# Patient Record
Sex: Female | Born: 1945
Health system: Southern US, Community
[De-identification: ages and names within clinical notes are randomized; demographics above are authoritative.]

## PROBLEM LIST (undated history)

## (undated) DIAGNOSIS — M545 Low back pain, unspecified: Secondary | ICD-10-CM

## (undated) DIAGNOSIS — I83893 Varicose veins of bilateral lower extremities with other complications: Secondary | ICD-10-CM

## (undated) DIAGNOSIS — I2510 Atherosclerotic heart disease of native coronary artery without angina pectoris: Secondary | ICD-10-CM

## (undated) DIAGNOSIS — H269 Unspecified cataract: Secondary | ICD-10-CM

## (undated) DIAGNOSIS — M8580 Other specified disorders of bone density and structure, unspecified site: Secondary | ICD-10-CM

## (undated) DIAGNOSIS — K529 Noninfective gastroenteritis and colitis, unspecified: Secondary | ICD-10-CM

## (undated) DIAGNOSIS — D120 Benign neoplasm of cecum: Secondary | ICD-10-CM

## (undated) DIAGNOSIS — R519 Headache, unspecified: Secondary | ICD-10-CM

## (undated) DIAGNOSIS — M1990 Unspecified osteoarthritis, unspecified site: Secondary | ICD-10-CM

## (undated) DIAGNOSIS — G2581 Restless legs syndrome: Secondary | ICD-10-CM

## (undated) DIAGNOSIS — K7460 Unspecified cirrhosis of liver: Secondary | ICD-10-CM

## (undated) DIAGNOSIS — K52831 Collagenous colitis: Secondary | ICD-10-CM

## (undated) DIAGNOSIS — Z972 Presence of dental prosthetic device (complete) (partial): Secondary | ICD-10-CM

## (undated) DIAGNOSIS — K5792 Diverticulitis of intestine, part unspecified, without perforation or abscess without bleeding: Secondary | ICD-10-CM

## (undated) DIAGNOSIS — I728 Aneurysm of other specified arteries: Secondary | ICD-10-CM

## (undated) DIAGNOSIS — J4520 Mild intermittent asthma, uncomplicated: Secondary | ICD-10-CM

## (undated) DIAGNOSIS — G4452 New daily persistent headache (NDPH): Secondary | ICD-10-CM

## (undated) DIAGNOSIS — K621 Rectal polyp: Secondary | ICD-10-CM

## (undated) DIAGNOSIS — R5600 Simple febrile convulsions: Secondary | ICD-10-CM

## (undated) DIAGNOSIS — I700 Atherosclerosis of aorta: Secondary | ICD-10-CM

## (undated) DIAGNOSIS — I517 Cardiomegaly: Secondary | ICD-10-CM

## (undated) DIAGNOSIS — R921 Mammographic calcification found on diagnostic imaging of breast: Secondary | ICD-10-CM

## (undated) DIAGNOSIS — Z8673 Personal history of transient ischemic attack (TIA), and cerebral infarction without residual deficits: Secondary | ICD-10-CM

## (undated) DIAGNOSIS — E785 Hyperlipidemia, unspecified: Secondary | ICD-10-CM

## (undated) HISTORY — DX: Hyperlipidemia, unspecified: E78.5

## (undated) HISTORY — DX: Other specified disorders of bone density and structure, unspecified site: M85.80

## (undated) HISTORY — PX: EYE SURGERY: SHX253

## (undated) HISTORY — DX: Unspecified cataract: H26.9

## (undated) HISTORY — PX: COLONOSCOPY: SHX174

## (undated) HISTORY — DX: Diverticulitis of intestine, part unspecified, without perforation or abscess without bleeding: K57.92

## (undated) HISTORY — PX: ABDOMINAL HYSTERECTOMY: SHX81

## (undated) HISTORY — DX: Restless legs syndrome: G25.81

## (undated) HISTORY — DX: Unspecified osteoarthritis, unspecified site: M19.90

## (undated) HISTORY — PX: TONSILLECTOMY: SUR1361

## (undated) HISTORY — PX: TUBAL LIGATION: SHX77

## (undated) HISTORY — DX: Collagenous colitis: K52.831

---

## 1997-10-04 ENCOUNTER — Other Ambulatory Visit: Admission: RE | Admit: 1997-10-04 | Discharge: 1997-10-04 | Payer: Self-pay | Admitting: Gynecology

## 1998-10-31 ENCOUNTER — Other Ambulatory Visit: Admission: RE | Admit: 1998-10-31 | Discharge: 1998-10-31 | Payer: Self-pay | Admitting: Gynecology

## 1999-12-03 ENCOUNTER — Other Ambulatory Visit: Admission: RE | Admit: 1999-12-03 | Discharge: 1999-12-03 | Payer: Self-pay | Admitting: Gynecology

## 2000-12-14 ENCOUNTER — Other Ambulatory Visit: Admission: RE | Admit: 2000-12-14 | Discharge: 2000-12-14 | Payer: Self-pay | Admitting: Gynecology

## 2001-12-14 ENCOUNTER — Other Ambulatory Visit: Admission: RE | Admit: 2001-12-14 | Discharge: 2001-12-14 | Payer: Self-pay | Admitting: Gynecology

## 2002-03-06 ENCOUNTER — Encounter: Payer: Self-pay | Admitting: Orthopedic Surgery

## 2002-03-06 ENCOUNTER — Ambulatory Visit (HOSPITAL_COMMUNITY): Admission: RE | Admit: 2002-03-06 | Discharge: 2002-03-06 | Payer: Self-pay | Admitting: Orthopedic Surgery

## 2004-09-12 ENCOUNTER — Ambulatory Visit: Payer: Self-pay | Admitting: Unknown Physician Specialty

## 2005-03-13 ENCOUNTER — Ambulatory Visit: Payer: Self-pay | Admitting: Family Medicine

## 2005-08-13 ENCOUNTER — Ambulatory Visit: Payer: Self-pay | Admitting: Family Medicine

## 2005-08-14 ENCOUNTER — Ambulatory Visit: Payer: Self-pay | Admitting: Family Medicine

## 2007-07-01 ENCOUNTER — Ambulatory Visit: Payer: Self-pay | Admitting: Family Medicine

## 2007-10-13 ENCOUNTER — Ambulatory Visit: Payer: Self-pay | Admitting: Family Medicine

## 2010-01-25 ENCOUNTER — Ambulatory Visit: Payer: Self-pay | Admitting: Family Medicine

## 2010-02-07 ENCOUNTER — Ambulatory Visit: Payer: Self-pay | Admitting: Family Medicine

## 2010-02-15 ENCOUNTER — Ambulatory Visit: Payer: Self-pay | Admitting: Unknown Physician Specialty

## 2010-02-20 LAB — PATHOLOGY REPORT

## 2010-04-10 ENCOUNTER — Ambulatory Visit: Payer: Self-pay | Admitting: Unknown Physician Specialty

## 2010-04-11 LAB — PATHOLOGY REPORT

## 2011-06-03 ENCOUNTER — Ambulatory Visit: Payer: Self-pay | Admitting: Unknown Physician Specialty

## 2011-06-09 LAB — PATHOLOGY REPORT

## 2012-06-10 ENCOUNTER — Ambulatory Visit: Payer: Self-pay | Admitting: Family Medicine

## 2012-07-06 ENCOUNTER — Ambulatory Visit: Payer: Self-pay | Admitting: Family Medicine

## 2013-10-21 ENCOUNTER — Ambulatory Visit: Payer: Self-pay | Admitting: Family Medicine

## 2014-07-30 ENCOUNTER — Other Ambulatory Visit: Payer: Self-pay | Admitting: Family Medicine

## 2014-07-30 DIAGNOSIS — J4531 Mild persistent asthma with (acute) exacerbation: Secondary | ICD-10-CM

## 2014-08-10 ENCOUNTER — Encounter: Payer: Self-pay | Admitting: Family Medicine

## 2014-08-10 ENCOUNTER — Ambulatory Visit (INDEPENDENT_AMBULATORY_CARE_PROVIDER_SITE_OTHER): Payer: Medicare PPO | Admitting: Family Medicine

## 2014-08-10 VITALS — BP 120/80 | HR 70 | Ht 67.0 in | Wt 150.0 lb

## 2014-08-10 DIAGNOSIS — J4520 Mild intermittent asthma, uncomplicated: Secondary | ICD-10-CM

## 2014-08-10 DIAGNOSIS — E785 Hyperlipidemia, unspecified: Secondary | ICD-10-CM | POA: Diagnosis not present

## 2014-08-10 DIAGNOSIS — J4531 Mild persistent asthma with (acute) exacerbation: Secondary | ICD-10-CM | POA: Diagnosis not present

## 2014-08-10 DIAGNOSIS — G2581 Restless legs syndrome: Secondary | ICD-10-CM | POA: Diagnosis not present

## 2014-08-10 DIAGNOSIS — Z0000 Encounter for general adult medical examination without abnormal findings: Secondary | ICD-10-CM

## 2014-08-10 LAB — POCT URINALYSIS DIPSTICK
Bilirubin, UA: NEGATIVE
Blood, UA: NEGATIVE
GLUCOSE UA: NEGATIVE
Ketones, UA: NEGATIVE
Leukocytes, UA: NEGATIVE
NITRITE UA: NEGATIVE
Protein, UA: NEGATIVE
Spec Grav, UA: 1.01
UROBILINOGEN UA: 0.2
pH, UA: 5

## 2014-08-10 MED ORDER — LOVASTATIN 20 MG PO TABS: 20.0000 mg | ORAL_TABLET | Freq: Every day | ORAL | Status: DC

## 2014-08-10 MED ORDER — MONTELUKAST SODIUM 10 MG PO TABS: 10.0000 mg | ORAL_TABLET | Freq: Every day | ORAL | Status: DC

## 2014-08-10 NOTE — Progress Notes (Signed)
Name: Caroline Harris   MRN: [id number]    DOB: 06-Nov-1945   Date:08/10/2014       Progress Note  Subjective  Chief Complaint  Chief Complaint  Patient presents with  . Annual Exam  . Hyperlipidemia  . Asthma    Hyperlipidemia This is a recurrent problem. The current episode started more than 1 year ago. The problem is controlled. Recent lipid tests were reviewed and are normal. She has no history of chronic renal disease, diabetes, hypothyroidism, liver disease, obesity or nephrotic syndrome. There are no known factors aggravating her hyperlipidemia. Pertinent negatives include no chest pain, focal sensory loss, focal weakness, leg pain, myalgias or shortness of breath. She is currently on no antihyperlipidemic treatment. The current treatment provides mild improvement of lipids. There are no compliance problems.  There are no known risk factors for coronary artery disease.  Asthma She complains of cough. There is no hemoptysis, shortness of breath or wheezing. Pertinent negatives include no chest pain, ear congestion, ear pain, fever, headaches, heartburn, myalgias, nasal congestion, postnasal drip, rhinorrhea, sore throat, sweats or weight loss. Her past medical history is significant for asthma.  Cough This is a recurrent problem. The current episode started more than 1 year ago. The problem has been gradually improving. The cough is non-productive. Pertinent negatives include no chest pain, chills, ear congestion, ear pain, eye redness, fever, headaches, heartburn, hemoptysis, myalgias, nasal congestion, postnasal drip, rash, rhinorrhea, sore throat, shortness of breath, sweats, weight loss or wheezing. Nothing aggravates the symptoms. She has tried nothing for the symptoms. The treatment provided mild relief. Her past medical history is significant for asthma.    No problem-specific assessment & plan notes found for this encounter.   Past Medical History  Diagnosis Date  . Asthma   .  Hyperlipidemia     History reviewed. No pertinent past surgical history.  History reviewed. No pertinent family history.  History   Social History  . Marital Status: Married    Spouse Name: N/A  . Number of Children: N/A  . Years of Education: N/A   Occupational History  . Not on file.   Social History Main Topics  . Smoking status: Current Every Day Smoker  . Smokeless tobacco: Not on file  . Alcohol Use: Not on file  . Drug Use: Not on file  . Sexual Activity: Not Currently   Other Topics Concern  . Not on file   Social History Narrative  . No narrative on file    No Known Allergies   Review of Systems  Constitutional: Negative for fever, chills and weight loss.  HENT: Negative for ear pain, postnasal drip, rhinorrhea and sore throat.   Eyes: Negative for blurred vision, double vision, photophobia, pain, discharge and redness.  Respiratory: Positive for cough. Negative for hemoptysis, shortness of breath and wheezing.   Cardiovascular: Negative for chest pain.  Gastrointestinal: Negative for heartburn, vomiting, abdominal pain, diarrhea, constipation, blood in stool and melena.  Genitourinary: Negative for dysuria, urgency and frequency.  Musculoskeletal: Negative for myalgias.  Skin: Negative for rash.  Neurological: Negative for dizziness, tingling, tremors, sensory change, focal weakness and headaches.  Endo/Heme/Allergies: Does not bruise/bleed easily.  Psychiatric/Behavioral: Negative for depression. The patient is not nervous/anxious.      Objective  Filed Vitals:   08/10/14 1414  BP: 120/80  Pulse: 70  Height: 5\' 7"  (1.702 m)  Weight: 150 lb (68.04 kg)    Physical Exam  Constitutional: She is well-developed, well-nourished, and in  no distress.  HENT:  Head: Normocephalic and atraumatic.  Right Ear: External ear normal.  Left Ear: External ear normal.  Mouth/Throat: Oropharynx is clear and moist.  Eyes: Conjunctivae and EOM are normal.  Pupils are equal, round, and reactive to light.  Neck: Normal range of motion. Neck supple. No tracheal deviation present. No thyromegaly present.  Cardiovascular: Normal rate, regular rhythm, normal heart sounds and intact distal pulses.   Pulmonary/Chest: Effort normal and breath sounds normal. No respiratory distress. She has no wheezes. She has no rales.  Abdominal: Soft. Bowel sounds are normal. There is no tenderness. There is no rebound.  Musculoskeletal: Normal range of motion. She exhibits no edema.  Lymphadenopathy:    She has no cervical adenopathy.  Skin: Skin is warm and dry.  Psychiatric: Mood and affect normal.      Assessment & Plan  Problem List Items Addressed This Visit    None    Visit Diagnoses    Annual physical exam    -  Primary    Hyperlipidemia        Relevant Medications    lovastatin (MEVACOR) 20 MG tablet    Other Relevant Orders    Lipid Profile    Hepatic function panel    Renal Function Panel         Dr. Otilio Miu [facility]  08/10/2014

## 2014-08-10 NOTE — Progress Notes (Signed)
Patient: Caroline Harris, Female    DOB: July 19, 1945, 69 y.o.   MRN: [id number] Visit Date: 08/10/2014  Today's Provider: Otilio Miu, MD   Chief Complaint  Patient presents with  . Annual Exam  . Hyperlipidemia  . Asthma    Subjective:    Annual wellness visit Caroline Harris is a 69 y.o. female who presents today for her Subsequent Annual Wellness Visit. She feels well. She reports exercising . She reports she is sleeping well.   ----------------------------------------------------------- HPI Comments: Review all aspects maw   Hyperlipidemia This is a recurrent problem. The current episode started more than 1 year ago. The problem is controlled. Recent lipid tests were reviewed and are low. She has no history of chronic renal disease, diabetes, hypothyroidism, liver disease, obesity or nephrotic syndrome. Factors aggravating her hyperlipidemia include smoking. Pertinent negatives include no chest pain, focal sensory loss, focal weakness, leg pain, myalgias or shortness of breath. She is currently on no antihyperlipidemic treatment. The current treatment provides moderate improvement of lipids. There are no compliance problems.  Risk factors for coronary artery disease include dyslipidemia.  Asthma There is no chest tightness, cough, difficulty breathing or shortness of breath. This is a recurrent problem. The current episode started more than 1 year ago. The problem occurs daily. The problem has been gradually improving. Pertinent negatives include no chest pain, fever, headaches, heartburn, myalgias, nasal congestion, PND, postnasal drip, sore throat or weight loss. Her symptoms are aggravated by nothing. Her symptoms are alleviated by ipratropium. She reports moderate improvement on treatment. Risk factors for lung disease include smoking/tobacco exposure. Her past medical history is significant for asthma.    Review of Systems  Constitutional: Negative for fever and weight loss.  HENT:  Negative for postnasal drip and sore throat.   Respiratory: Negative for cough and shortness of breath.   Cardiovascular: Negative for chest pain and PND.  Gastrointestinal: Negative for heartburn.  Musculoskeletal: Negative for myalgias.  Neurological: Negative for focal weakness and headaches.    History   Social History  . Marital Status: Married    Spouse Name: N/A  . Number of Children: N/A  . Years of Education: N/A   Occupational History  . Not on file.   Social History Main Topics  . Smoking status: Current Every Day Smoker  . Smokeless tobacco: Not on file  . Alcohol Use: Not on file  . Drug Use: Not on file  . Sexual Activity: Not Currently   Other Topics Concern  . Not on file   Social History Narrative  . No narrative on file    There are no active problems to display for this patient.   History reviewed. No pertinent past surgical history.  Her family history is not on file.    Previous Medications   ROPINIROLE (REQUIP) 0.25 MG TABLET    Take 1 tablet by mouth at bedtime.    Patient Care Team: Juline Patch, MD as PCP - General (Family Medicine)     Objective:   Vitals: BP 120/80 mmHg  Pulse 70  Ht 5\' 7"  (1.702 m)  Wt 150 lb (68.04 kg)  BMI 23.49 kg/m2  Physical Exam  Constitutional: She is oriented to person, place, and time. She appears well-developed and well-nourished.  HENT:  Head: Normocephalic.  Right Ear: External ear normal.  Left Ear: External ear normal.  Nose: Nose normal.  Eyes: Conjunctivae and EOM are normal. Pupils are equal, round, and reactive to light.  Neck: Normal  range of motion. Neck supple.  Cardiovascular: Normal rate, regular rhythm, normal heart sounds and intact distal pulses.   Pulmonary/Chest: Effort normal and breath sounds normal.  Abdominal: Soft. Bowel sounds are normal.  Genitourinary: Vagina normal and uterus normal.  Musculoskeletal: Normal range of motion.  Neurological: She is alert and oriented  to person, place, and time. She has normal reflexes.  Skin: Skin is warm and dry.  Psychiatric: She has a normal mood and affect. Her behavior is normal. Judgment and thought content normal.    Activities of Daily Living In your present state of health, do you have any difficulty performing the following activities: 08/10/2014  Hearing? N  Vision? N  Difficulty concentrating or making decisions? N  Walking or climbing stairs? N  Dressing or bathing? N  Doing errands, shopping? N    Fall Risk Assessment Fall Risk  08/10/2014  Falls in the past year? No     Patient reports there are not safety devices in place in shower at home.   Depression Screen PHQ 2/9 Scores 08/10/2014  PHQ - 2 Score 0    Cognitive Testing - 6-CIT        Assessment & Plan:     Annual Wellness Visit  Reviewed patient's Family Medical History Reviewed and updated list of patient's medical providers Assessment of cognitive impairment was done Assessed patient's functional ability Established a written schedule for health screening [place] Completed and Reviewed  Exercise Activities and Dietary recommendations Goals    None       There is no immunization history on file for this patient.  Health Maintenance  Topic Date Due  . TETANUS/TDAP  06/28/1963  . MAMMOGRAM  06/28/1994  . COLONOSCOPY  06/28/1994  . ZOSTAVAX  06/27/2004  . DEXA SCAN  06/27/2009  . PNA vac Low Risk Adult (1 of 2 - PCV13) 06/27/2009  . INFLUENZA VACCINE  09/18/2014     Discussed health benefits of physical activity, and encouraged her to engage in regular exercise appropriate for her age and condition.   ------------------------------------------------------------------------------------------------------------   Problem List Items Addressed This Visit    None    Visit Diagnoses    Medicare annual wellness visit, subsequent    -  Primary    Relevant Orders    POCT Urinalysis Dipstick  (Completed)    Hyperlipidemia        Relevant Medications    lovastatin (MEVACOR) 20 MG tablet    Other Relevant Orders    Lipid Profile    Hepatic function panel    Renal Function Panel    Reactive airway disease, mild intermittent, uncomplicated        Relevant Medications    montelukast (SINGULAIR) 10 MG tablet    Asthma, mild persistent, with acute exacerbation        Relevant Medications    montelukast (SINGULAIR) 10 MG tablet        Otilio Miu, MD [facility]  08/10/2014

## 2014-08-11 LAB — HEPATIC FUNCTION PANEL: BILIRUBIN, DIRECT: 0.12 mg/dL (ref 0.00–0.40)

## 2014-08-11 LAB — LIPID PANEL
CHOL/HDL RATIO: 2 ratio (ref 0.0–4.4)
Cholesterol, Total: 185 mg/dL (ref 100–199)
HDL: 93 mg/dL (ref >39)
LDL Calculated: 76 mg/dL (ref 0–99)
TRIGLYCERIDES: 82 mg/dL (ref 0–149)
VLDL Cholesterol Cal: 16 mg/dL (ref 5–40)

## 2014-08-11 LAB — RENAL FUNCTION PANEL: Phosphorus: 4.1 mg/dL (ref 2.5–4.5)

## 2014-10-07 ENCOUNTER — Ambulatory Visit
Admission: EM | Admit: 2014-10-07 | Discharge: 2014-10-07 | Disposition: A | Discharge status: Home / Self Care | Payer: Medicare PPO | Attending: Family Medicine | Admitting: Family Medicine

## 2014-10-07 ENCOUNTER — Encounter: Payer: Self-pay | Admitting: Gynecology

## 2014-10-07 ENCOUNTER — Ambulatory Visit: Payer: Medicare PPO

## 2014-10-07 ENCOUNTER — Other Ambulatory Visit: Payer: Self-pay

## 2014-10-07 DIAGNOSIS — R51 Headache: Secondary | ICD-10-CM | POA: Diagnosis not present

## 2014-10-07 DIAGNOSIS — R519 Headache, unspecified: Secondary | ICD-10-CM

## 2014-10-07 LAB — BASIC METABOLIC PANEL
Anion gap: 9 (ref 5–15)
BUN: 15 mg/dL (ref 6–20)
CO2: 29 mmol/L (ref 22–32)
Calcium: 9.6 mg/dL (ref 8.9–10.3)
Chloride: 96 mmol/L — ABNORMAL LOW (ref 101–111)
Creatinine, Ser: 0.67 mg/dL (ref 0.44–1.00)
GFR calc Af Amer: >60 mL/min (ref >60)
GFR calc non Af Amer: >60 mL/min (ref >60)
Glucose, Bld: 96 mg/dL (ref 65–99)
Potassium: 4.1 mmol/L (ref 3.5–5.1)
Sodium: 134 mmol/L — ABNORMAL LOW (ref 135–145)

## 2014-10-07 LAB — CBC WITH DIFFERENTIAL/PLATELET
Basophils Absolute: 0.1 10*3/uL (ref 0–0.1)
Basophils Relative: 1 %
EOS PCT: 1 %
Eosinophils Absolute: 0.1 10*3/uL (ref 0–0.7)
HEMATOCRIT: 39.5 % (ref 35.0–47.0)
Hemoglobin: 13.6 g/dL (ref 12.0–16.0)
LYMPHS PCT: 27 %
Lymphs Abs: 1.8 10*3/uL (ref 1.0–3.6)
MCH: 31.9 pg (ref 26.0–34.0)
MCHC: 34.5 g/dL (ref 32.0–36.0)
MCV: 92.5 fL (ref 80.0–100.0)
Monocytes Absolute: 0.4 10*3/uL (ref 0.2–0.9)
Monocytes Relative: 5 %
NEUTROS ABS: 4.5 10*3/uL (ref 1.4–6.5)
Neutrophils Relative %: 66 %
PLATELETS: 221 10*3/uL (ref 150–440)
RBC: 4.26 MIL/uL (ref 3.80–5.20)
RDW: 13.3 % (ref 11.5–14.5)
WBC: 6.8 10*3/uL (ref 3.6–11.0)

## 2014-10-07 LAB — URINALYSIS COMPLETE WITH MICROSCOPIC (ARMC ONLY)
BACTERIA UA: NONE SEEN — AB
Bilirubin Urine: NEGATIVE
Glucose, UA: NEGATIVE mg/dL
Hgb urine dipstick: NEGATIVE
Ketones, ur: NEGATIVE mg/dL
Leukocytes, UA: NEGATIVE
Nitrite: NEGATIVE
PROTEIN: NEGATIVE mg/dL
RBC / HPF: NONE SEEN RBC/hpf (ref <3)
Specific Gravity, Urine: 1.01 (ref 1.005–1.030)
pH: 7 (ref 5.0–8.0)

## 2014-10-07 NOTE — ED Notes (Signed)
Patient c/o headache x 1 week.

## 2014-10-07 NOTE — Discharge Instructions (Signed)
If symptoms persist or worsen seek medical attention as discussed. Discussed over-the-counter medications to use for headache.

## 2014-10-07 NOTE — ED Provider Notes (Signed)
Patient presents today after symptoms of a headache for about one week. She states that her headache is about a 4 out of 10 at this time. She denies any slurred speech, nausea, vomiting, chest pain, shortness of breath, vision changes, weakness of one side, fever, myalgias, arthralgias, rash. She denies any upper respiratory symptoms or sinus congestion. She denies any history of migraines. She has been trying to watch her diet and lose weight. She does admit to stress but does not give me specifics. She is a smoker denies any significant alcohol use. She denies any illicit drug use. She denies any tick bites.  ROS: Negative except mentioned above. Vitals as per Epic  GENERAL: NAD HEENT: no pharyngeal erythema, no exudate, no erythema of TMs, no cervical LAD RESP: CTA B CARD: RRR ABD: +BS, NT/ND, no flank tenderness MSK: 5/5 strength of extremities  NEURO: CN II-XII grossly intact, normal speech, -Rombergs, nl affect  A/P:  Headache- labs and CT of the head done today, no significant findings to explain patient's headache, I've asked that she return to her regular diet, smoking cessation encouraged, EKG was done which showed normal sinus rhythm with nonspecific T-wave abnormality, no previous EKG to compare with. Discussed use of Tylenol and Aleve for headache if needed. If symptoms do worsen I have asked that she seek immediate medical attention. She is to follow-up with her primary care provider earlier in the week.   Paulina Fusi, MD 10/07/14 [phone]

## 2014-10-09 ENCOUNTER — Ambulatory Visit (INDEPENDENT_AMBULATORY_CARE_PROVIDER_SITE_OTHER): Payer: Medicare PPO | Admitting: Family Medicine

## 2014-10-09 ENCOUNTER — Encounter: Payer: Self-pay | Admitting: Family Medicine

## 2014-10-09 VITALS — BP 120/82 | HR 72 | Ht 68.0 in | Wt 146.0 lb

## 2014-10-09 DIAGNOSIS — J0110 Acute frontal sinusitis, unspecified: Secondary | ICD-10-CM

## 2014-10-09 DIAGNOSIS — I10 Essential (primary) hypertension: Secondary | ICD-10-CM | POA: Diagnosis not present

## 2014-10-09 DIAGNOSIS — R51 Headache: Secondary | ICD-10-CM

## 2014-10-09 DIAGNOSIS — R519 Headache, unspecified: Secondary | ICD-10-CM

## 2014-10-09 MED ORDER — AZITHROMYCIN 250 MG PO TABS: ORAL_TABLET | ORAL | Status: DC

## 2014-10-09 NOTE — Progress Notes (Signed)
Name: Caroline Harris   MRN: [id number]    DOB: 1945/08/13   Date:10/09/2014       Progress Note  Subjective  Chief Complaint  Chief Complaint  Patient presents with  . Follow-up    went in with HA- all tests and labs were normal except sodium- taking extra strength Tylenol for HA    Sinusitis This is a recurrent problem. The current episode started more than 1 year ago. The problem has been waxing and waning since onset. There has been no fever. Her pain is at a severity of 4/10. The pain is moderate. Associated symptoms include headaches and sinus pressure. Pertinent negatives include no chills, congestion, coughing, diaphoresis, ear pain, hoarse voice, neck pain, shortness of breath, sneezing, sore throat or swollen glands. Past treatments include acetaminophen. The treatment provided mild relief.  Headache  This is a recurrent problem. The current episode started more than 1 year ago. The problem has been waxing and waning. The pain is located in the bilateral and frontal (occ postterior cervical) region. The pain quality is similar to prior headaches. The quality of the pain is described as throbbing. Associated symptoms include sinus pressure. Pertinent negatives include no abdominal pain, back pain, blurred vision, coughing, dizziness, ear pain, eye watering, fever, hearing loss, insomnia, muscle aches, nausea, neck pain, phonophobia, photophobia, rhinorrhea, sore throat, swollen glands, tingling, tinnitus, visual change or weight loss. Nothing aggravates the symptoms. She has tried acetaminophen for the symptoms. Her past medical history is significant for hypertension and sinus disease. There is no history of cancer, cluster headaches, migraine headaches, obesity, recent head traumas or TMJ.    No problem-specific assessment & plan notes found for this encounter.   Past Medical History  Diagnosis Date  . Asthma   . Hyperlipidemia   . Hypertension   . Restless leg     Past Surgical  History  Procedure Laterality Date  . Abdominal hysterectomy    . Tonsillectomy    . Colonoscopy      History reviewed. No pertinent family history.  Social History   Social History  . Marital Status: Married    Spouse Name: N/A  . Number of Children: N/A  . Years of Education: N/A   Occupational History  . Not on file.   Social History Main Topics  . Smoking status: Current Every Day Smoker  . Smokeless tobacco: Not on file  . Alcohol Use: 0.6 oz/week    1 Glasses of wine per week  . Drug Use: No  . Sexual Activity: Not Currently   Other Topics Concern  . Not on file   Social History Narrative    No Known Allergies   Review of Systems  Constitutional: Negative for fever, chills, weight loss, malaise/fatigue and diaphoresis.  HENT: Positive for sinus pressure. Negative for congestion, ear discharge, ear pain, hearing loss, hoarse voice, rhinorrhea, sneezing, sore throat and tinnitus.   Eyes: Negative for blurred vision and photophobia.  Respiratory: Negative for cough, sputum production, shortness of breath and wheezing.   Cardiovascular: Negative for chest pain, palpitations and leg swelling.  Gastrointestinal: Negative for heartburn, nausea, abdominal pain, diarrhea, constipation, blood in stool and melena.  Genitourinary: Negative for dysuria, urgency, frequency and hematuria.  Musculoskeletal: Negative for myalgias, back pain, joint pain and neck pain.  Skin: Negative for rash.  Neurological: Positive for headaches. Negative for dizziness, tingling, sensory change and focal weakness.  Endo/Heme/Allergies: Negative for environmental allergies and polydipsia. Does not bruise/bleed easily.  Psychiatric/Behavioral:  Negative for depression and suicidal ideas. The patient is not nervous/anxious and does not have insomnia.      Objective  Filed Vitals:   10/09/14 1444  BP: 120/82  Pulse: 72  Height: 5\' 8"  (1.727 m)  Weight: 146 lb (66.225 kg)    Physical  Exam  Constitutional: She is well-developed, well-nourished, and in no distress. No distress.  HENT:  Head: Normocephalic and atraumatic.  Right Ear: External ear normal.  Left Ear: External ear normal.  Nose: Nose normal.  Mouth/Throat: Oropharynx is clear and moist.  Eyes: Conjunctivae and EOM are normal. Pupils are equal, round, and reactive to light. Right eye exhibits no discharge. Left eye exhibits no discharge.  Neck: Normal range of motion. Neck supple. No JVD present. No thyromegaly present.  Cardiovascular: Normal rate, regular rhythm, normal heart sounds and intact distal pulses.  Exam reveals no gallop and no friction rub.   No murmur heard. Pulmonary/Chest: Effort normal and breath sounds normal.  Abdominal: Soft. Bowel sounds are normal. She exhibits no mass. There is no tenderness. There is no guarding.  Musculoskeletal: Normal range of motion. She exhibits no edema.  Lymphadenopathy:    She has no cervical adenopathy.  Neurological: She is alert. She has normal sensation, normal strength, normal reflexes and intact cranial nerves.  Skin: Skin is warm and dry. She is not diaphoretic.  Psychiatric: Mood and affect normal.      Assessment & Plan  Problem List Items Addressed This Visit    None        Dr. Otilio Miu [facility]  10/09/2014

## 2014-12-25 ENCOUNTER — Encounter: Payer: Self-pay | Admitting: Family Medicine

## 2014-12-25 ENCOUNTER — Ambulatory Visit (INDEPENDENT_AMBULATORY_CARE_PROVIDER_SITE_OTHER): Payer: Medicare PPO | Admitting: Family Medicine

## 2014-12-25 VITALS — BP 110/60 | HR 68 | Temp 98.0°F | Ht 68.0 in | Wt 150.0 lb

## 2014-12-25 DIAGNOSIS — J0100 Acute maxillary sinusitis, unspecified: Secondary | ICD-10-CM

## 2014-12-25 MED ORDER — GUAIFENESIN-CODEINE 100-10 MG/5ML PO SOLN: 5.0000 mL | Freq: Three times a day (TID) | ORAL | Status: DC | PRN

## 2014-12-25 MED ORDER — AMOXICILLIN 500 MG PO CAPS: 500.0000 mg | ORAL_CAPSULE | Freq: Three times a day (TID) | ORAL | Status: DC

## 2014-12-25 NOTE — Progress Notes (Signed)
Name: Caroline Harris   MRN: [id number]    DOB: 15-Sep-1945   Date:12/25/2014       Progress Note  Subjective  Chief Complaint  Chief Complaint  Patient presents with  . Sinusitis    facial pressure- taking Mucinex x 5 days- not helping  . Cough    Sinusitis This is a chronic problem. The current episode started in the past 7 days. The problem is unchanged. There has been no fever. The pain is moderate (maxillary). Associated symptoms include coughing. Pertinent negatives include no chills, congestion, diaphoresis, ear pain, headaches, hoarse voice, neck pain, shortness of breath, sinus pressure, sneezing, sore throat or swollen glands. Past treatments include acetaminophen. The treatment provided mild relief.  Cough This is a recurrent problem. The current episode started more than 1 month ago. The problem has been waxing and waning. The cough is non-productive. Pertinent negatives include no chest pain, chills, ear pain, fever, headaches, heartburn, hemoptysis, myalgias, nasal congestion, postnasal drip, rash, rhinorrhea, sore throat, shortness of breath, weight loss or wheezing. The treatment provided mild relief. There is no history of asthma, bronchiectasis, bronchitis, COPD, emphysema, environmental allergies or pneumonia.    No problem-specific assessment & plan notes found for this encounter.   Past Medical History  Diagnosis Date  . Asthma   . Hyperlipidemia   . Hypertension   . Restless leg     Past Surgical History  Procedure Laterality Date  . Abdominal hysterectomy    . Tonsillectomy    . Colonoscopy      History reviewed. No pertinent family history.  Social History   Social History  . Marital Status: Married    Spouse Name: N/A  . Number of Children: N/A  . Years of Education: N/A   Occupational History  . Not on file.   Social History Main Topics  . Smoking status: Current Every Day Smoker  . Smokeless tobacco: Not on file  . Alcohol Use: 0.6 oz/week     1 Glasses of wine per week  . Drug Use: No  . Sexual Activity: Not Currently   Other Topics Concern  . Not on file   Social History Narrative    No Known Allergies   Review of Systems  Constitutional: Negative for fever, chills, weight loss, malaise/fatigue and diaphoresis.  HENT: Negative for congestion, ear discharge, ear pain, hoarse voice, postnasal drip, rhinorrhea, sinus pressure, sneezing and sore throat.   Eyes: Negative for blurred vision.  Respiratory: Positive for cough. Negative for hemoptysis, sputum production, shortness of breath and wheezing.   Cardiovascular: Negative for chest pain, palpitations and leg swelling.  Gastrointestinal: Negative for heartburn, nausea, abdominal pain, diarrhea, constipation, blood in stool and melena.  Genitourinary: Negative for dysuria, urgency, frequency and hematuria.  Musculoskeletal: Negative for myalgias, back pain, joint pain and neck pain.  Skin: Negative for rash.  Neurological: Negative for dizziness, tingling, sensory change, focal weakness and headaches.  Endo/Heme/Allergies: Negative for environmental allergies and polydipsia. Does not bruise/bleed easily.  Psychiatric/Behavioral: Negative for depression and suicidal ideas. The patient is not nervous/anxious and does not have insomnia.      Objective  Filed Vitals:   12/25/14 1514  BP: 110/60  Pulse: 68  Temp: 98 F (36.7 C)  TempSrc: Oral  Height: 5\' 8"  (1.727 m)  Weight: 150 lb (68.04 kg)  SpO2: 96%    Physical Exam  Constitutional: She is well-developed, well-nourished, and in no distress. No distress.  HENT:  Head: Normocephalic and atraumatic.  Right Ear: External ear normal.  Left Ear: External ear normal.  Nose: Nose normal.  Mouth/Throat: Oropharynx is clear and moist.  Eyes: Conjunctivae and EOM are normal. Pupils are equal, round, and reactive to light. Right eye exhibits no discharge. Left eye exhibits no discharge.  Neck: Normal range of  motion. Neck supple. No JVD present. No thyromegaly present.  Cardiovascular: Normal rate, regular rhythm, normal heart sounds and intact distal pulses.  Exam reveals no gallop and no friction rub.   No murmur heard. Pulmonary/Chest: Effort normal and breath sounds normal.  Abdominal: Soft. Bowel sounds are normal. She exhibits no mass. There is no tenderness. There is no guarding.  Musculoskeletal: Normal range of motion. She exhibits no edema.  Lymphadenopathy:    She has no cervical adenopathy.  Neurological: She is alert. She has normal reflexes.  Skin: Skin is warm and dry. She is not diaphoretic.  Psychiatric: Mood and affect normal.      Assessment & Plan  Problem List Items Addressed This Visit    None    Visit Diagnoses    Acute maxillary sinusitis, recurrence not specified    -  Primary    Relevant Medications    amoxicillin (AMOXIL) 500 MG capsule    guaiFENesin-codeine 100-10 MG/5ML syrup         Dr. Alline Pio [facility]  12/25/2014

## 2014-12-28 ENCOUNTER — Ambulatory Visit (INDEPENDENT_AMBULATORY_CARE_PROVIDER_SITE_OTHER): Payer: Medicare PPO | Admitting: Family Medicine

## 2014-12-28 ENCOUNTER — Ambulatory Visit
Admission: RE | Admit: 2014-12-28 | Discharge: 2014-12-28 | Disposition: A | Discharge status: Home / Self Care | Payer: Medicare PPO | Source: Ambulatory Visit | Attending: Family Medicine | Admitting: Family Medicine

## 2014-12-28 ENCOUNTER — Encounter: Payer: Self-pay | Admitting: Family Medicine

## 2014-12-28 VITALS — BP 120/80 | HR 72 | Temp 98.1°F | Ht 68.0 in | Wt 149.0 lb

## 2014-12-28 DIAGNOSIS — J219 Acute bronchiolitis, unspecified: Secondary | ICD-10-CM | POA: Diagnosis present

## 2014-12-28 DIAGNOSIS — J410 Simple chronic bronchitis: Secondary | ICD-10-CM | POA: Diagnosis not present

## 2014-12-28 DIAGNOSIS — J449 Chronic obstructive pulmonary disease, unspecified: Secondary | ICD-10-CM | POA: Insufficient documentation

## 2014-12-28 MED ORDER — LEVOFLOXACIN 500 MG PO TABS: 500.0000 mg | ORAL_TABLET | Freq: Every day | ORAL | Status: DC

## 2014-12-28 NOTE — Progress Notes (Signed)
Name: Caroline Harris   MRN: [id number]    DOB: 1945/05/31   Date:12/28/2014       Progress Note  Subjective  Chief Complaint  Chief Complaint  Patient presents with  . Bronchitis    taking Amox and Rob AC- cough is worse    Cough This is a recurrent problem. The current episode started 1 to 4 weeks ago. The problem has been unchanged. The problem occurs every few minutes. The cough is non-productive. Associated symptoms include chest pain and postnasal drip. Pertinent negatives include no chills, ear congestion, ear pain, fever, headaches, heartburn, hemoptysis, myalgias, nasal congestion, rash, rhinorrhea, sore throat, shortness of breath, sweats, weight loss or wheezing. Associated symptoms comments: Lower chest/abdominal. The symptoms are aggravated by pollens and cold air. Risk factors for lung disease include smoking/tobacco exposure. She has tried leukotriene antagonists for the symptoms. The treatment provided mild relief. There is no history of asthma, bronchiectasis, bronchitis, COPD, emphysema, environmental allergies or pneumonia.    No problem-specific assessment & plan notes found for this encounter.   Past Medical History  Diagnosis Date  . Asthma   . Hyperlipidemia   . Hypertension   . Restless leg     Past Surgical History  Procedure Laterality Date  . Abdominal hysterectomy    . Tonsillectomy    . Colonoscopy      History reviewed. No pertinent family history.  Social History   Social History  . Marital Status: Married    Spouse Name: N/A  . Number of Children: N/A  . Years of Education: N/A   Occupational History  . Not on file.   Social History Main Topics  . Smoking status: Current Every Day Smoker  . Smokeless tobacco: Not on file  . Alcohol Use: 0.6 oz/week    1 Glasses of wine per week  . Drug Use: No  . Sexual Activity: Not Currently   Other Topics Concern  . Not on file   Social History Narrative    No Known Allergies   Review of  Systems  Constitutional: Negative for fever, chills and weight loss.  HENT: Positive for postnasal drip. Negative for ear pain, rhinorrhea and sore throat.   Respiratory: Positive for cough. Negative for hemoptysis, shortness of breath and wheezing.   Cardiovascular: Positive for chest pain.  Gastrointestinal: Negative for heartburn.  Musculoskeletal: Negative for myalgias.  Skin: Negative for rash.  Neurological: Negative for headaches.  Endo/Heme/Allergies: Negative for environmental allergies.     Objective  Filed Vitals:   12/28/14 1011  BP: 120/80  Pulse: 72  Temp: 98.1 F (36.7 C)  TempSrc: Oral  Height: 5\' 8"  (1.727 m)  Weight: 149 lb (67.586 kg)    Physical Exam  Constitutional: She is well-developed, well-nourished, and in no distress. No distress.  HENT:  Head: Normocephalic and atraumatic.  Right Ear: External ear normal.  Left Ear: External ear normal.  Nose: Nose normal.  Mouth/Throat: Oropharynx is clear and moist.  Eyes: Conjunctivae and EOM are normal. Pupils are equal, round, and reactive to light. Right eye exhibits no discharge. Left eye exhibits no discharge.  Neck: Normal range of motion. Neck supple. No JVD present. No thyromegaly present.  Cardiovascular: Normal rate, regular rhythm, normal heart sounds and intact distal pulses.  Exam reveals no gallop and no friction rub.   No murmur heard. Pulmonary/Chest: Effort normal and breath sounds normal. No respiratory distress. She has no wheezes. She has no rales. She exhibits no tenderness.  Abdominal:  Soft. Bowel sounds are normal.  Musculoskeletal: Normal range of motion. She exhibits no edema.  Lymphadenopathy:    She has no cervical adenopathy.  Neurological: She is alert.  Skin: Skin is warm and dry. She is not diaphoretic.  Psychiatric: Mood and affect normal.  Nursing note and vitals reviewed.     Assessment & Plan  Problem List Items Addressed This Visit    None    Visit Diagnoses     Bronchiolitis    -  Primary    breo sample    Relevant Medications    levofloxacin (LEVAQUIN) 500 MG tablet    Other Relevant Orders    DG Chest 2 View    Simple chronic bronchitis ([place])        Relevant Medications    levofloxacin (LEVAQUIN) 500 MG tablet         Dr. Mac[facility]  12/28/2014

## 2015-01-04 ENCOUNTER — Encounter: Payer: Self-pay | Admitting: Family Medicine

## 2015-01-04 ENCOUNTER — Ambulatory Visit (INDEPENDENT_AMBULATORY_CARE_PROVIDER_SITE_OTHER): Payer: Medicare PPO | Admitting: Family Medicine

## 2015-01-04 VITALS — BP 120/70 | HR 72 | Ht 68.0 in | Wt 149.0 lb

## 2015-01-04 DIAGNOSIS — J441 Chronic obstructive pulmonary disease with (acute) exacerbation: Secondary | ICD-10-CM

## 2015-01-04 DIAGNOSIS — Z23 Encounter for immunization: Secondary | ICD-10-CM | POA: Diagnosis not present

## 2015-01-04 MED ORDER — AZITHROMYCIN 250 MG PO TABS: ORAL_TABLET | ORAL | Status: DC

## 2015-01-04 MED ORDER — ALBUTEROL SULFATE HFA 108 (90 BASE) MCG/ACT IN AERS: 2.0000 | INHALATION_SPRAY | Freq: Four times a day (QID) | RESPIRATORY_TRACT | Status: DC | PRN

## 2015-01-04 NOTE — Progress Notes (Signed)
Name: Caroline Harris   MRN: [id number]    DOB: April 08, 1945   Date:01/04/2015       Progress Note  Subjective  Chief Complaint  Chief Complaint  Patient presents with  . Cough    Cough This is a recurrent problem. The current episode started 1 to 4 weeks ago. The problem has been waxing and waning. The problem occurs every few minutes. The cough is non-productive. Associated symptoms include shortness of breath and wheezing. Pertinent negatives include no chest pain, chills, ear congestion, ear pain, fever, headaches, heartburn, hemoptysis, myalgias, nasal congestion, postnasal drip, rash, rhinorrhea, sore throat, sweats or weight loss. The symptoms are aggravated by cold air. She has tried a beta-agonist inhaler and steroid inhaler for the symptoms. The treatment provided mild relief. Her past medical history is significant for asthma, COPD and emphysema. There is no history of bronchiectasis, bronchitis, environmental allergies or pneumonia.    No problem-specific assessment & plan notes found for this encounter.   Past Medical History  Diagnosis Date  . Asthma   . Hyperlipidemia   . Hypertension   . Restless leg     Past Surgical History  Procedure Laterality Date  . Abdominal hysterectomy    . Tonsillectomy    . Colonoscopy      History reviewed. No pertinent family history.  Social History   Social History  . Marital Status: Married    Spouse Name: N/A  . Number of Children: N/A  . Years of Education: N/A   Occupational History  . Not on file.   Social History Main Topics  . Smoking status: Current Every Day Smoker  . Smokeless tobacco: Not on file  . Alcohol Use: 0.6 oz/week    1 Glasses of wine per week  . Drug Use: No  . Sexual Activity: Not Currently   Other Topics Concern  . Not on file   Social History Narrative    No Known Allergies   Review of Systems  Constitutional: Negative for fever, chills, weight loss and malaise/fatigue.  HENT:  Negative for ear discharge, ear pain, postnasal drip, rhinorrhea and sore throat.   Eyes: Negative for blurred vision.  Respiratory: Positive for cough, shortness of breath and wheezing. Negative for hemoptysis and sputum production.   Cardiovascular: Negative for chest pain, palpitations and leg swelling.  Gastrointestinal: Negative for heartburn, nausea, abdominal pain, diarrhea, constipation, blood in stool and melena.  Genitourinary: Negative for dysuria, urgency, frequency and hematuria.  Musculoskeletal: Negative for myalgias, back pain, joint pain and neck pain.  Skin: Negative for rash.  Neurological: Negative for dizziness, tingling, sensory change, focal weakness and headaches.  Endo/Heme/Allergies: Negative for environmental allergies and polydipsia. Does not bruise/bleed easily.  Psychiatric/Behavioral: Negative for depression and suicidal ideas. The patient is not nervous/anxious and does not have insomnia.      Objective  Filed Vitals:   01/04/15 1029  BP: 120/70  Pulse: 72  Height: 5\' 8"  (1.727 m)  Weight: 149 lb (67.586 kg)  SpO2: 96%    Physical Exam  Constitutional: She is well-developed, well-nourished, and in no distress. No distress.  HENT:  Head: Normocephalic and atraumatic.  Right Ear: External ear normal.  Left Ear: External ear normal.  Nose: Nose normal.  Mouth/Throat: Oropharynx is clear and moist.  Eyes: Conjunctivae and EOM are normal. Pupils are equal, round, and reactive to light. Right eye exhibits no discharge. Left eye exhibits no discharge.  Neck: Normal range of motion. Neck supple. No JVD present.  No thyromegaly present.  Cardiovascular: Normal rate, regular rhythm, normal heart sounds and intact distal pulses.  Exam reveals no gallop and no friction rub.   No murmur heard. Pulmonary/Chest: Effort normal. No respiratory distress. She has wheezes. She has no rales.  Abdominal: Soft. Bowel sounds are normal. She exhibits no mass. There is no  tenderness. There is no guarding.  Musculoskeletal: Normal range of motion. She exhibits no edema.  Lymphadenopathy:    She has no cervical adenopathy.  Neurological: She is alert.  Skin: Skin is warm and dry. She is not diaphoretic.  Psychiatric: Mood and affect normal.      Assessment & Plan  Problem List Items Addressed This Visit    None    Visit Diagnoses    Chronic obstructive pulmonary disease with acute exacerbation (HCC)    -  Primary    Relevant Medications    albuterol (PROVENTIL HFA;VENTOLIN HFA) 108 (90 BASE) MCG/ACT inhaler    azithromycin (ZITHROMAX) 250 MG tablet    Need for influenza vaccination        Relevant Orders    Flu Vaccine QUAD 36+ mos PF IM (Fluarix & Fluzone Quad PF) (Completed)         Dr. Demaree Liberto [facility]  01/04/2015

## 2015-04-22 ENCOUNTER — Other Ambulatory Visit: Payer: Self-pay | Admitting: Family Medicine

## 2015-05-07 ENCOUNTER — Other Ambulatory Visit: Payer: Self-pay | Admitting: Family Medicine

## 2015-05-25 ENCOUNTER — Other Ambulatory Visit: Payer: Self-pay | Admitting: Family Medicine

## 2015-06-07 ENCOUNTER — Ambulatory Visit (INDEPENDENT_AMBULATORY_CARE_PROVIDER_SITE_OTHER): Payer: Medicare PPO | Admitting: Family Medicine

## 2015-06-07 ENCOUNTER — Encounter: Payer: Self-pay | Admitting: Family Medicine

## 2015-06-07 VITALS — BP 120/82 | HR 80 | Ht 68.0 in | Wt 150.0 lb

## 2015-06-07 DIAGNOSIS — J441 Chronic obstructive pulmonary disease with (acute) exacerbation: Secondary | ICD-10-CM | POA: Diagnosis not present

## 2015-06-07 DIAGNOSIS — J4520 Mild intermittent asthma, uncomplicated: Secondary | ICD-10-CM | POA: Insufficient documentation

## 2015-06-07 DIAGNOSIS — E785 Hyperlipidemia, unspecified: Secondary | ICD-10-CM | POA: Diagnosis not present

## 2015-06-07 DIAGNOSIS — G2581 Restless legs syndrome: Secondary | ICD-10-CM

## 2015-06-07 MED ORDER — MONTELUKAST SODIUM 10 MG PO TABS: 10.0000 mg | ORAL_TABLET | Freq: Every day | ORAL | Status: DC

## 2015-06-07 MED ORDER — ALBUTEROL SULFATE HFA 108 (90 BASE) MCG/ACT IN AERS: 2.0000 | INHALATION_SPRAY | Freq: Four times a day (QID) | RESPIRATORY_TRACT | Status: DC | PRN

## 2015-06-07 MED ORDER — ROPINIROLE HCL 0.25 MG PO TABS: 0.2500 mg | ORAL_TABLET | Freq: Every day | ORAL | Status: DC

## 2015-06-07 MED ORDER — LOVASTATIN 20 MG PO TABS: 20.0000 mg | ORAL_TABLET | Freq: Every day | ORAL | Status: DC

## 2015-06-07 NOTE — Progress Notes (Signed)
Name: Caroline Harris   MRN: [id number]    DOB: 10/14/1945   Date:06/07/2015       Progress Note  Subjective  Chief Complaint  Chief Complaint  Patient presents with  . Hyperlipidemia  . Asthma  . restless leg    HPI Comments: Patient presents for refill restless leg.  Hyperlipidemia This is a chronic problem. The current episode started more than 1 year ago. The problem is controlled. Recent lipid tests were reviewed and are normal. She has no history of chronic renal disease, diabetes, hypothyroidism, liver disease, obesity or nephrotic syndrome. There are no known factors aggravating her hyperlipidemia. Pertinent negatives include no chest pain, focal sensory loss, focal weakness, leg pain, myalgias or shortness of breath. She is currently on no antihyperlipidemic treatment. The current treatment provides mild improvement of lipids. There are no compliance problems.  There are no known risk factors for coronary artery disease.  Asthma There is no chest tightness, cough, difficulty breathing, frequent throat clearing, hemoptysis, hoarse voice, shortness of breath, sputum production or wheezing. This is a new problem. The current episode started more than 1 year ago. The problem occurs intermittently. The problem has been gradually improving. Pertinent negatives include no appetite change, chest pain, dyspnea on exertion, ear congestion, ear pain, fever, headaches, heartburn, malaise/fatigue, myalgias, nasal congestion, orthopnea, PND, postnasal drip, rhinorrhea, sneezing, sore throat, sweats, trouble swallowing or weight loss. Her symptoms are aggravated by nothing. Her symptoms are alleviated by beta-agonist and leukotriene antagonist. She reports moderate improvement on treatment. Her past medical history is significant for asthma.    No problem-specific assessment & plan notes found for this encounter.   Past Medical History  Diagnosis Date  . Asthma   . Hyperlipidemia   . Hypertension    . Restless leg     Past Surgical History  Procedure Laterality Date  . Abdominal hysterectomy    . Tonsillectomy    . Colonoscopy      History reviewed. No pertinent family history.  Social History   Social History  . Marital Status: Married    Spouse Name: N/A  . Number of Children: N/A  . Years of Education: N/A   Occupational History  . Not on file.   Social History Main Topics  . Smoking status: Current Every Day Smoker  . Smokeless tobacco: Not on file  . Alcohol Use: 0.6 oz/week    1 Glasses of wine per week  . Drug Use: No  . Sexual Activity: Not Currently   Other Topics Concern  . Not on file   Social History Narrative    No Known Allergies   Review of Systems  Constitutional: Negative for fever, chills, weight loss, malaise/fatigue and appetite change.  HENT: Negative for ear discharge, ear pain, hoarse voice, postnasal drip, rhinorrhea, sneezing, sore throat and trouble swallowing.   Eyes: Negative for blurred vision.  Respiratory: Negative for cough, hemoptysis, sputum production, shortness of breath and wheezing.   Cardiovascular: Negative for chest pain, dyspnea on exertion, palpitations, leg swelling and PND.  Gastrointestinal: Negative for heartburn, nausea, abdominal pain, diarrhea, constipation, blood in stool and melena.  Genitourinary: Negative for dysuria, urgency, frequency and hematuria.  Musculoskeletal: Negative for myalgias, back pain, joint pain and neck pain.  Skin: Negative for rash.  Neurological: Negative for dizziness, tingling, sensory change, focal weakness and headaches.  Endo/Heme/Allergies: Negative for environmental allergies and polydipsia. Does not bruise/bleed easily.  Psychiatric/Behavioral: Negative for depression and suicidal ideas. The patient is not nervous/anxious  and does not have insomnia.      Objective  Filed Vitals:   06/07/15 0758  BP: 120/82  Pulse: 80  Height: 5\' 8"  (1.727 m)  Weight: 150 lb (68.04  kg)    Physical Exam  Constitutional: She is well-developed, well-nourished, and in no distress. No distress.  HENT:  Head: Normocephalic and atraumatic.  Right Ear: External ear normal.  Left Ear: External ear normal.  Nose: Nose normal.  Mouth/Throat: Oropharynx is clear and moist.  Eyes: Conjunctivae and EOM are normal. Pupils are equal, round, and reactive to light. Right eye exhibits no discharge. Left eye exhibits no discharge.  Neck: Normal range of motion. Neck supple. No JVD present. No thyromegaly present.  Cardiovascular: Normal rate, regular rhythm, normal heart sounds and intact distal pulses.  Exam reveals no gallop and no friction rub.   No murmur heard. Pulmonary/Chest: Effort normal and breath sounds normal.  Abdominal: Soft. Bowel sounds are normal. She exhibits no mass. There is no tenderness. There is no guarding.  Musculoskeletal: Normal range of motion. She exhibits no edema.  Lymphadenopathy:    She has no cervical adenopathy.  Neurological: She is alert.  Skin: Skin is warm and dry. She is not diaphoretic.  Psychiatric: Mood and affect normal.      Assessment & Plan  Problem List Items Addressed This Visit      Respiratory   Asthma, mild intermittent   Relevant Medications   albuterol (PROVENTIL HFA;VENTOLIN HFA) 108 (90 Base) MCG/ACT inhaler   montelukast (SINGULAIR) 10 MG tablet     Other   Restless leg syndrome   Relevant Medications   rOPINIRole (REQUIP) 0.25 MG tablet   Hyperlipidemia - Primary   Relevant Medications   lovastatin (MEVACOR) 20 MG tablet   Other Relevant Orders   Lipid Profile    Other Visit Diagnoses    Chronic obstructive pulmonary disease with acute exacerbation (HCC)        Relevant Medications    albuterol (PROVENTIL HFA;VENTOLIN HFA) 108 (90 Base) MCG/ACT inhaler    montelukast (SINGULAIR) 10 MG tablet         Dr. Deanna Jones [facility]  06/07/2015

## 2015-06-08 LAB — LIPID PANEL
CHOLESTEROL TOTAL: 183 mg/dL (ref 100–199)
Chol/HDL Ratio: 2 ratio units (ref 0.0–4.4)
HDL: 92 mg/dL (ref >39)
LDL CALC: 74 mg/dL (ref 0–99)
Triglycerides: 87 mg/dL (ref 0–149)
VLDL CHOLESTEROL CAL: 17 mg/dL (ref 5–40)

## 2015-08-06 ENCOUNTER — Ambulatory Visit (INDEPENDENT_AMBULATORY_CARE_PROVIDER_SITE_OTHER): Payer: Medicare PPO | Admitting: Family Medicine

## 2015-08-06 ENCOUNTER — Encounter: Payer: Self-pay | Admitting: Family Medicine

## 2015-08-06 VITALS — BP 118/80 | HR 70 | Ht 68.0 in | Wt 150.0 lb

## 2015-08-06 DIAGNOSIS — J0100 Acute maxillary sinusitis, unspecified: Secondary | ICD-10-CM

## 2015-08-06 MED ORDER — GUAIFENESIN-CODEINE 100-10 MG/5ML PO SYRP: 5.0000 mL | ORAL_SOLUTION | Freq: Three times a day (TID) | ORAL | Status: DC | PRN

## 2015-08-06 MED ORDER — AMOXICILLIN-POT CLAVULANATE 875-125 MG PO TABS: 1.0000 | ORAL_TABLET | Freq: Two times a day (BID) | ORAL | Status: DC

## 2015-08-06 NOTE — Progress Notes (Signed)
Name: Caroline Harris   MRN: [id number]    DOB: [birth year]   Date:08/06/2015       Progress Note  Subjective  Chief Complaint  Chief Complaint  Patient presents with  . Sinusitis    Sinusitis This is a new problem. The current episode started in the past 7 days. The problem has been gradually worsening since onset. There has been no fever. She is experiencing no pain. Associated symptoms include sinus pressure. Pertinent negatives include no chills, congestion, coughing, diaphoresis, ear pain, headaches, neck pain, shortness of breath, sore throat or swollen glands. Treatments tried: flonase at home. The treatment provided moderate relief.    No problem-specific assessment & plan notes found for this encounter.   Past Medical History  Diagnosis Date  . Asthma   . Hyperlipidemia   . Hypertension   . Restless leg     Past Surgical History  Procedure Laterality Date  . Abdominal hysterectomy    . Tonsillectomy    . Colonoscopy      History reviewed. No pertinent family history.  Social History   Social History  . Marital Status: Married    Spouse Name: N/A  . Number of Children: N/A  . Years of Education: N/A   Occupational History  . Not on file.   Social History Main Topics  . Smoking status: Current Every Day Smoker  . Smokeless tobacco: Not on file  . Alcohol Use: 0.6 oz/week    1 Glasses of wine per week  . Drug Use: No  . Sexual Activity: Not Currently   Other Topics Concern  . Not on file   Social History Narrative    No Known Allergies   Review of Systems  Constitutional: Negative for fever, chills, weight loss, malaise/fatigue and diaphoresis.  HENT: Positive for sinus pressure. Negative for congestion, ear discharge, ear pain and sore throat.   Eyes: Negative for blurred vision.  Respiratory: Negative for cough, sputum production, shortness of breath and wheezing.   Cardiovascular: Negative for chest pain, palpitations and leg swelling.   Gastrointestinal: Negative for heartburn, nausea, abdominal pain, diarrhea, constipation, blood in stool and melena.  Genitourinary: Negative for dysuria, urgency, frequency and hematuria.  Musculoskeletal: Negative for myalgias, back pain, joint pain and neck pain.  Skin: Negative for rash.  Neurological: Negative for dizziness, tingling, sensory change, focal weakness and headaches.  Endo/Heme/Allergies: Negative for environmental allergies and polydipsia. Does not bruise/bleed easily.  Psychiatric/Behavioral: Negative for depression and suicidal ideas. The patient is not nervous/anxious and does not have insomnia.      Objective  Filed Vitals:   08/06/15 1327  BP: 118/80  Pulse: 70  Height: 5\' 8"  (1.727 m)  Weight: 150 lb (68.04 kg)    Physical Exam  Constitutional: She is well-developed, well-nourished, and in no distress. No distress.  HENT:  Head: Normocephalic and atraumatic.  Right Ear: External ear normal.  Left Ear: External ear normal.  Nose: Nose normal.  Mouth/Throat: Oropharynx is clear and moist.  Eyes: Conjunctivae and EOM are normal. Pupils are equal, round, and reactive to light. Right eye exhibits no discharge. Left eye exhibits no discharge.  Neck: Normal range of motion. Neck supple. No JVD present. No thyromegaly present.  Cardiovascular: Normal rate, regular rhythm, normal heart sounds and intact distal pulses.  Exam reveals no gallop and no friction rub.   No murmur heard. Pulmonary/Chest: Effort normal and breath sounds normal.  Abdominal: Soft. Bowel sounds are normal. She exhibits no mass. There  is no tenderness. There is no guarding.  Musculoskeletal: Normal range of motion. She exhibits no edema.  Lymphadenopathy:    She has no cervical adenopathy.  Neurological: She is alert. She has normal reflexes.  Skin: Skin is warm and dry. She is not diaphoretic.  Psychiatric: Mood and affect normal.  Nursing note and vitals reviewed.     Assessment &  Plan  Problem List Items Addressed This Visit    None    Visit Diagnoses    Acute maxillary sinusitis, recurrence not specified    -  Primary    Relevant Medications    amoxicillin-clavulanate (AUGMENTIN) 875-125 MG tablet    guaiFENesin-codeine (ROBITUSSIN AC) 100-10 MG/5ML syrup         Dr. Jovante Hammitt [facility]  08/06/2015

## 2015-08-13 ENCOUNTER — Ambulatory Visit (INDEPENDENT_AMBULATORY_CARE_PROVIDER_SITE_OTHER): Payer: Medicare PPO | Admitting: Family Medicine

## 2015-08-13 ENCOUNTER — Encounter: Payer: Self-pay | Admitting: Family Medicine

## 2015-08-13 VITALS — BP 120/80 | HR 78 | Ht 68.0 in | Wt 150.0 lb

## 2015-08-13 DIAGNOSIS — Z23 Encounter for immunization: Secondary | ICD-10-CM

## 2015-08-13 DIAGNOSIS — J4520 Mild intermittent asthma, uncomplicated: Secondary | ICD-10-CM | POA: Diagnosis not present

## 2015-08-13 NOTE — Progress Notes (Signed)
Name: Caroline Harris   MRN: [id number]    DOB: 1946/02/05   Date:08/13/2015       Progress Note  Subjective  Chief Complaint  Chief Complaint  Patient presents with  . Bronchitis    follow up- has 3 days left on Augmentin- not better- using the inhaler and the cough syrup doesn't seem to help much, so she stopped taking it    Cough This is a recurrent problem. The current episode started in the past 7 days. The problem has been waxing and waning. The cough is non-productive. Pertinent negatives include no chest pain, chills, ear congestion, ear pain, fever, headaches, heartburn, hemoptysis, myalgias, nasal congestion, postnasal drip, rash, rhinorrhea, sore throat, shortness of breath, sweats, weight loss or wheezing. Nothing aggravates the symptoms. She has tried a beta-agonist inhaler (antibiotic) for the symptoms. The treatment provided mild relief. There is no history of environmental allergies.    No problem-specific assessment & plan notes found for this encounter.   Past Medical History  Diagnosis Date  . Asthma   . Hyperlipidemia   . Hypertension   . Restless leg     Past Surgical History  Procedure Laterality Date  . Abdominal hysterectomy    . Tonsillectomy    . Colonoscopy      History reviewed. No pertinent family history.  Social History   Social History  . Marital Status: Married    Spouse Name: N/A  . Number of Children: N/A  . Years of Education: N/A   Occupational History  . Not on file.   Social History Main Topics  . Smoking status: Current Every Day Smoker  . Smokeless tobacco: Not on file  . Alcohol Use: 0.6 oz/week    1 Glasses of wine per week  . Drug Use: No  . Sexual Activity: Not Currently   Other Topics Concern  . Not on file   Social History Narrative    No Known Allergies   Review of Systems  Constitutional: Negative for fever, chills, weight loss and malaise/fatigue.  HENT: Negative for ear discharge, ear pain, postnasal  drip, rhinorrhea and sore throat.   Eyes: Negative for blurred vision.  Respiratory: Positive for cough. Negative for hemoptysis, sputum production, shortness of breath and wheezing.   Cardiovascular: Negative for chest pain, palpitations and leg swelling.  Gastrointestinal: Negative for heartburn, nausea, abdominal pain, diarrhea, constipation, blood in stool and melena.  Genitourinary: Negative for dysuria, urgency, frequency and hematuria.  Musculoskeletal: Negative for myalgias, back pain, joint pain and neck pain.  Skin: Negative for rash.  Neurological: Negative for dizziness, tingling, sensory change, focal weakness and headaches.  Endo/Heme/Allergies: Negative for environmental allergies and polydipsia. Does not bruise/bleed easily.  Psychiatric/Behavioral: Negative for depression and suicidal ideas. The patient is not nervous/anxious and does not have insomnia.      Objective  Filed Vitals:   08/13/15 1035  BP: 120/80  Pulse: 78  Height: 5\' 8"  (1.727 m)  Weight: 150 lb (68.04 kg)    Physical Exam  Constitutional: She is well-developed, well-nourished, and in no distress. No distress.  HENT:  Head: Normocephalic and atraumatic.  Right Ear: External ear normal.  Left Ear: External ear normal.  Nose: Nose normal.  Mouth/Throat: Oropharynx is clear and moist.  Eyes: Conjunctivae and EOM are normal. Pupils are equal, round, and reactive to light. Right eye exhibits no discharge. Left eye exhibits no discharge.  Neck: Normal range of motion. Neck supple. No JVD present. No thyromegaly present.  Cardiovascular: Normal rate, regular rhythm, normal heart sounds and intact distal pulses.  Exam reveals no gallop and no friction rub.   No murmur heard. Pulmonary/Chest: Effort normal and breath sounds normal. She has no wheezes. She has no rales.  Decreased inspiratory/expiratory  Abdominal: Soft. Bowel sounds are normal. She exhibits no mass. There is no tenderness. There is no  guarding.  Musculoskeletal: Normal range of motion. She exhibits no edema.  Lymphadenopathy:    She has no cervical adenopathy.  Neurological: She is alert. She has normal reflexes.  Skin: Skin is warm and dry. She is not diaphoretic.  Psychiatric: Mood and affect normal.  Nursing note and vitals reviewed.     Assessment & Plan  Problem List Items Addressed This Visit      Respiratory   Asthma, mild intermittent    Other Visit Diagnoses    Need for pneumococcal vaccination    -  Primary    Relevant Orders    Pneumococcal conjugate vaccine 13-valent (Completed)         Dr. Deanna Jones [facility]  08/13/2015

## 2015-08-17 ENCOUNTER — Other Ambulatory Visit: Payer: Self-pay

## 2015-08-17 DIAGNOSIS — J449 Chronic obstructive pulmonary disease, unspecified: Secondary | ICD-10-CM

## 2015-08-17 DIAGNOSIS — R053 Chronic cough: Secondary | ICD-10-CM

## 2015-08-17 DIAGNOSIS — Z87891 Personal history of nicotine dependence: Secondary | ICD-10-CM

## 2015-08-17 DIAGNOSIS — R05 Cough: Secondary | ICD-10-CM

## 2015-08-17 MED ORDER — LEVOFLOXACIN 500 MG PO TABS: 500.0000 mg | ORAL_TABLET | Freq: Every day | ORAL | Status: DC

## 2015-08-23 DIAGNOSIS — J449 Chronic obstructive pulmonary disease, unspecified: Secondary | ICD-10-CM | POA: Insufficient documentation

## 2015-08-23 HISTORY — DX: Chronic obstructive pulmonary disease, unspecified: J44.9

## 2015-09-14 ENCOUNTER — Other Ambulatory Visit: Admission: RE | Admit: 2015-09-14 | Payer: Medicare PPO | Source: Ambulatory Visit

## 2015-09-14 ENCOUNTER — Ambulatory Visit (INDEPENDENT_AMBULATORY_CARE_PROVIDER_SITE_OTHER): Payer: Medicare PPO | Admitting: Family Medicine

## 2015-09-14 ENCOUNTER — Encounter: Payer: Self-pay | Admitting: Family Medicine

## 2015-09-14 VITALS — BP 120/78 | HR 70 | Ht 68.0 in | Wt 150.0 lb

## 2015-09-14 DIAGNOSIS — K5732 Diverticulitis of large intestine without perforation or abscess without bleeding: Secondary | ICD-10-CM | POA: Diagnosis not present

## 2015-09-14 MED ORDER — METRONIDAZOLE 500 MG PO TABS: 500.0000 mg | ORAL_TABLET | Freq: Three times a day (TID) | ORAL | 0 refills | Status: DC

## 2015-09-14 MED ORDER — AMOXICILLIN-POT CLAVULANATE 875-125 MG PO TABS: 1.0000 | ORAL_TABLET | Freq: Two times a day (BID) | ORAL | 0 refills | Status: DC

## 2015-09-14 NOTE — Progress Notes (Signed)
Name: Caroline Harris   MRN: [id number]    DOB: Dec 07, 1945   Date:09/14/2015       Progress Note  Subjective  Chief Complaint  Chief Complaint  Patient presents with  . Flank Pain    L) sided pain x 2 days- constant    Flank Pain  This is a new problem. The current episode started in the past 7 days. The problem occurs constantly. The problem has been waxing and waning since onset. Pain location: left lateral abdominal area. The quality of the pain is described as stabbing. The pain does not radiate. The pain is at a severity of 4/10. The pain is moderate. The pain is worse during the day. Exacerbated by: move or walk. Associated symptoms include abdominal pain and pelvic pain. Pertinent negatives include no bladder incontinence, bowel incontinence, chest pain, dysuria, fever, headaches, leg pain, numbness, paresis, paresthesias, perianal numbness, tingling, weakness or weight loss. Treatments tried: tylenol. The treatment provided mild relief.    No problem-specific Assessment & Plan notes found for this encounter.   Past Medical History:  Diagnosis Date  . Asthma   . Hyperlipidemia   . Hypertension   . Restless leg     Past Surgical History:  Procedure Laterality Date  . ABDOMINAL HYSTERECTOMY    . COLONOSCOPY    . TONSILLECTOMY      History reviewed. No pertinent family history.  Social History   Social History  . Marital status: Married    Spouse name: N/A  . Number of children: N/A  . Years of education: N/A   Occupational History  . Not on file.   Social History Main Topics  . Smoking status: Current Every Day Smoker  . Smokeless tobacco: Never Used  . Alcohol use 0.6 oz/week    1 Glasses of wine per week  . Drug use: No  . Sexual activity: Not Currently   Other Topics Concern  . Not on file   Social History Narrative  . No narrative on file    No Known Allergies   Review of Systems  Constitutional: Negative for fever and weight loss.  Eyes:  Negative for blurred vision and double vision.  Respiratory: Negative for cough, hemoptysis, shortness of breath and wheezing.   Cardiovascular: Negative for chest pain, palpitations and orthopnea.  Gastrointestinal: Positive for abdominal pain. Negative for blood in stool, bowel incontinence, constipation, heartburn, melena and nausea.  Genitourinary: Positive for flank pain and pelvic pain. Negative for bladder incontinence, dysuria and frequency.  Musculoskeletal: Negative for myalgias.  Neurological: Negative for tingling, weakness, numbness, headaches and paresthesias.  Endo/Heme/Allergies: Does not bruise/bleed easily.     Objective  Vitals:   09/14/15 1116  BP: 120/78  Pulse: 70  Weight: 150 lb (68 kg)  Height: 5\' 8"  (1.727 m)    Physical Exam  Constitutional: She is well-developed, well-nourished, and in no distress. No distress.  HENT:  Head: Normocephalic and atraumatic.  Right Ear: External ear normal.  Left Ear: External ear normal.  Nose: Nose normal.  Mouth/Throat: Oropharynx is clear and moist.  Eyes: Conjunctivae and EOM are normal. Pupils are equal, round, and reactive to light. Right eye exhibits no discharge. Left eye exhibits no discharge.  Neck: Normal range of motion. Neck supple. No JVD present. No thyromegaly present.  Cardiovascular: Normal rate, regular rhythm, normal heart sounds and intact distal pulses.  Exam reveals no gallop and no friction rub.   No murmur heard. Pulmonary/Chest: Effort normal and breath sounds  normal. She has no wheezes. She has no rales.  Abdominal: Soft. Bowel sounds are normal. She exhibits no mass. There is tenderness. There is no guarding.  Musculoskeletal: Normal range of motion. She exhibits no edema.  Lymphadenopathy:    She has no cervical adenopathy.  Neurological: She is alert. She has normal reflexes.  Skin: Skin is warm and dry. She is not diaphoretic.  Psychiatric: Mood and affect normal.  Nursing note and vitals  reviewed.     Assessment & Plan  Problem List Items Addressed This Visit    None    Visit Diagnoses    Diverticulitis of large intestine without perforation or abscess without bleeding    -  Primary   Relevant Medications   amoxicillin-clavulanate (AUGMENTIN) 875-125 MG tablet   metroNIDAZOLE (FLAGYL) 500 MG tablet   Other Relevant Orders   CBC        Dr. Deanna Jones [facility]  09/14/15

## 2015-09-15 LAB — CBC
HEMATOCRIT: 35.8 % (ref 34.0–46.6)
HEMOGLOBIN: 11.8 g/dL (ref 11.1–15.9)
MCH: 30.6 pg (ref 26.6–33.0)
MCHC: 33 g/dL (ref 31.5–35.7)
MCV: 93 fL (ref 79–97)
Platelets: 246 10*3/uL (ref 150–379)
RBC: 3.86 x10E6/uL (ref 3.77–5.28)
RDW: 13.9 % (ref 12.3–15.4)
WBC: 7.1 10*3/uL (ref 3.4–10.8)

## 2015-12-13 ENCOUNTER — Ambulatory Visit (INDEPENDENT_AMBULATORY_CARE_PROVIDER_SITE_OTHER): Payer: Medicare PPO

## 2015-12-13 DIAGNOSIS — Z23 Encounter for immunization: Secondary | ICD-10-CM | POA: Diagnosis not present

## 2016-01-17 ENCOUNTER — Other Ambulatory Visit: Payer: Self-pay | Admitting: Family Medicine

## 2016-01-17 DIAGNOSIS — E785 Hyperlipidemia, unspecified: Secondary | ICD-10-CM

## 2016-02-13 ENCOUNTER — Other Ambulatory Visit: Payer: Self-pay | Admitting: Family Medicine

## 2016-02-13 DIAGNOSIS — E785 Hyperlipidemia, unspecified: Secondary | ICD-10-CM

## 2016-02-22 ENCOUNTER — Encounter: Payer: Self-pay | Admitting: Family Medicine

## 2016-02-22 ENCOUNTER — Ambulatory Visit (INDEPENDENT_AMBULATORY_CARE_PROVIDER_SITE_OTHER): Payer: Medicare PPO | Admitting: Family Medicine

## 2016-02-22 VITALS — BP 120/78 | HR 80 | Ht 68.0 in | Wt 152.0 lb

## 2016-02-22 DIAGNOSIS — J441 Chronic obstructive pulmonary disease with (acute) exacerbation: Secondary | ICD-10-CM | POA: Diagnosis not present

## 2016-02-22 DIAGNOSIS — J219 Acute bronchiolitis, unspecified: Secondary | ICD-10-CM | POA: Diagnosis not present

## 2016-02-22 DIAGNOSIS — I10 Essential (primary) hypertension: Secondary | ICD-10-CM

## 2016-02-22 DIAGNOSIS — E785 Hyperlipidemia, unspecified: Secondary | ICD-10-CM

## 2016-02-22 MED ORDER — ALBUTEROL SULFATE HFA 108 (90 BASE) MCG/ACT IN AERS: 2.0000 | INHALATION_SPRAY | Freq: Four times a day (QID) | RESPIRATORY_TRACT | 11 refills | Status: DC | PRN

## 2016-02-22 MED ORDER — GUAIFENESIN-CODEINE 100-10 MG/5ML PO SYRP: 5.0000 mL | ORAL_SOLUTION | Freq: Three times a day (TID) | ORAL | 0 refills | Status: DC | PRN

## 2016-02-22 MED ORDER — LOVASTATIN 20 MG PO TABS: 20.0000 mg | ORAL_TABLET | Freq: Every day | ORAL | 3 refills | Status: DC

## 2016-02-22 MED ORDER — MONTELUKAST SODIUM 10 MG PO TABS: 10.0000 mg | ORAL_TABLET | Freq: Every day | ORAL | 3 refills | Status: DC

## 2016-02-22 MED ORDER — AZITHROMYCIN 250 MG PO TABS: ORAL_TABLET | ORAL | 0 refills | Status: DC

## 2016-02-22 NOTE — Progress Notes (Signed)
Name: Caroline Harris   MRN: [id number]    DOB: 05/26/1945   Date:02/22/2016       Progress Note  Subjective  Chief Complaint  Chief Complaint  Patient presents with  . Hyperlipidemia  . Hypertension  . Cough    drainage    Hyperlipidemia  This is a chronic problem. The problem is controlled. Recent lipid tests were reviewed and are normal. She has no history of chronic renal disease, diabetes, hypothyroidism, liver disease, obesity or nephrotic syndrome. There are no known factors aggravating her hyperlipidemia. Associated symptoms include shortness of breath. Pertinent negatives include no chest pain, focal sensory loss, focal weakness, leg pain or myalgias. The current treatment provides mild improvement of lipids. There are no compliance problems.  Risk factors for coronary artery disease include dyslipidemia and hypertension.  Hypertension  This is a chronic problem. The current episode started more than 1 year ago. The problem has been waxing and waning since onset. The problem is controlled. Associated symptoms include shortness of breath. Pertinent negatives include no anxiety, blurred vision, chest pain, headaches, malaise/fatigue, neck pain, palpitations or peripheral edema. There are no associated agents to hypertension. There is no history of chronic renal disease.  Cough  Associated symptoms include shortness of breath. Pertinent negatives include no chest pain, chills, ear pain, fever, headaches, heartburn, myalgias, rash, sore throat, weight loss or wheezing. There is no history of environmental allergies.    No problem-specific Assessment & Plan notes found for this encounter.   Past Medical History:  Diagnosis Date  . Asthma   . Hyperlipidemia   . Hypertension   . Restless leg     Past Surgical History:  Procedure Laterality Date  . ABDOMINAL HYSTERECTOMY    . COLONOSCOPY    . TONSILLECTOMY      No family history on file.  Social History   Social History  .  Marital status: Married    Spouse name: N/A  . Number of children: N/A  . Years of education: N/A   Occupational History  . Not on file.   Social History Main Topics  . Smoking status: Current Every Day Smoker  . Smokeless tobacco: Never Used  . Alcohol use 0.6 oz/week    1 Glasses of wine per week  . Drug use: No  . Sexual activity: Not Currently   Other Topics Concern  . Not on file   Social History Narrative  . No narrative on file    No Known Allergies   Review of Systems  Constitutional: Negative for chills, fever, malaise/fatigue and weight loss.  HENT: Negative for ear discharge, ear pain and sore throat.   Eyes: Negative for blurred vision.  Respiratory: Positive for cough and shortness of breath. Negative for sputum production and wheezing.   Cardiovascular: Negative for chest pain, palpitations and leg swelling.  Gastrointestinal: Negative for abdominal pain, blood in stool, constipation, diarrhea, heartburn, melena and nausea.  Genitourinary: Negative for dysuria, frequency, hematuria and urgency.  Musculoskeletal: Negative for back pain, joint pain, myalgias and neck pain.  Skin: Negative for rash.  Neurological: Negative for dizziness, tingling, sensory change, focal weakness and headaches.  Endo/Heme/Allergies: Negative for environmental allergies and polydipsia. Does not bruise/bleed easily.  Psychiatric/Behavioral: Negative for depression and suicidal ideas. The patient is not nervous/anxious and does not have insomnia.      Objective  Vitals:   02/22/16 1119  BP: 120/78  Pulse: 80  Weight: 152 lb (68.9 kg)  Height: 5\' 8"  (1.727  m)    Physical Exam  Constitutional: She is well-developed, well-nourished, and in no distress. No distress.  HENT:  Head: Normocephalic and atraumatic.  Right Ear: External ear normal.  Left Ear: External ear normal.  Nose: Nose normal.  Mouth/Throat: Oropharynx is clear and moist.  Eyes: Conjunctivae and EOM are  normal. Pupils are equal, round, and reactive to light. Right eye exhibits no discharge. Left eye exhibits no discharge.  Neck: Normal range of motion. Neck supple. No JVD present. No thyromegaly present.  Cardiovascular: Normal rate, regular rhythm, normal heart sounds and intact distal pulses.  Exam reveals no gallop and no friction rub.   No murmur heard. Pulmonary/Chest: Effort normal and breath sounds normal. She has no wheezes. She has no rales.  Abdominal: Soft. Bowel sounds are normal. She exhibits no mass. There is no tenderness. There is no guarding.  Musculoskeletal: Normal range of motion. She exhibits no edema.  Lymphadenopathy:    She has no cervical adenopathy.  Neurological: She is alert. She has normal reflexes.  Skin: Skin is warm and dry. She is not diaphoretic.  Psychiatric: Mood and affect normal.  Nursing note and vitals reviewed.     Assessment & Plan  Problem List Items Addressed This Visit      Other   Hyperlipidemia   Relevant Medications   lovastatin (MEVACOR) 20 MG tablet   Other Relevant Orders   Lipid panel   Hepatic Function Panel (6)   Hepatitis C antibody    Other Visit Diagnoses    Essential hypertension    -  Primary   Relevant Medications   lovastatin (MEVACOR) 20 MG tablet   Other Relevant Orders   Lipid panel   Hepatic Function Panel (6)   Renal Function Panel   Hepatitis C antibody   Bronchiolitis       Relevant Medications   montelukast (SINGULAIR) 10 MG tablet   guaiFENesin-codeine (ROBITUSSIN AC) 100-10 MG/5ML syrup   Chronic obstructive pulmonary disease with acute exacerbation (HCC)       Relevant Medications   budesonide-formoterol (SYMBICORT) 160-4.5 MCG/ACT inhaler   albuterol (PROVENTIL HFA;VENTOLIN HFA) 108 (90 Base) MCG/ACT inhaler   montelukast (SINGULAIR) 10 MG tablet   azithromycin (ZITHROMAX) 250 MG tablet   guaiFENesin-codeine (ROBITUSSIN AC) 100-10 MG/5ML syrup        Dr. Deanna Jones [facility]  02/22/16

## 2016-02-23 LAB — RENAL FUNCTION PANEL
Albumin: 4.5 g/dL (ref 3.5–4.8)
BUN/Creatinine Ratio: 19 (ref 12–28)
BUN: 12 mg/dL (ref 8–27)
CALCIUM: 9.2 mg/dL (ref 8.7–10.3)
CO2: 25 mmol/L (ref 18–29)
Chloride: 98 mmol/L (ref 96–106)
Creatinine, Ser: 0.63 mg/dL (ref 0.57–1.00)
GFR calc Af Amer: 104 mL/min/{1.73_m2} (ref >59)
GFR calc non Af Amer: 91 mL/min/{1.73_m2} (ref >59)
GLUCOSE: 77 mg/dL (ref 65–99)
PHOSPHORUS: 4 mg/dL (ref 2.5–4.5)
Potassium: 4.4 mmol/L (ref 3.5–5.2)
SODIUM: 140 mmol/L (ref 134–144)

## 2016-02-23 LAB — HEPATITIS C ANTIBODY

## 2016-02-23 LAB — LIPID PANEL
Chol/HDL Ratio: 2.1 ratio units (ref 0.0–4.4)
Cholesterol, Total: 178 mg/dL (ref 100–199)
HDL: 86 mg/dL (ref >39)
LDL Calculated: 72 mg/dL (ref 0–99)
TRIGLYCERIDES: 98 mg/dL (ref 0–149)
VLDL CHOLESTEROL CAL: 20 mg/dL (ref 5–40)

## 2016-02-23 LAB — HEPATIC FUNCTION PANEL (6)
ALT: 20 IU/L (ref 0–32)
AST: 28 IU/L (ref 0–40)
Alkaline Phosphatase: 34 IU/L — ABNORMAL LOW (ref 39–117)
Bilirubin Total: 0.2 mg/dL (ref 0.0–1.2)
Bilirubin, Direct: 0.1 mg/dL (ref 0.00–0.40)

## 2016-06-06 ENCOUNTER — Ambulatory Visit (INDEPENDENT_AMBULATORY_CARE_PROVIDER_SITE_OTHER): Payer: Medicare PPO | Admitting: Family Medicine

## 2016-06-06 ENCOUNTER — Encounter: Payer: Self-pay | Admitting: Family Medicine

## 2016-06-06 VITALS — BP 122/62 | HR 62 | Ht 68.0 in | Wt 150.0 lb

## 2016-06-06 DIAGNOSIS — R197 Diarrhea, unspecified: Secondary | ICD-10-CM | POA: Diagnosis not present

## 2016-06-06 DIAGNOSIS — Z1211 Encounter for screening for malignant neoplasm of colon: Secondary | ICD-10-CM

## 2016-06-06 NOTE — Progress Notes (Signed)
Name: Caroline Harris   MRN: [id number]    DOB: Jun 28, 1945   Date:06/06/2016       Progress Note  Subjective  Chief Complaint  Chief Complaint  Patient presents with  . Diarrhea    appt for colonoscopy in May for consultation. Diarrhea been going on since April 8- has not been every day but spiradically occurring. Very gassy    Diarrhea   This is a new problem. The current episode started 1 to 4 weeks ago. The problem occurs 2 to 4 times per day. The problem has been waxing and waning. The stool consistency is described as watery. The patient states that diarrhea awakens her from sleep. Associated symptoms include abdominal pain, bloating and increased flatus. Pertinent negatives include no chills, coughing, fever, headaches, myalgias, sweats, URI, vomiting or weight loss. She has tried anti-motility drug for the symptoms. The treatment provided mild relief.    No problem-specific Assessment & Plan notes found for this encounter.   Past Medical History:  Diagnosis Date  . Asthma   . Hyperlipidemia   . Hypertension   . Restless leg     Past Surgical History:  Procedure Laterality Date  . ABDOMINAL HYSTERECTOMY    . COLONOSCOPY    . TONSILLECTOMY      No family history on file.  Social History   Social History  . Marital status: Married    Spouse name: N/A  . Number of children: N/A  . Years of education: N/A   Occupational History  . Not on file.   Social History Main Topics  . Smoking status: Current Every Day Smoker  . Smokeless tobacco: Never Used  . Alcohol use 0.6 oz/week    1 Glasses of wine per week  . Drug use: No  . Sexual activity: Not Currently   Other Topics Concern  . Not on file   Social History Narrative  . No narrative on file    No Known Allergies  Outpatient Medications Prior to Visit  Medication Sig Dispense Refill  . albuterol (PROVENTIL HFA;VENTOLIN HFA) 108 (90 Base) MCG/ACT inhaler Inhale 2 puffs into the lungs every 6 (six) hours  as needed for wheezing or shortness of breath. 1 Inhaler 11  . aspirin 81 MG tablet Take 81 mg by mouth daily.    . budesonide-formoterol (SYMBICORT) 160-4.5 MCG/ACT inhaler Inhale 2 puffs into the lungs 2 (two) times daily.    . calcium carbonate 1250 MG capsule Take 1,250 mg by mouth 2 (two) times daily with a meal.    . lovastatin (MEVACOR) 20 MG tablet Take 1 tablet (20 mg total) by mouth daily. 90 tablet 3  . montelukast (SINGULAIR) 10 MG tablet Take 1 tablet (10 mg total) by mouth daily. 90 tablet 3  . Multiple Vitamins-Minerals (CENTRUM SILVER ADULT 50+ PO) Take 1 capsule by mouth daily at 6 (six) AM.    . Omega-3 Fatty Acids (FISH OIL) 1000 MG CAPS Take 1 capsule by mouth daily at 6 (six) AM.    . rOPINIRole (REQUIP) 0.25 MG tablet Take 1 tablet (0.25 mg total) by mouth at bedtime. 30 tablet 7  . azithromycin (ZITHROMAX) 250 MG tablet 2 today then 1 a day for 4 days 6 tablet 0  . guaiFENesin-codeine (ROBITUSSIN AC) 100-10 MG/5ML syrup Take 5 mLs by mouth 3 (three) times daily as needed for cough. 150 mL 0   No facility-administered medications prior to visit.     Review of Systems  Constitutional: Negative for  chills, fever, malaise/fatigue and weight loss.  HENT: Negative for ear discharge, ear pain and sore throat.   Eyes: Negative for blurred vision.  Respiratory: Negative for cough, sputum production, shortness of breath and wheezing.   Cardiovascular: Negative for chest pain, palpitations and leg swelling.  Gastrointestinal: Positive for abdominal pain, bloating, diarrhea and flatus. Negative for blood in stool, constipation, heartburn, melena, nausea and vomiting.  Genitourinary: Negative for dysuria, frequency, hematuria and urgency.  Musculoskeletal: Negative for back pain, joint pain, myalgias and neck pain.  Skin: Negative for rash.  Neurological: Negative for dizziness, tingling, sensory change, focal weakness and headaches.  Endo/Heme/Allergies: Negative for  environmental allergies and polydipsia. Does not bruise/bleed easily.  Psychiatric/Behavioral: Negative for depression and suicidal ideas. The patient is not nervous/anxious and does not have insomnia.      Objective  Vitals:   06/06/16 1500  BP: 122/62  Pulse: 62  Weight: 150 lb (68 kg)  Height: 5\' 8"  (1.727 m)    Physical Exam  Constitutional: She is well-developed, well-nourished, and in no distress. No distress.  HENT:  Head: Normocephalic and atraumatic.  Right Ear: External ear normal.  Left Ear: External ear normal.  Nose: Nose normal.  Mouth/Throat: Oropharynx is clear and moist.  Eyes: Conjunctivae and EOM are normal. Pupils are equal, round, and reactive to light. Right eye exhibits no discharge. Left eye exhibits no discharge.  Neck: Normal range of motion. Neck supple. No JVD present. No thyromegaly present.  Cardiovascular: Normal rate, regular rhythm, normal heart sounds and intact distal pulses.  Exam reveals no gallop and no friction rub.   No murmur heard. Pulmonary/Chest: Effort normal and breath sounds normal. She has no wheezes. She has no rales.  Abdominal: Soft. Bowel sounds are normal. She exhibits no mass. There is no hepatosplenomegaly. There is generalized tenderness. There is no rebound and no guarding.  Genitourinary: Rectum normal. Rectal exam shows guaiac negative stool.  Musculoskeletal: Normal range of motion. She exhibits no edema.  Lymphadenopathy:    She has no cervical adenopathy.  Neurological: She is alert. She has normal reflexes.  Skin: Skin is warm and dry. She is not diaphoretic.  Psychiatric: Mood and affect normal.  Nursing note and vitals reviewed.     Assessment & Plan  Problem List Items Addressed This Visit    None    Visit Diagnoses    Diarrhea, unspecified type    -  Primary   Relevant Orders   Sedimentation rate   Gliadin antibodies, serum   Tissue transglutaminase, IgA   Reticulin Antibody, IgA w reflex titer    CBC with Differential/Platelet   Ambulatory referral to Gastroenterology   Encounter for screening colonoscopy          No orders of the defined types were placed in this encounter.     Dr. Macon [facility]  06/06/16

## 2016-06-10 ENCOUNTER — Other Ambulatory Visit: Payer: Self-pay

## 2016-06-11 LAB — CBC WITH DIFFERENTIAL/PLATELET
BASOS ABS: 0 10*3/uL (ref 0.0–0.2)
Basos: 1 %
EOS (ABSOLUTE): 0.1 10*3/uL (ref 0.0–0.4)
Eos: 1 %
Hematocrit: 38.8 % (ref 34.0–46.6)
Hemoglobin: 12.7 g/dL (ref 11.1–15.9)
Immature Grans (Abs): 0 10*3/uL (ref 0.0–0.1)
Immature Granulocytes: 0 %
LYMPHS: 27 %
Lymphocytes Absolute: 2 10*3/uL (ref 0.7–3.1)
MCH: 30.5 pg (ref 26.6–33.0)
MCHC: 32.7 g/dL (ref 31.5–35.7)
MCV: 93 fL (ref 79–97)
MONOCYTES: 6 %
Monocytes Absolute: 0.5 10*3/uL (ref 0.1–0.9)
NEUTROS ABS: 4.9 10*3/uL (ref 1.4–7.0)
NEUTROS PCT: 65 %
PLATELETS: 270 10*3/uL (ref 150–379)
RBC: 4.16 x10E6/uL (ref 3.77–5.28)
RDW: 14 % (ref 12.3–15.4)
WBC: 7.5 10*3/uL (ref 3.4–10.8)

## 2016-06-11 LAB — GLIADIN ANTIBODIES, SERUM
Antigliadin Abs, IgA: 2 units (ref 0–19)
Gliadin IgG: 2 units (ref 0–19)

## 2016-06-11 LAB — SEDIMENTATION RATE: SED RATE: 2 mm/h (ref 0–40)

## 2016-06-11 LAB — RETICULIN ANTIBODIES, IGA W TITER: RETICULIN AB, IGA: NEGATIVE {titer}

## 2016-06-11 LAB — TISSUE TRANSGLUTAMINASE, IGA

## 2016-06-23 ENCOUNTER — Other Ambulatory Visit: Payer: Self-pay | Admitting: Family Medicine

## 2016-06-23 DIAGNOSIS — G2581 Restless legs syndrome: Secondary | ICD-10-CM

## 2016-07-23 ENCOUNTER — Other Ambulatory Visit: Payer: Self-pay | Admitting: Family Medicine

## 2016-07-23 DIAGNOSIS — G2581 Restless legs syndrome: Secondary | ICD-10-CM

## 2016-08-23 ENCOUNTER — Other Ambulatory Visit: Payer: Self-pay | Admitting: Family Medicine

## 2016-08-23 DIAGNOSIS — G2581 Restless legs syndrome: Secondary | ICD-10-CM

## 2016-09-30 ENCOUNTER — Other Ambulatory Visit: Payer: Self-pay | Admitting: Family Medicine

## 2016-09-30 DIAGNOSIS — G2581 Restless legs syndrome: Secondary | ICD-10-CM

## 2017-02-22 ENCOUNTER — Other Ambulatory Visit: Payer: Self-pay | Admitting: Family Medicine

## 2017-02-22 DIAGNOSIS — J219 Acute bronchiolitis, unspecified: Secondary | ICD-10-CM

## 2017-02-22 DIAGNOSIS — J441 Chronic obstructive pulmonary disease with (acute) exacerbation: Secondary | ICD-10-CM

## 2017-03-28 ENCOUNTER — Other Ambulatory Visit: Payer: Self-pay | Admitting: Family Medicine

## 2017-03-28 DIAGNOSIS — E785 Hyperlipidemia, unspecified: Secondary | ICD-10-CM

## 2017-04-14 ENCOUNTER — Encounter: Payer: Self-pay | Admitting: Family Medicine

## 2017-04-14 ENCOUNTER — Ambulatory Visit (INDEPENDENT_AMBULATORY_CARE_PROVIDER_SITE_OTHER): Payer: Medicare PPO | Admitting: Family Medicine

## 2017-04-14 VITALS — BP 120/70 | HR 76 | Temp 98.8°F | Ht 68.0 in | Wt 149.0 lb

## 2017-04-14 DIAGNOSIS — E785 Hyperlipidemia, unspecified: Secondary | ICD-10-CM

## 2017-04-14 DIAGNOSIS — J0100 Acute maxillary sinusitis, unspecified: Secondary | ICD-10-CM | POA: Diagnosis not present

## 2017-04-14 MED ORDER — AMOXICILLIN 500 MG PO CAPS: 500.0000 mg | ORAL_CAPSULE | Freq: Three times a day (TID) | ORAL | 0 refills | Status: DC

## 2017-04-14 MED ORDER — LOVASTATIN 20 MG PO TABS: 20.0000 mg | ORAL_TABLET | Freq: Every day | ORAL | 3 refills | Status: DC

## 2017-04-14 NOTE — Progress Notes (Signed)
Name: Caroline Harris   MRN: [id number]    DOB: [birth year]   Date:04/14/2017       Progress Note  Subjective  Chief Complaint  Chief Complaint  Patient presents with  . Hypertension  . Hyperlipidemia  . Cough    fever, chills, body aches    Hypertension  This is a new problem. The current episode started in the past 7 days. The problem has been gradually worsening since onset. The problem is controlled. Associated symptoms include headaches and neck pain. Pertinent negatives include no anxiety, blurred vision, chest pain, malaise/fatigue, orthopnea, palpitations, peripheral edema, PND, shortness of breath or sweats. (Frontal headache) There are no associated agents to hypertension. Past treatments include diuretics. The current treatment provides mild improvement. There are no compliance problems.  There is no history of angina, kidney disease, CAD/MI, CVA, heart failure, left ventricular hypertrophy, PVD or retinopathy. There is no history of chronic renal disease, a hypertension causing med or renovascular disease.  Hyperlipidemia  This is a new problem. The current episode started more than 1 year ago. The problem is controlled. Recent lipid tests were reviewed and are normal. She has no history of chronic renal disease or diabetes. Pertinent negatives include no chest pain, focal weakness, myalgias or shortness of breath.  Cough  This is a new problem. The current episode started in the past 7 days. The problem has been gradually worsening. The cough is non-productive. Associated symptoms include headaches. Pertinent negatives include no chest pain, chills, ear congestion, ear pain, fever, heartburn, hemoptysis, myalgias, nasal congestion, postnasal drip, rash, rhinorrhea, sore throat, shortness of breath, sweats, weight loss or wheezing. Treatments tried: prescription/cough. The treatment provided moderate relief. There is no history of COPD, environmental allergies or pneumonia.    No  problem-specific Assessment & Plan notes found for this encounter.   Past Medical History:  Diagnosis Date  . Asthma   . Hyperlipidemia   . Hypertension   . Restless leg     Past Surgical History:  Procedure Laterality Date  . ABDOMINAL HYSTERECTOMY    . COLONOSCOPY    . TONSILLECTOMY      History reviewed. No pertinent family history.  Social History   Socioeconomic History  . Marital status: Married    Spouse name: Not on file  . Number of children: Not on file  . Years of education: Not on file  . Highest education level: Not on file  Social Needs  . Financial resource strain: Not on file  . Food insecurity - worry: Not on file  . Food insecurity - inability: Not on file  . Transportation needs - medical: Not on file  . Transportation needs - non-medical: Not on file  Occupational History  . Not on file  Tobacco Use  . Smoking status: Current Every Day Smoker  . Smokeless tobacco: Never Used  Substance and Sexual Activity  . Alcohol use: Yes    Alcohol/week: 0.6 oz    Types: 1 Glasses of wine per week  . Drug use: No  . Sexual activity: Not Currently  Other Topics Concern  . Not on file  Social History Narrative  . Not on file    No Known Allergies  Outpatient Medications Prior to Visit  Medication Sig Dispense Refill  . aspirin 81 MG tablet Take 81 mg by mouth daily.    . calcium carbonate 1250 MG capsule Take 1,250 mg by mouth 2 (two) times daily with a meal.    . montelukast (  SINGULAIR) 10 MG tablet TAKE 1 TABLET(10 MG) BY MOUTH DAILY 90 tablet 0  . Multiple Vitamins-Minerals (CENTRUM SILVER ADULT 50+ PO) Take 1 capsule by mouth daily at 6 (six) AM.    . rOPINIRole (REQUIP) 0.25 MG tablet TAKE 1 TABLET(0.25 MG) BY MOUTH AT BEDTIME 30 tablet 0  . lovastatin (MEVACOR) 20 MG tablet TAKE 1 TABLET BY MOUTH DAILY 30 tablet 0  . albuterol (PROVENTIL HFA;VENTOLIN HFA) 108 (90 Base) MCG/ACT inhaler Inhale 2 puffs into the lungs every 6 (six) hours as needed  for wheezing or shortness of breath. (Patient not taking: Reported on 04/14/2017) 1 Inhaler 11  . budesonide-formoterol (SYMBICORT) 160-4.5 MCG/ACT inhaler Inhale 2 puffs into the lungs 2 (two) times daily.    . Omega-3 Fatty Acids (FISH OIL) 1000 MG CAPS Take 1 capsule by mouth daily at 6 (six) AM.     No facility-administered medications prior to visit.     Review of Systems  Constitutional: Negative for chills, fever, malaise/fatigue and weight loss.  HENT: Negative for ear discharge, ear pain, postnasal drip, rhinorrhea and sore throat.   Eyes: Negative for blurred vision.  Respiratory: Positive for cough. Negative for hemoptysis, sputum production, shortness of breath and wheezing.   Cardiovascular: Negative for chest pain, palpitations, orthopnea, leg swelling and PND.  Gastrointestinal: Negative for abdominal pain, blood in stool, constipation, diarrhea, heartburn, melena and nausea.  Genitourinary: Negative for dysuria, frequency, hematuria and urgency.  Musculoskeletal: Positive for neck pain. Negative for back pain, joint pain and myalgias.  Skin: Negative for rash.  Neurological: Positive for headaches. Negative for dizziness, tingling, sensory change and focal weakness.  Endo/Heme/Allergies: Negative for environmental allergies and polydipsia. Does not bruise/bleed easily.  Psychiatric/Behavioral: Negative for depression and suicidal ideas. The patient is not nervous/anxious and does not have insomnia.      Objective  Vitals:   04/14/17 1012  BP: 120/70  Pulse: 76  Temp: 98.8 F (37.1 C)  TempSrc: Oral  Weight: 149 lb (67.6 kg)  Height: 5\' 8"  (1.727 m)    Physical Exam  Constitutional: She is well-developed, well-nourished, and in no distress. No distress.  HENT:  Head: Normocephalic and atraumatic.  Right Ear: External ear normal.  Left Ear: External ear normal.  Nose: Nose normal.  Mouth/Throat: Oropharynx is clear and moist.  Eyes: Conjunctivae and EOM are  normal. Pupils are equal, round, and reactive to light. Right eye exhibits no discharge. Left eye exhibits no discharge.  Neck: Normal range of motion. Neck supple. No JVD present. No thyromegaly present.  Cardiovascular: Normal rate, regular rhythm, normal heart sounds and intact distal pulses. Exam reveals no gallop and no friction rub.  No murmur heard. Pulmonary/Chest: Effort normal and breath sounds normal. She has no wheezes. She has no rales.  Abdominal: Soft. Bowel sounds are normal. She exhibits no mass. There is no tenderness. There is no guarding.  Musculoskeletal: Normal range of motion. She exhibits no edema.  Lymphadenopathy:    She has no cervical adenopathy.  Neurological: She is alert. She has normal reflexes.  Skin: Skin is warm and dry. She is not diaphoretic.  Psychiatric: Mood and affect normal.  Nursing note and vitals reviewed.     Assessment & Plan  Problem List Items Addressed This Visit      Other   Hyperlipidemia   Relevant Medications   lovastatin (MEVACOR) 20 MG tablet    Other Visit Diagnoses    Acute maxillary sinusitis, recurrence not specified    -  Primary   Relevant Medications   amoxicillin (AMOXIL) 500 MG capsule      Meds ordered this encounter  Medications  . lovastatin (MEVACOR) 20 MG tablet    Sig: Take 1 tablet (20 mg total) by mouth daily.    Dispense:  90 tablet    Refill:  3    sched appt for meds  . amoxicillin (AMOXIL) 500 MG capsule    Sig: Take 1 capsule (500 mg total) by mouth 3 (three) times daily.    Dispense:  30 capsule    Refill:  0      Dr. Otilio Miu [facility]  04/14/17

## 2017-04-20 ENCOUNTER — Other Ambulatory Visit: Payer: Medicare PPO

## 2017-04-20 DIAGNOSIS — R69 Illness, unspecified: Secondary | ICD-10-CM

## 2017-04-20 DIAGNOSIS — E782 Mixed hyperlipidemia: Secondary | ICD-10-CM

## 2017-04-21 LAB — RENAL FUNCTION PANEL
Albumin: 4.2 g/dL (ref 3.5–4.8)
BUN / CREAT RATIO: 18 (ref 12–28)
BUN: 13 mg/dL (ref 8–27)
CO2: 26 mmol/L (ref 20–29)
CREATININE: 0.73 mg/dL (ref 0.57–1.00)
Calcium: 9.2 mg/dL (ref 8.7–10.3)
Chloride: 100 mmol/L (ref 96–106)
GFR, EST AFRICAN AMERICAN: 95 mL/min/{1.73_m2} (ref >59)
GFR, EST NON AFRICAN AMERICAN: 83 mL/min/{1.73_m2} (ref >59)
Glucose: 73 mg/dL (ref 65–99)
Phosphorus: 4 mg/dL (ref 2.5–4.5)
Potassium: 4.4 mmol/L (ref 3.5–5.2)
SODIUM: 141 mmol/L (ref 134–144)

## 2017-04-21 LAB — HEPATIC FUNCTION PANEL
ALT: 14 IU/L (ref 0–32)
AST: 19 IU/L (ref 0–40)
Alkaline Phosphatase: 36 IU/L — ABNORMAL LOW (ref 39–117)
BILIRUBIN TOTAL: 0.3 mg/dL (ref 0.0–1.2)
BILIRUBIN, DIRECT: 0.1 mg/dL (ref 0.00–0.40)
Total Protein: 6.6 g/dL (ref 6.0–8.5)

## 2017-04-21 LAB — LIPID PANEL WITH LDL/HDL RATIO
CHOLESTEROL TOTAL: 160 mg/dL (ref 100–199)
HDL: 63 mg/dL (ref >39)
LDL Calculated: 80 mg/dL (ref 0–99)
LDl/HDL Ratio: 1.3 ratio (ref 0.0–3.2)
TRIGLYCERIDES: 87 mg/dL (ref 0–149)
VLDL Cholesterol Cal: 17 mg/dL (ref 5–40)

## 2017-05-01 ENCOUNTER — Ambulatory Visit (INDEPENDENT_AMBULATORY_CARE_PROVIDER_SITE_OTHER): Payer: Medicare PPO | Admitting: Family Medicine

## 2017-05-01 ENCOUNTER — Encounter: Payer: Self-pay | Admitting: Family Medicine

## 2017-05-01 VITALS — BP 120/78 | HR 64 | Ht 68.0 in | Wt 149.0 lb

## 2017-05-01 DIAGNOSIS — J441 Chronic obstructive pulmonary disease with (acute) exacerbation: Secondary | ICD-10-CM

## 2017-05-01 DIAGNOSIS — J219 Acute bronchiolitis, unspecified: Secondary | ICD-10-CM | POA: Diagnosis not present

## 2017-05-01 MED ORDER — AZITHROMYCIN 250 MG PO TABS: ORAL_TABLET | ORAL | 0 refills | Status: DC

## 2017-05-01 MED ORDER — GUAIFENESIN-CODEINE 100-10 MG/5ML PO SYRP: 5.0000 mL | ORAL_SOLUTION | Freq: Three times a day (TID) | ORAL | 0 refills | Status: DC | PRN

## 2017-05-01 MED ORDER — ALBUTEROL SULFATE HFA 108 (90 BASE) MCG/ACT IN AERS
2.0000 | INHALATION_SPRAY | Freq: Four times a day (QID) | RESPIRATORY_TRACT | 11 refills | Status: DC | PRN
Start: 2017-05-01 — End: 2019-05-11

## 2017-05-01 MED ORDER — BUDESONIDE-FORMOTEROL FUMARATE 160-4.5 MCG/ACT IN AERO: 2.0000 | INHALATION_SPRAY | Freq: Two times a day (BID) | RESPIRATORY_TRACT | 11 refills | Status: DC

## 2017-05-01 NOTE — Progress Notes (Signed)
Name: Caroline Harris   MRN: [id number]    DOB: 09-25-1945   Date:05/01/2017       Progress Note  Subjective  Chief Complaint  Chief Complaint  Patient presents with  . Cough    non productive- not using inhalers because "I don't feel like I need them." Also had a round of Amox on 2/26    Cough  This is a recurrent problem. The current episode started more than 1 year ago. The problem has been waxing and waning. The cough is non-productive. Pertinent negatives include no chest pain, chills, ear congestion, ear pain, fever, headaches, heartburn, hemoptysis, myalgias, nasal congestion, postnasal drip, rash, rhinorrhea, sore throat, shortness of breath, sweats, weight loss or wheezing. The symptoms are aggravated by pollens. She has tried a beta-agonist inhaler and steroid inhaler for the symptoms. The treatment provided moderate relief. Her past medical history is significant for COPD. There is no history of asthma, bronchiectasis, bronchitis, emphysema, environmental allergies or pneumonia.    No problem-specific Assessment & Plan notes found for this encounter.   Past Medical History:  Diagnosis Date  . Asthma   . Hyperlipidemia   . Hypertension   . Restless leg     Past Surgical History:  Procedure Laterality Date  . ABDOMINAL HYSTERECTOMY    . COLONOSCOPY    . TONSILLECTOMY      No family history on file.  Social History   Socioeconomic History  . Marital status: Married    Spouse name: Not on file  . Number of children: Not on file  . Years of education: Not on file  . Highest education level: Not on file  Social Needs  . Financial resource strain: Not on file  . Food insecurity - worry: Not on file  . Food insecurity - inability: Not on file  . Transportation needs - medical: Not on file  . Transportation needs - non-medical: Not on file  Occupational History  . Not on file  Tobacco Use  . Smoking status: Current Every Day Smoker  . Smokeless tobacco: Never Used   Substance and Sexual Activity  . Alcohol use: Yes    Alcohol/week: 0.6 oz    Types: 1 Glasses of wine per week  . Drug use: No  . Sexual activity: Not Currently  Other Topics Concern  . Not on file  Social History Narrative  . Not on file    No Known Allergies  Outpatient Medications Prior to Visit  Medication Sig Dispense Refill  . aspirin 81 MG tablet Take 81 mg by mouth daily.    . calcium carbonate 1250 MG capsule Take 1,250 mg by mouth 2 (two) times daily with a meal.    . lovastatin (MEVACOR) 20 MG tablet Take 1 tablet (20 mg total) by mouth daily. 90 tablet 3  . montelukast (SINGULAIR) 10 MG tablet TAKE 1 TABLET(10 MG) BY MOUTH DAILY 90 tablet 0  . Multiple Vitamins-Minerals (CENTRUM SILVER ADULT 50+ PO) Take 1 capsule by mouth daily at 6 (six) AM.    . rOPINIRole (REQUIP) 0.25 MG tablet TAKE 1 TABLET(0.25 MG) BY MOUTH AT BEDTIME 30 tablet 0  . albuterol (PROVENTIL HFA;VENTOLIN HFA) 108 (90 Base) MCG/ACT inhaler Inhale 2 puffs into the lungs every 6 (six) hours as needed for wheezing or shortness of breath. (Patient not taking: Reported on 04/14/2017) 1 Inhaler 11  . amoxicillin (AMOXIL) 500 MG capsule Take 1 capsule (500 mg total) by mouth 3 (three) times daily. 30 capsule 0  .  budesonide-formoterol (SYMBICORT) 160-4.5 MCG/ACT inhaler Inhale 2 puffs into the lungs 2 (two) times daily.     No facility-administered medications prior to visit.     Review of Systems  Constitutional: Negative for chills, fever, malaise/fatigue and weight loss.  HENT: Negative for ear discharge, ear pain, postnasal drip, rhinorrhea and sore throat.   Eyes: Negative for blurred vision.  Respiratory: Positive for cough. Negative for hemoptysis, sputum production, shortness of breath and wheezing.   Cardiovascular: Negative for chest pain, palpitations and leg swelling.  Gastrointestinal: Negative for abdominal pain, blood in stool, constipation, diarrhea, heartburn, melena and nausea.   Genitourinary: Negative for dysuria, frequency, hematuria and urgency.  Musculoskeletal: Negative for back pain, joint pain, myalgias and neck pain.  Skin: Negative for rash.  Neurological: Negative for dizziness, tingling, sensory change, focal weakness and headaches.  Endo/Heme/Allergies: Negative for environmental allergies and polydipsia. Does not bruise/bleed easily.  Psychiatric/Behavioral: Negative for depression and suicidal ideas. The patient is not nervous/anxious and does not have insomnia.      Objective  Vitals:   05/01/17 1331  BP: 120/78  Pulse: 64  Weight: 149 lb (67.6 kg)  Height: 5\' 8"  (1.727 m)    Physical Exam  Constitutional: She is well-developed, well-nourished, and in no distress. No distress.  HENT:  Head: Normocephalic and atraumatic.  Right Ear: External ear normal.  Left Ear: External ear normal.  Nose: Nose normal.  Mouth/Throat: Oropharynx is clear and moist.  Eyes: Conjunctivae and EOM are normal. Pupils are equal, round, and reactive to light. Right eye exhibits no discharge. Left eye exhibits no discharge.  Neck: Normal range of motion. Neck supple. No JVD present. No thyromegaly present.  Cardiovascular: Normal rate, regular rhythm, normal heart sounds and intact distal pulses. Exam reveals no gallop and no friction rub.  No murmur heard. Pulmonary/Chest: Effort normal and breath sounds normal. She has no wheezes. She has no rales.  Abdominal: Soft. Bowel sounds are normal. She exhibits no mass. There is no tenderness. There is no guarding.  Musculoskeletal: Normal range of motion. She exhibits no edema.  Lymphadenopathy:    She has no cervical adenopathy.  Neurological: She is alert. She has normal reflexes.  Skin: Skin is warm and dry. She is not diaphoretic.  Psychiatric: Mood and affect normal.  Nursing note and vitals reviewed.     Assessment & Plan  Problem List Items Addressed This Visit    None    Visit Diagnoses     Bronchiolitis    -  Primary   Relevant Medications   azithromycin (ZITHROMAX) 250 MG tablet   guaiFENesin-codeine (ROBITUSSIN AC) 100-10 MG/5ML syrup   Chronic obstructive pulmonary disease with acute exacerbation (HCC)       Relevant Medications   albuterol (PROVENTIL HFA;VENTOLIN HFA) 108 (90 Base) MCG/ACT inhaler   budesonide-formoterol (SYMBICORT) 160-4.5 MCG/ACT inhaler   azithromycin (ZITHROMAX) 250 MG tablet   guaiFENesin-codeine (ROBITUSSIN AC) 100-10 MG/5ML syrup      Meds ordered this encounter  Medications  . albuterol (PROVENTIL HFA;VENTOLIN HFA) 108 (90 Base) MCG/ACT inhaler    Sig: Inhale 2 puffs into the lungs every 6 (six) hours as needed for wheezing or shortness of breath.    Dispense:  1 Inhaler    Refill:  11  . budesonide-formoterol (SYMBICORT) 160-4.5 MCG/ACT inhaler    Sig: Inhale 2 puffs into the lungs 2 (two) times daily.    Dispense:  1 Inhaler    Refill:  11  . azithromycin (ZITHROMAX) 250  MG tablet    Sig: 2 today then 1 a day for 4 days    Dispense:  6 tablet    Refill:  0  . guaiFENesin-codeine (ROBITUSSIN AC) 100-10 MG/5ML syrup    Sig: Take 5 mLs by mouth 3 (three) times daily as needed for cough.    Dispense:  100 mL    Refill:  0      Dr. Otilio Miu [facility]  05/01/17

## 2017-05-11 ENCOUNTER — Ambulatory Visit
Admission: RE | Admit: 2017-05-11 | Discharge: 2017-05-11 | Disposition: A | Discharge status: Home / Self Care | Payer: Medicare PPO | Source: Ambulatory Visit | Attending: Family Medicine | Admitting: Family Medicine

## 2017-05-11 ENCOUNTER — Ambulatory Visit (INDEPENDENT_AMBULATORY_CARE_PROVIDER_SITE_OTHER): Payer: Medicare PPO | Admitting: Family Medicine

## 2017-05-11 ENCOUNTER — Telehealth: Payer: Self-pay

## 2017-05-11 ENCOUNTER — Encounter: Payer: Self-pay | Admitting: Family Medicine

## 2017-05-11 VITALS — BP 120/70 | HR 100 | Temp 100.1°F | Ht 68.0 in | Wt 146.0 lb

## 2017-05-11 DIAGNOSIS — R911 Solitary pulmonary nodule: Secondary | ICD-10-CM | POA: Diagnosis not present

## 2017-05-11 DIAGNOSIS — J439 Emphysema, unspecified: Secondary | ICD-10-CM | POA: Diagnosis not present

## 2017-05-11 DIAGNOSIS — R509 Fever, unspecified: Secondary | ICD-10-CM

## 2017-05-11 DIAGNOSIS — J189 Pneumonia, unspecified organism: Secondary | ICD-10-CM

## 2017-05-11 DIAGNOSIS — J181 Lobar pneumonia, unspecified organism: Secondary | ICD-10-CM | POA: Diagnosis present

## 2017-05-11 LAB — POCT INFLUENZA A/B
INFLUENZA B, POC: NEGATIVE
Influenza A, POC: NEGATIVE

## 2017-05-11 MED ORDER — CEFUROXIME AXETIL 500 MG PO TABS: 500.0000 mg | ORAL_TABLET | Freq: Two times a day (BID) | ORAL | 0 refills | Status: DC

## 2017-05-11 NOTE — Progress Notes (Signed)
Name: Caroline Harris   MRN: [id number]    DOB: December 22, 1945   Date:05/11/2017       Progress Note  Subjective  Chief Complaint  Chief Complaint  Patient presents with  . Fever    started Saturday night- fever, chills, cough, no appetite, production with color from cough    Fever   This is a new problem. The current episode started in the past 7 days (Saturday evening). The problem occurs constantly. The problem has been gradually worsening. The maximum temperature noted was 100 to 100.9 F. Associated symptoms include coughing, headaches, muscle aches and nausea. Pertinent negatives include no abdominal pain, chest pain, congestion, diarrhea, ear pain, rash, sleepiness, sore throat, urinary pain, vomiting or wheezing. She has tried acetaminophen and fluids for the symptoms. The treatment provided moderate relief.  Risk factors: no contaminated food, no contaminated water, no hx of cancer, no immunosuppression, no occupational exposure, no recent sickness, no recent travel and no sick contacts   Cough  This is a new problem. The current episode started in the past 7 days. The problem has been gradually worsening. The cough is productive of purulent sputum (green/yellow). Associated symptoms include a fever and headaches. Pertinent negatives include no chest pain, chills, ear pain, heartburn, myalgias, rash, sore throat, shortness of breath, weight loss or wheezing. There is no history of environmental allergies.    No problem-specific Assessment & Plan notes found for this encounter.   Past Medical History:  Diagnosis Date  . Asthma   . Hyperlipidemia   . Hypertension   . Restless leg     Past Surgical History:  Procedure Laterality Date  . ABDOMINAL HYSTERECTOMY    . COLONOSCOPY    . TONSILLECTOMY      No family history on file.  Social History   Socioeconomic History  . Marital status: Married    Spouse name: Not on file  . Number of children: Not on file  . Years of  education: Not on file  . Highest education level: Not on file  Occupational History  . Not on file  Social Needs  . Financial resource strain: Not on file  . Food insecurity:    Worry: Not on file    Inability: Not on file  . Transportation needs:    Medical: Not on file    Non-medical: Not on file  Tobacco Use  . Smoking status: Current Every Day Smoker  . Smokeless tobacco: Never Used  Substance and Sexual Activity  . Alcohol use: Yes    Alcohol/week: 0.6 oz    Types: 1 Glasses of wine per week  . Drug use: No  . Sexual activity: Not Currently  Lifestyle  . Physical activity:    Days per week: Not on file    Minutes per session: Not on file  . Stress: Not on file  Relationships  . Social connections:    Talks on phone: Not on file    Gets together: Not on file    Attends religious service: Not on file    Active member of club or organization: Not on file    Attends meetings of clubs or organizations: Not on file    Relationship status: Not on file  . Intimate partner violence:    Fear of current or ex partner: Not on file    Emotionally abused: Not on file    Physically abused: Not on file    Forced sexual activity: Not on file  Other Topics  Concern  . Not on file  Social History Narrative  . Not on file    No Known Allergies  Outpatient Medications Prior to Visit  Medication Sig Dispense Refill  . albuterol (PROVENTIL HFA;VENTOLIN HFA) 108 (90 Base) MCG/ACT inhaler Inhale 2 puffs into the lungs every 6 (six) hours as needed for wheezing or shortness of breath. 1 Inhaler 11  . aspirin 81 MG tablet Take 81 mg by mouth daily.    . budesonide-formoterol (SYMBICORT) 160-4.5 MCG/ACT inhaler Inhale 2 puffs into the lungs 2 (two) times daily. 1 Inhaler 11  . calcium carbonate 1250 MG capsule Take 1,250 mg by mouth 2 (two) times daily with a meal.    . guaiFENesin-codeine (ROBITUSSIN AC) 100-10 MG/5ML syrup Take 5 mLs by mouth 3 (three) times daily as needed for cough.  100 mL 0  . lovastatin (MEVACOR) 20 MG tablet Take 1 tablet (20 mg total) by mouth daily. 90 tablet 3  . montelukast (SINGULAIR) 10 MG tablet TAKE 1 TABLET(10 MG) BY MOUTH DAILY 90 tablet 0  . Multiple Vitamins-Minerals (CENTRUM SILVER ADULT 50+ PO) Take 1 capsule by mouth daily at 6 (six) AM.    . rOPINIRole (REQUIP) 0.25 MG tablet TAKE 1 TABLET(0.25 MG) BY MOUTH AT BEDTIME 30 tablet 0  . azithromycin (ZITHROMAX) 250 MG tablet 2 today then 1 a day for 4 days 6 tablet 0   No facility-administered medications prior to visit.     Review of Systems  Constitutional: Positive for fever. Negative for chills, malaise/fatigue and weight loss.  HENT: Negative for congestion, ear discharge, ear pain and sore throat.   Eyes: Negative for blurred vision.  Respiratory: Positive for cough. Negative for sputum production, shortness of breath and wheezing.   Cardiovascular: Negative for chest pain, palpitations and leg swelling.  Gastrointestinal: Positive for nausea. Negative for abdominal pain, blood in stool, constipation, diarrhea, heartburn, melena and vomiting.  Genitourinary: Negative for dysuria, frequency, hematuria and urgency.  Musculoskeletal: Negative for back pain, joint pain, myalgias and neck pain.  Skin: Negative for rash.  Neurological: Positive for headaches. Negative for dizziness, tingling, sensory change and focal weakness.  Endo/Heme/Allergies: Negative for environmental allergies and polydipsia. Does not bruise/bleed easily.  Psychiatric/Behavioral: Negative for depression and suicidal ideas. The patient is not nervous/anxious and does not have insomnia.      Objective  Vitals:   05/11/17 0848  BP: 120/70  Pulse: 100  Temp: 100.1 F (37.8 C)  SpO2: 91%  Weight: 146 lb (66.2 kg)  Height: 5\' 8"  (1.727 m)    Physical Exam  Constitutional: She is well-developed, well-nourished, and in no distress. No distress.  HENT:  Head: Normocephalic and atraumatic.  Right Ear:  External ear normal.  Left Ear: External ear normal.  Nose: Nose normal.  Mouth/Throat: Oropharynx is clear and moist.  Eyes: Pupils are equal, round, and reactive to light. Conjunctivae and EOM are normal. Right eye exhibits no discharge. Left eye exhibits no discharge.  Neck: Normal range of motion. Neck supple. No JVD present. No thyromegaly present.  Cardiovascular: Normal rate, regular rhythm, normal heart sounds and intact distal pulses. Exam reveals no gallop and no friction rub.  No murmur heard. Pulmonary/Chest: Effort normal. No accessory muscle usage. No respiratory distress. She has decreased breath sounds in the right middle field and the right lower field. She has no wheezes. She has no rales. Chest wall is dull to percussion.  Abdominal: Soft. Bowel sounds are normal. She exhibits no mass. There is  no tenderness. There is no guarding.  Musculoskeletal: Normal range of motion. She exhibits no edema.  Lymphadenopathy:    She has no cervical adenopathy.  Neurological: She is alert. She has normal reflexes.  Skin: Skin is warm and dry. She is not diaphoretic.  Psychiatric: Mood and affect normal.  Nursing note and vitals reviewed.     Assessment & Plan  Problem List Items Addressed This Visit    None    Visit Diagnoses    Pneumonia of right middle lobe due to infectious organism [facility])    -  Primary   Relevant Medications   cefUROXime (CEFTIN) 500 MG tablet   Other Relevant Orders   DG Chest 2 View   Fever and chills       Relevant Orders   POCT Influenza A/B (Completed)      Meds ordered this encounter  Medications  . cefUROXime (CEFTIN) 500 MG tablet    Sig: Take 1 tablet (500 mg total) by mouth 2 (two) times daily with a meal.    Dispense:  20 tablet    Refill:  0      Dr. Deanna Jones [facility]  05/11/17

## 2017-05-11 NOTE — Telephone Encounter (Signed)
Called to schedule AWV w/ [facility]. Pt declined to schedule today. States she might think about it in the future.

## 2017-05-11 NOTE — Telephone Encounter (Signed)
She has pneumonia right now, but otherwise I think if you try her next week she will schedule

## 2017-05-11 NOTE — Patient Instructions (Signed)

## 2017-05-12 ENCOUNTER — Ambulatory Visit (INDEPENDENT_AMBULATORY_CARE_PROVIDER_SITE_OTHER): Payer: Medicare PPO | Admitting: Family Medicine

## 2017-05-12 ENCOUNTER — Encounter: Payer: Self-pay | Admitting: Family Medicine

## 2017-05-12 VITALS — BP 124/80 | HR 68 | Temp 98.1°F | Ht 68.0 in | Wt 146.0 lb

## 2017-05-12 DIAGNOSIS — R911 Solitary pulmonary nodule: Secondary | ICD-10-CM

## 2017-05-12 DIAGNOSIS — J189 Pneumonia, unspecified organism: Secondary | ICD-10-CM

## 2017-05-12 DIAGNOSIS — J432 Centrilobular emphysema: Secondary | ICD-10-CM

## 2017-05-12 DIAGNOSIS — J181 Lobar pneumonia, unspecified organism: Secondary | ICD-10-CM | POA: Diagnosis not present

## 2017-05-12 NOTE — Progress Notes (Signed)
Name: Caroline Harris   MRN: [id number]    DOB: 1945/06/08   Date:05/12/2017       Progress Note  Subjective  Chief Complaint  Chief Complaint  Patient presents with  . Follow-up    pneumonia on xray and ? mass    Cough  This is a new problem. The current episode started in the past 7 days. The problem has been unchanged. The cough is productive of purulent sputum (green). Associated symptoms include a fever. Pertinent negatives include no chest pain, chills, ear congestion, ear pain, headaches, heartburn, hemoptysis, myalgias, nasal congestion, postnasal drip, rash, rhinorrhea, sore throat, shortness of breath, sweats, weight loss or wheezing. The symptoms are aggravated by pollens. She has tried a beta-agonist inhaler, leukotriene antagonists and prescription cough suppressant (ceftin) for the symptoms. The treatment provided no relief. Her past medical history is significant for emphysema. There is no history of environmental allergies.    No problem-specific Assessment & Plan notes found for this encounter.   Past Medical History:  Diagnosis Date  . Asthma   . Hyperlipidemia   . Hypertension   . Restless leg     Past Surgical History:  Procedure Laterality Date  . ABDOMINAL HYSTERECTOMY    . COLONOSCOPY    . TONSILLECTOMY      History reviewed. No pertinent family history.  Social History   Socioeconomic History  . Marital status: Married    Spouse name: Not on file  . Number of children: Not on file  . Years of education: Not on file  . Highest education level: Not on file  Occupational History  . Not on file  Social Needs  . Financial resource strain: Not on file  . Food insecurity:    Worry: Not on file    Inability: Not on file  . Transportation needs:    Medical: Not on file    Non-medical: Not on file  Tobacco Use  . Smoking status: Current Every Day Smoker  . Smokeless tobacco: Never Used  Substance and Sexual Activity  . Alcohol use: Yes   Alcohol/week: 0.6 oz    Types: 1 Glasses of wine per week  . Drug use: No  . Sexual activity: Not Currently  Lifestyle  . Physical activity:    Days per week: Not on file    Minutes per session: Not on file  . Stress: Not on file  Relationships  . Social connections:    Talks on phone: Not on file    Gets together: Not on file    Attends religious service: Not on file    Active member of club or organization: Not on file    Attends meetings of clubs or organizations: Not on file    Relationship status: Not on file  . Intimate partner violence:    Fear of current or ex partner: Not on file    Emotionally abused: Not on file    Physically abused: Not on file    Forced sexual activity: Not on file  Other Topics Concern  . Not on file  Social History Narrative  . Not on file    No Known Allergies  Outpatient Medications Prior to Visit  Medication Sig Dispense Refill  . albuterol (PROVENTIL HFA;VENTOLIN HFA) 108 (90 Base) MCG/ACT inhaler Inhale 2 puffs into the lungs every 6 (six) hours as needed for wheezing or shortness of breath. 1 Inhaler 11  . aspirin 81 MG tablet Take 81 mg by mouth daily.    Marland Kitchen  budesonide-formoterol (SYMBICORT) 160-4.5 MCG/ACT inhaler Inhale 2 puffs into the lungs 2 (two) times daily. 1 Inhaler 11  . calcium carbonate 1250 MG capsule Take 1,250 mg by mouth 2 (two) times daily with a meal.    . cefUROXime (CEFTIN) 500 MG tablet Take 1 tablet (500 mg total) by mouth 2 (two) times daily with a meal. 20 tablet 0  . guaiFENesin-codeine (ROBITUSSIN AC) 100-10 MG/5ML syrup Take 5 mLs by mouth 3 (three) times daily as needed for cough. 100 mL 0  . lovastatin (MEVACOR) 20 MG tablet Take 1 tablet (20 mg total) by mouth daily. 90 tablet 3  . montelukast (SINGULAIR) 10 MG tablet TAKE 1 TABLET(10 MG) BY MOUTH DAILY 90 tablet 0  . Multiple Vitamins-Minerals (CENTRUM SILVER ADULT 50+ PO) Take 1 capsule by mouth daily at 6 (six) AM.    . rOPINIRole (REQUIP) 0.25 MG tablet  TAKE 1 TABLET(0.25 MG) BY MOUTH AT BEDTIME 30 tablet 0   No facility-administered medications prior to visit.     Review of Systems  Constitutional: Positive for fever. Negative for chills, malaise/fatigue and weight loss.  HENT: Negative for ear discharge, ear pain, postnasal drip, rhinorrhea and sore throat.   Eyes: Negative for blurred vision.  Respiratory: Positive for cough. Negative for hemoptysis, sputum production, shortness of breath and wheezing.   Cardiovascular: Negative for chest pain, palpitations and leg swelling.  Gastrointestinal: Negative for abdominal pain, blood in stool, constipation, diarrhea, heartburn, melena and nausea.  Genitourinary: Negative for dysuria, frequency, hematuria and urgency.  Musculoskeletal: Negative for back pain, joint pain, myalgias and neck pain.  Skin: Negative for rash.  Neurological: Negative for dizziness, tingling, sensory change, focal weakness and headaches.  Endo/Heme/Allergies: Negative for environmental allergies and polydipsia. Does not bruise/bleed easily.  Psychiatric/Behavioral: Negative for depression and suicidal ideas. The patient is not nervous/anxious and does not have insomnia.      Objective  Vitals:   05/12/17 1003  BP: 124/80  Pulse: 68  Temp: 98.1 F (36.7 C)  TempSrc: Oral  SpO2: 95%  Weight: 146 lb (66.2 kg)  Height: 5\' 8"  (1.727 m)    Physical Exam  Constitutional: She is well-developed, well-nourished, and in no distress. No distress.  HENT:  Head: Normocephalic and atraumatic.  Right Ear: External ear normal.  Left Ear: External ear normal.  Nose: Nose normal.  Mouth/Throat: Oropharynx is clear and moist.  Eyes: Pupils are equal, round, and reactive to light. Conjunctivae and EOM are normal. Right eye exhibits no discharge. Left eye exhibits no discharge.  Neck: Normal range of motion. Neck supple. No JVD present. No thyromegaly present.  Cardiovascular: Normal rate, regular rhythm, normal heart  sounds and intact distal pulses. Exam reveals no gallop and no friction rub.  No murmur heard. Pulmonary/Chest: Effort normal. She has decreased breath sounds in the right lower field. She has no wheezes. She has no rales.  Abdominal: Soft. Bowel sounds are normal. She exhibits no mass. There is no tenderness. There is no guarding.  Musculoskeletal: Normal range of motion. She exhibits no edema.  Lymphadenopathy:    She has no cervical adenopathy.  Neurological: She is alert.  Skin: Skin is warm and dry. She is not diaphoretic.  Psychiatric: Mood and affect normal.  Nursing note and vitals reviewed.     Assessment & Plan  Problem List Items Addressed This Visit    None    Visit Diagnoses    Pneumonia of right lower lobe due to infectious organism ([place])    -  Primary   discuss with [facility]   Relevant Orders   CT Chest W Contrast   Pulmonary nodule       Relevant Orders   CT Chest W Contrast   Centrilobular emphysema ([place])          No orders of the defined types were placed in this encounter. I spent 35 minutes with this patient, More than 50% of that time was spent in face to face education, counseling and care coordination.    Dr. [facility]  05/12/17

## 2017-05-14 ENCOUNTER — Ambulatory Visit
Admission: RE | Admit: 2017-05-14 | Discharge: 2017-05-14 | Disposition: A | Discharge status: Home / Self Care | Payer: Medicare PPO | Source: Ambulatory Visit | Attending: Family Medicine | Admitting: Family Medicine

## 2017-05-14 ENCOUNTER — Other Ambulatory Visit: Payer: Self-pay

## 2017-05-14 DIAGNOSIS — J181 Lobar pneumonia, unspecified organism: Secondary | ICD-10-CM | POA: Insufficient documentation

## 2017-05-14 DIAGNOSIS — J189 Pneumonia, unspecified organism: Secondary | ICD-10-CM

## 2017-05-14 DIAGNOSIS — R911 Solitary pulmonary nodule: Secondary | ICD-10-CM | POA: Diagnosis not present

## 2017-05-14 MED ORDER — IOPAMIDOL (ISOVUE-300) INJECTION 61%
75.0000 mL | Freq: Once | INTRAVENOUS | Status: AC | PRN
  Administered 2017-05-14: 75 mL via INTRAVENOUS

## 2017-05-18 ENCOUNTER — Encounter: Payer: Self-pay | Admitting: Family Medicine

## 2017-05-18 ENCOUNTER — Ambulatory Visit (INDEPENDENT_AMBULATORY_CARE_PROVIDER_SITE_OTHER): Payer: Medicare PPO | Admitting: Family Medicine

## 2017-05-18 VITALS — BP 120/80 | HR 70 | Ht 68.0 in | Wt 146.0 lb

## 2017-05-18 DIAGNOSIS — I2510 Atherosclerotic heart disease of native coronary artery without angina pectoris: Secondary | ICD-10-CM

## 2017-05-18 DIAGNOSIS — I700 Atherosclerosis of aorta: Secondary | ICD-10-CM

## 2017-05-18 DIAGNOSIS — J181 Lobar pneumonia, unspecified organism: Secondary | ICD-10-CM | POA: Diagnosis not present

## 2017-05-18 DIAGNOSIS — J219 Acute bronchiolitis, unspecified: Secondary | ICD-10-CM | POA: Diagnosis not present

## 2017-05-18 DIAGNOSIS — J189 Pneumonia, unspecified organism: Secondary | ICD-10-CM

## 2017-05-18 DIAGNOSIS — J432 Centrilobular emphysema: Secondary | ICD-10-CM

## 2017-05-18 MED ORDER — GUAIFENESIN-CODEINE 100-10 MG/5ML PO SYRP: 5.0000 mL | ORAL_SOLUTION | Freq: Three times a day (TID) | ORAL | 0 refills | Status: DC | PRN

## 2017-05-18 MED ORDER — BENZONATATE 100 MG PO CAPS: 100.0000 mg | ORAL_CAPSULE | Freq: Two times a day (BID) | ORAL | 0 refills | Status: DC | PRN

## 2017-05-18 MED ORDER — CEFUROXIME AXETIL 500 MG PO TABS: 500.0000 mg | ORAL_TABLET | Freq: Two times a day (BID) | ORAL | 0 refills | Status: DC

## 2017-05-18 NOTE — Progress Notes (Signed)
Name: Caroline Harris   MRN: [id number]    DOB: [birth year]   Date:05/18/2017       Progress Note  Subjective  Chief Complaint  Chief Complaint  Patient presents with  . Follow-up    pneumonia- recheck O2 level/ refill on cough syrup and needs 5 pills of ceftin sent in due to dropping them into sink    Cough  This is a new (follow up) problem. The current episode started 1 to 4 weeks ago. The problem has been waxing and waning. The cough is non-productive. Pertinent negatives include no chest pain, chills, ear congestion, ear pain, fever, headaches, heartburn, hemoptysis, myalgias, nasal congestion, postnasal drip, rash, rhinorrhea, sore throat, shortness of breath, sweats, weight loss or wheezing. The symptoms are aggravated by pollens. The treatment provided moderate relief. Her past medical history is significant for COPD. There is no history of asthma, bronchiectasis, bronchitis, emphysema, environmental allergies or pneumonia.    No problem-specific Assessment & Plan notes found for this encounter.   Past Medical History:  Diagnosis Date  . Hyperlipidemia   . Restless leg     Past Surgical History:  Procedure Laterality Date  . ABDOMINAL HYSTERECTOMY    . COLONOSCOPY    . TONSILLECTOMY      History reviewed. No pertinent family history.  Social History   Socioeconomic History  . Marital status: Married    Spouse name: Not on file  . Number of children: Not on file  . Years of education: Not on file  . Highest education level: Not on file  Occupational History  . Not on file  Social Needs  . Financial resource strain: Not on file  . Food insecurity:    Worry: Not on file    Inability: Not on file  . Transportation needs:    Medical: Not on file    Non-medical: Not on file  Tobacco Use  . Smoking status: Current Every Day Smoker  . Smokeless tobacco: Never Used  Substance and Sexual Activity  . Alcohol use: Yes    Alcohol/week: 0.6 oz    Types: 1 Glasses of  wine per week  . Drug use: No  . Sexual activity: Not Currently  Lifestyle  . Physical activity:    Days per week: Not on file    Minutes per session: Not on file  . Stress: Not on file  Relationships  . Social connections:    Talks on phone: Not on file    Gets together: Not on file    Attends religious service: Not on file    Active member of club or organization: Not on file    Attends meetings of clubs or organizations: Not on file    Relationship status: Not on file  . Intimate partner violence:    Fear of current or ex partner: Not on file    Emotionally abused: Not on file    Physically abused: Not on file    Forced sexual activity: Not on file  Other Topics Concern  . Not on file  Social History Narrative  . Not on file    No Known Allergies  Outpatient Medications Prior to Visit  Medication Sig Dispense Refill  . albuterol (PROVENTIL HFA;VENTOLIN HFA) 108 (90 Base) MCG/ACT inhaler Inhale 2 puffs into the lungs every 6 (six) hours as needed for wheezing or shortness of breath. 1 Inhaler 11  . aspirin 81 MG tablet Take 81 mg by mouth daily.    . budesonide-formoterol (SYMBICORT)  160-4.5 MCG/ACT inhaler Inhale 2 puffs into the lungs 2 (two) times daily. 1 Inhaler 11  . calcium carbonate 1250 MG capsule Take 1,250 mg by mouth 2 (two) times daily with a meal.    . lovastatin (MEVACOR) 20 MG tablet Take 1 tablet (20 mg total) by mouth daily. 90 tablet 3  . montelukast (SINGULAIR) 10 MG tablet TAKE 1 TABLET(10 MG) BY MOUTH DAILY 90 tablet 0  . Multiple Vitamins-Minerals (CENTRUM SILVER ADULT 50+ PO) Take 1 capsule by mouth daily at 6 (six) AM.    . rOPINIRole (REQUIP) 0.25 MG tablet TAKE 1 TABLET(0.25 MG) BY MOUTH AT BEDTIME 30 tablet 0  . cefUROXime (CEFTIN) 500 MG tablet Take 1 tablet (500 mg total) by mouth 2 (two) times daily with a meal. 20 tablet 0  . guaiFENesin-codeine (ROBITUSSIN AC) 100-10 MG/5ML syrup Take 5 mLs by mouth 3 (three) times daily as needed for cough.  100 mL 0   No facility-administered medications prior to visit.     Review of Systems  Constitutional: Negative for chills, fever, malaise/fatigue and weight loss.  HENT: Negative for ear discharge, ear pain, postnasal drip, rhinorrhea and sore throat.   Eyes: Negative for blurred vision.  Respiratory: Positive for cough. Negative for hemoptysis, sputum production, shortness of breath and wheezing.   Cardiovascular: Negative for chest pain, palpitations and leg swelling.  Gastrointestinal: Negative for abdominal pain, blood in stool, constipation, diarrhea, heartburn, melena and nausea.  Genitourinary: Negative for dysuria, frequency, hematuria and urgency.  Musculoskeletal: Negative for back pain, joint pain, myalgias and neck pain.  Skin: Negative for rash.  Neurological: Negative for dizziness, tingling, sensory change, focal weakness and headaches.  Endo/Heme/Allergies: Negative for environmental allergies and polydipsia. Does not bruise/bleed easily.  Psychiatric/Behavioral: Negative for depression and suicidal ideas. The patient is not nervous/anxious and does not have insomnia.      Objective  Vitals:   05/18/17 1524  BP: 120/80  Pulse: 70  SpO2: 97%  Weight: 146 lb (66.2 kg)  Height: 5\' 8"  (1.727 m)    Physical Exam  Constitutional: She is well-developed, well-nourished, and in no distress. No distress.  HENT:  Head: Normocephalic and atraumatic.  Right Ear: External ear normal.  Left Ear: External ear normal.  Nose: Nose normal.  Mouth/Throat: Oropharynx is clear and moist.  Eyes: Pupils are equal, round, and reactive to light. Conjunctivae and EOM are normal. Right eye exhibits no discharge. Left eye exhibits no discharge.  Neck: Normal range of motion. Neck supple. No JVD present. No thyromegaly present.  Cardiovascular: Normal rate, regular rhythm, normal heart sounds and intact distal pulses. Exam reveals no gallop and no friction rub.  No murmur  heard. Pulmonary/Chest: Effort normal and breath sounds normal. She has no wheezes. She has no rales.  Abdominal: Soft. Bowel sounds are normal. She exhibits no mass. There is no tenderness. There is no guarding.  Musculoskeletal: Normal range of motion. She exhibits no edema.  Lymphadenopathy:    She has no cervical adenopathy.  Neurological: She is alert. She has normal reflexes.  Skin: Skin is warm and dry. She is not diaphoretic.  Psychiatric: Mood and affect normal.  Nursing note and vitals reviewed.     Assessment & Plan  Problem List Items Addressed This Visit    None    Visit Diagnoses    Pneumonia of right middle lobe due to infectious organism [facility])    -  Primary   Relevant Medications   cefUROXime (CEFTIN) 500 MG  tablet   guaiFENesin-codeine (ROBITUSSIN AC) 100-10 MG/5ML syrup   benzonatate (TESSALON) 100 MG capsule   Bronchiolitis       Relevant Medications   guaiFENesin-codeine (ROBITUSSIN AC) 100-10 MG/5ML syrup   Centrilobular emphysema (HCC)       Relevant Medications   guaiFENesin-codeine (ROBITUSSIN AC) 100-10 MG/5ML syrup   benzonatate (TESSALON) 100 MG capsule   Aortic atherosclerosis (HCC)       Atherosclerosis of coronary artery of native heart without angina pectoris, unspecified vessel or lesion type          Meds ordered this encounter  Medications  . cefUROXime (CEFTIN) 500 MG tablet    Sig: Take 1 tablet (500 mg total) by mouth 2 (two) times daily with a meal.    Dispense:  8 tablet    Refill:  0  . guaiFENesin-codeine (ROBITUSSIN AC) 100-10 MG/5ML syrup    Sig: Take 5 mLs by mouth 3 (three) times daily as needed for cough.    Dispense:  100 mL    Refill:  0  . benzonatate (TESSALON) 100 MG capsule    Sig: Take 1 capsule (100 mg total) by mouth 2 (two) times daily as needed for cough.    Dispense:  20 capsule    Refill:  0      Dr. Otilio Miu [facility]  05/18/17

## 2017-05-27 ENCOUNTER — Other Ambulatory Visit: Payer: Self-pay | Admitting: Family Medicine

## 2017-05-27 DIAGNOSIS — J219 Acute bronchiolitis, unspecified: Secondary | ICD-10-CM

## 2017-05-27 DIAGNOSIS — J441 Chronic obstructive pulmonary disease with (acute) exacerbation: Secondary | ICD-10-CM

## 2017-06-01 ENCOUNTER — Ambulatory Visit (INDEPENDENT_AMBULATORY_CARE_PROVIDER_SITE_OTHER): Payer: Medicare PPO | Admitting: Family Medicine

## 2017-06-01 ENCOUNTER — Encounter: Payer: Self-pay | Admitting: Family Medicine

## 2017-06-01 VITALS — BP 120/64 | HR 68 | Ht 68.0 in | Wt 146.0 lb

## 2017-06-01 DIAGNOSIS — S29011A Strain of muscle and tendon of front wall of thorax, initial encounter: Secondary | ICD-10-CM | POA: Diagnosis not present

## 2017-06-01 DIAGNOSIS — J189 Pneumonia, unspecified organism: Secondary | ICD-10-CM | POA: Diagnosis not present

## 2017-06-01 DIAGNOSIS — M940 Chondrocostal junction syndrome [Tietze]: Secondary | ICD-10-CM | POA: Diagnosis not present

## 2017-06-01 DIAGNOSIS — R432 Parageusia: Secondary | ICD-10-CM

## 2017-06-01 DIAGNOSIS — J4520 Mild intermittent asthma, uncomplicated: Secondary | ICD-10-CM

## 2017-06-01 DIAGNOSIS — R5383 Other fatigue: Secondary | ICD-10-CM

## 2017-06-01 NOTE — Progress Notes (Signed)
Name: Caroline Harris   MRN: [id number]    DOB: March 10, 1945   Date:06/01/2017       Progress Note  Subjective  Chief Complaint  Chief Complaint  Patient presents with  . Chest Pain    Pain on R) front side of chest over the ribs- still has cough- little production    Chest Pain   This is a new problem. The current episode started in the past 7 days. The onset quality is gradual. The problem occurs intermittently. The problem has been waxing and waning. The pain is present in the substernal region and lateral region. The pain is moderate. The quality of the pain is described as dull. Associated symptoms include a cough. Pertinent negatives include no abdominal pain, back pain, dizziness, fever, headaches, hemoptysis, malaise/fatigue, nausea, palpitations, shortness of breath or sputum production. The cough is non-productive.    No problem-specific Assessment & Plan notes found for this encounter.   Past Medical History:  Diagnosis Date  . Hyperlipidemia   . Restless leg     Past Surgical History:  Procedure Laterality Date  . ABDOMINAL HYSTERECTOMY    . COLONOSCOPY    . TONSILLECTOMY      History reviewed. No pertinent family history.  Social History   Socioeconomic History  . Marital status: Married    Spouse name: Not on file  . Number of children: Not on file  . Years of education: Not on file  . Highest education level: Not on file  Occupational History  . Not on file  Social Needs  . Financial resource strain: Not on file  . Food insecurity:    Worry: Not on file    Inability: Not on file  . Transportation needs:    Medical: Not on file    Non-medical: Not on file  Tobacco Use  . Smoking status: Current Every Day Smoker  . Smokeless tobacco: Never Used  Substance and Sexual Activity  . Alcohol use: Yes    Alcohol/week: 0.6 oz    Types: 1 Glasses of wine per week  . Drug use: No  . Sexual activity: Not Currently  Lifestyle  . Physical activity:    Days  per week: Not on file    Minutes per session: Not on file  . Stress: Not on file  Relationships  . Social connections:    Talks on phone: Not on file    Gets together: Not on file    Attends religious service: Not on file    Active member of club or organization: Not on file    Attends meetings of clubs or organizations: Not on file    Relationship status: Not on file  . Intimate partner violence:    Fear of current or ex partner: Not on file    Emotionally abused: Not on file    Physically abused: Not on file    Forced sexual activity: Not on file  Other Topics Concern  . Not on file  Social History Narrative  . Not on file    No Known Allergies  Outpatient Medications Prior to Visit  Medication Sig Dispense Refill  . albuterol (PROVENTIL HFA;VENTOLIN HFA) 108 (90 Base) MCG/ACT inhaler Inhale 2 puffs into the lungs every 6 (six) hours as needed for wheezing or shortness of breath. 1 Inhaler 11  . aspirin 81 MG tablet Take 81 mg by mouth daily.    . benzonatate (TESSALON) 100 MG capsule Take 1 capsule (100 mg total) by mouth  2 (two) times daily as needed for cough. 20 capsule 0  . budesonide-formoterol (SYMBICORT) 160-4.5 MCG/ACT inhaler Inhale 2 puffs into the lungs 2 (two) times daily. 1 Inhaler 11  . calcium carbonate 1250 MG capsule Take 1,250 mg by mouth 2 (two) times daily with a meal.    . guaiFENesin-codeine (ROBITUSSIN AC) 100-10 MG/5ML syrup Take 5 mLs by mouth 3 (three) times daily as needed for cough. 100 mL 0  . lovastatin (MEVACOR) 20 MG tablet Take 1 tablet (20 mg total) by mouth daily. 90 tablet 3  . montelukast (SINGULAIR) 10 MG tablet TAKE 1 TABLET(10 MG) BY MOUTH DAILY 90 tablet 0  . Multiple Vitamins-Minerals (CENTRUM SILVER ADULT 50+ PO) Take 1 capsule by mouth daily at 6 (six) AM.    . rOPINIRole (REQUIP) 0.25 MG tablet TAKE 1 TABLET(0.25 MG) BY MOUTH AT BEDTIME 30 tablet 0  . cefUROXime (CEFTIN) 500 MG tablet Take 1 tablet (500 mg total) by mouth 2 (two)  times daily with a meal. 8 tablet 0   No facility-administered medications prior to visit.     Review of Systems  Constitutional: Negative for chills, fever, malaise/fatigue and weight loss.  HENT: Negative for ear discharge, ear pain and sore throat.   Eyes: Negative for blurred vision.  Respiratory: Positive for cough. Negative for hemoptysis, sputum production, shortness of breath and wheezing.   Cardiovascular: Positive for chest pain. Negative for palpitations and leg swelling.  Gastrointestinal: Negative for abdominal pain, blood in stool, constipation, diarrhea, heartburn, melena and nausea.  Genitourinary: Negative for dysuria, frequency, hematuria and urgency.  Musculoskeletal: Negative for back pain, joint pain, myalgias and neck pain.  Skin: Negative for rash.  Neurological: Negative for dizziness, tingling, sensory change, focal weakness and headaches.  Endo/Heme/Allergies: Negative for environmental allergies and polydipsia. Does not bruise/bleed easily.  Psychiatric/Behavioral: Negative for depression and suicidal ideas. The patient is not nervous/anxious and does not have insomnia.      Objective  Vitals:   06/01/17 1040  BP: 120/64  Pulse: 68  SpO2: 99%  Weight: 146 lb (66.2 kg)  Height: 5\' 8"  (1.727 m)    Physical Exam  Constitutional: No distress.  HENT:  Head: Normocephalic and atraumatic.  Right Ear: External ear normal.  Left Ear: External ear normal.  Nose: Nose normal.  Mouth/Throat: Oropharynx is clear and moist.  Eyes: Pupils are equal, round, and reactive to light. Conjunctivae and EOM are normal. Right eye exhibits no discharge. Left eye exhibits no discharge.  Neck: Normal range of motion. Neck supple. No JVD present. No thyromegaly present.  Cardiovascular: Normal rate, regular rhythm, normal heart sounds and intact distal pulses. Exam reveals no gallop and no friction rub.  No murmur heard. Pulmonary/Chest: Effort normal and breath sounds  normal. No accessory muscle usage. No apnea, no tachypnea and no bradypnea. No respiratory distress. She has no decreased breath sounds. She has no wheezes. She has no rhonchi. She has no rales. Chest wall is not dull to percussion. She exhibits bony tenderness.  Tender left costochondral  6th/7th/ right anterior 8th/9th intercostal space  Abdominal: Soft. Bowel sounds are normal. She exhibits no mass. There is no tenderness. There is no guarding.  Musculoskeletal: Normal range of motion. She exhibits no edema.  Lymphadenopathy:    She has no cervical adenopathy.  Neurological: She is alert. She has normal reflexes.  Skin: Skin is warm and dry. She is not diaphoretic.  Nursing note and vitals reviewed.     Assessment &  Plan  Problem List Items Addressed This Visit      Respiratory   Asthma, mild intermittent    Other Visit Diagnoses    Multifocal pneumonia    -  Primary   repeat chest 2-3 weeks   Relevant Orders   CBC with Differential/Platelet   Costochondritis       Nsaid of choice   Intercostal muscle strain, initial encounter       Dysgeusia       Fatigue, unspecified type       Relevant Orders   CBC with Differential/Platelet   TSH      No orders of the defined types were placed in this encounter.     Dr. [facility]  06/01/17

## 2017-06-02 LAB — CBC WITH DIFFERENTIAL/PLATELET
BASOS ABS: 0.1 10*3/uL (ref 0.0–0.2)
Basos: 1 %
EOS (ABSOLUTE): 0.1 10*3/uL (ref 0.0–0.4)
Eos: 1 %
Hematocrit: 36.9 % (ref 34.0–46.6)
Hemoglobin: 12 g/dL (ref 11.1–15.9)
IMMATURE GRANULOCYTES: 0 %
Immature Grans (Abs): 0 10*3/uL (ref 0.0–0.1)
LYMPHS ABS: 1.6 10*3/uL (ref 0.7–3.1)
Lymphs: 16 %
MCH: 30.5 pg (ref 26.6–33.0)
MCHC: 32.5 g/dL (ref 31.5–35.7)
MCV: 94 fL (ref 79–97)
MONOCYTES: 10 %
MONOS ABS: 1 10*3/uL — AB (ref 0.1–0.9)
NEUTROS PCT: 72 %
Neutrophils Absolute: 7.1 10*3/uL — ABNORMAL HIGH (ref 1.4–7.0)
PLATELETS: 418 10*3/uL — AB (ref 150–379)
RBC: 3.93 x10E6/uL (ref 3.77–5.28)
RDW: 13.8 % (ref 12.3–15.4)
WBC: 9.9 10*3/uL (ref 3.4–10.8)

## 2017-06-02 LAB — TSH: TSH: 1.12 u[IU]/mL (ref 0.450–4.500)

## 2017-06-15 ENCOUNTER — Telehealth: Payer: Self-pay

## 2017-06-15 NOTE — Telephone Encounter (Signed)
Calling to remind Caroline Harris to call her as she needs chest films and is not sure if needs appt or how to get these done.

## 2017-06-16 ENCOUNTER — Other Ambulatory Visit: Payer: Medicare PPO

## 2017-06-16 ENCOUNTER — Ambulatory Visit
Admission: RE | Admit: 2017-06-16 | Discharge: 2017-06-16 | Disposition: A | Discharge status: Home / Self Care | Payer: Medicare PPO | Source: Ambulatory Visit | Attending: Family Medicine | Admitting: Family Medicine

## 2017-06-16 DIAGNOSIS — J189 Pneumonia, unspecified organism: Secondary | ICD-10-CM

## 2017-06-16 DIAGNOSIS — Z8701 Personal history of pneumonia (recurrent): Secondary | ICD-10-CM | POA: Diagnosis not present

## 2017-06-16 DIAGNOSIS — Z09 Encounter for follow-up examination after completed treatment for conditions other than malignant neoplasm: Secondary | ICD-10-CM | POA: Diagnosis not present

## 2017-06-19 ENCOUNTER — Telehealth: Payer: Self-pay | Admitting: Family Medicine

## 2017-06-19 NOTE — Telephone Encounter (Signed)
Called to schedule Medicare Annual Wellness Visit with Nurse Health Advisor. °Thank you! °For any questions please contact: °Kathryn Brown °[phone]  °Skype [email or web address]  ° °

## 2017-06-22 ENCOUNTER — Other Ambulatory Visit
Admission: RE | Admit: 2017-06-22 | Discharge: 2017-06-22 | Disposition: A | Discharge status: Home / Self Care | Payer: Medicare PPO | Source: Ambulatory Visit | Attending: Family Medicine | Admitting: Family Medicine

## 2017-06-22 ENCOUNTER — Encounter: Payer: Self-pay | Admitting: Family Medicine

## 2017-06-22 ENCOUNTER — Ambulatory Visit (INDEPENDENT_AMBULATORY_CARE_PROVIDER_SITE_OTHER): Payer: Medicare PPO | Admitting: Family Medicine

## 2017-06-22 VITALS — BP 110/70 | HR 78 | Temp 99.6°F | Ht 68.0 in | Wt 148.0 lb

## 2017-06-22 DIAGNOSIS — R51 Headache: Secondary | ICD-10-CM | POA: Diagnosis not present

## 2017-06-22 DIAGNOSIS — R509 Fever, unspecified: Secondary | ICD-10-CM

## 2017-06-22 LAB — CBC WITH DIFFERENTIAL/PLATELET
BASOS ABS: 0.1 10*3/uL (ref 0–0.1)
BASOS PCT: 1 %
Eosinophils Absolute: 0.1 10*3/uL (ref 0–0.7)
Eosinophils Relative: 1 %
HEMATOCRIT: 34 % — AB (ref 35.0–47.0)
HEMOGLOBIN: 11.7 g/dL — AB (ref 12.0–16.0)
LYMPHS PCT: 16 %
Lymphs Abs: 1.4 10*3/uL (ref 1.0–3.6)
MCH: 31.2 pg (ref 26.0–34.0)
MCHC: 34.2 g/dL (ref 32.0–36.0)
MCV: 91 fL (ref 80.0–100.0)
MONOS PCT: 7 %
Monocytes Absolute: 0.6 10*3/uL (ref 0.2–0.9)
NEUTROS ABS: 6.5 10*3/uL (ref 1.4–6.5)
NEUTROS PCT: 75 %
Platelets: 241 10*3/uL (ref 150–440)
RBC: 3.74 MIL/uL — ABNORMAL LOW (ref 3.80–5.20)
RDW: 14.8 % — ABNORMAL HIGH (ref 11.5–14.5)
WBC: 8.7 10*3/uL (ref 3.6–11.0)

## 2017-06-22 MED ORDER — DOXYCYCLINE HYCLATE 100 MG PO TABS: 100.0000 mg | ORAL_TABLET | Freq: Two times a day (BID) | ORAL | 0 refills | Status: DC

## 2017-06-22 NOTE — Progress Notes (Signed)
Name: Caroline Harris   MRN: [id number]    DOB: Feb 22, 1945   Date:06/22/2017       Progress Note  Subjective  Chief Complaint  Chief Complaint  Patient presents with  . Fever    102 last night, chills, productive cough/ feels like it is in throat, nasal drainage    Fever   This is a new problem. The current episode started in the past 7 days (Saturday). The problem occurs daily. The problem has been waxing and waning. The maximum temperature noted was 102 to 102.9 F. The temperature was taken using an oral thermometer. Associated symptoms include congestion, coughing, headaches and muscle aches. Pertinent negatives include no abdominal pain, chest pain, diarrhea, ear pain, nausea, rash, sleepiness, sore throat, urinary pain, vomiting or wheezing. She has tried nothing for the symptoms.  Risk factors: no recent sickness, no recent travel and no sick contacts     No problem-specific Assessment & Plan notes found for this encounter.   Past Medical History:  Diagnosis Date  . Hyperlipidemia   . Restless leg     Past Surgical History:  Procedure Laterality Date  . ABDOMINAL HYSTERECTOMY    . COLONOSCOPY    . TONSILLECTOMY      History reviewed. No pertinent family history.  Social History   Socioeconomic History  . Marital status: Married    Spouse name: Not on file  . Number of children: Not on file  . Years of education: Not on file  . Highest education level: Not on file  Occupational History  . Not on file  Social Needs  . Financial resource strain: Not on file  . Food insecurity:    Worry: Not on file    Inability: Not on file  . Transportation needs:    Medical: Not on file    Non-medical: Not on file  Tobacco Use  . Smoking status: Current Every Day Smoker  . Smokeless tobacco: Never Used  Substance and Sexual Activity  . Alcohol use: Yes    Alcohol/week: 0.6 oz    Types: 1 Glasses of wine per week  . Drug use: No  . Sexual activity: Not Currently   Lifestyle  . Physical activity:    Days per week: Not on file    Minutes per session: Not on file  . Stress: Not on file  Relationships  . Social connections:    Talks on phone: Not on file    Gets together: Not on file    Attends religious service: Not on file    Active member of club or organization: Not on file    Attends meetings of clubs or organizations: Not on file    Relationship status: Not on file  . Intimate partner violence:    Fear of current or ex partner: Not on file    Emotionally abused: Not on file    Physically abused: Not on file    Forced sexual activity: Not on file  Other Topics Concern  . Not on file  Social History Narrative  . Not on file    No Known Allergies  Outpatient Medications Prior to Visit  Medication Sig Dispense Refill  . aspirin 81 MG tablet Take 81 mg by mouth daily.    . budesonide-formoterol (SYMBICORT) 160-4.5 MCG/ACT inhaler Inhale 2 puffs into the lungs 2 (two) times daily. 1 Inhaler 11  . calcium carbonate 1250 MG capsule Take 1,250 mg by mouth 2 (two) times daily with a meal.    .  guaiFENesin-codeine (ROBITUSSIN AC) 100-10 MG/5ML syrup Take 5 mLs by mouth 3 (three) times daily as needed for cough. 100 mL 0  . lovastatin (MEVACOR) 20 MG tablet Take 1 tablet (20 mg total) by mouth daily. 90 tablet 3  . montelukast (SINGULAIR) 10 MG tablet TAKE 1 TABLET(10 MG) BY MOUTH DAILY 90 tablet 0  . Multiple Vitamins-Minerals (CENTRUM SILVER ADULT 50+ PO) Take 1 capsule by mouth daily at 6 (six) AM.    . rOPINIRole (REQUIP) 0.25 MG tablet TAKE 1 TABLET(0.25 MG) BY MOUTH AT BEDTIME 30 tablet 0  . albuterol (PROVENTIL HFA;VENTOLIN HFA) 108 (90 Base) MCG/ACT inhaler Inhale 2 puffs into the lungs every 6 (six) hours as needed for wheezing or shortness of breath. (Patient not taking: Reported on 06/22/2017) 1 Inhaler 11  . benzonatate (TESSALON) 100 MG capsule Take 1 capsule (100 mg total) by mouth 2 (two) times daily as needed for cough. (Patient not  taking: Reported on 06/22/2017) 20 capsule 0   No facility-administered medications prior to visit.     Review of Systems  Constitutional: Positive for chills and fever. Negative for diaphoresis, malaise/fatigue and weight loss.  HENT: Positive for congestion. Negative for ear discharge, ear pain and sore throat.   Eyes: Negative for blurred vision.  Respiratory: Positive for cough. Negative for hemoptysis, sputum production, shortness of breath and wheezing.   Cardiovascular: Negative for chest pain, palpitations and leg swelling.  Gastrointestinal: Negative for abdominal pain, blood in stool, constipation, diarrhea, heartburn, melena, nausea and vomiting.  Genitourinary: Negative for dysuria, frequency, hematuria and urgency.  Musculoskeletal: Negative for back pain, joint pain, myalgias and neck pain.  Skin: Negative for itching and rash.  Neurological: Positive for headaches. Negative for dizziness, tingling, sensory change and focal weakness.  Endo/Heme/Allergies: Negative for environmental allergies and polydipsia. Does not bruise/bleed easily.  Psychiatric/Behavioral: Negative for depression and suicidal ideas. The patient is not nervous/anxious and does not have insomnia.      Objective  Vitals:   06/22/17 1507  BP: 110/70  Pulse: 78  Temp: 99.6 F (37.6 C)  TempSrc: Oral  SpO2: 97%  Weight: 148 lb (67.1 kg)  Height: 5\' 8"  (1.727 m)    Physical Exam  Constitutional: She is oriented to person, place, and time. She appears well-developed and well-nourished.  HENT:  Head: Normocephalic.  Right Ear: External ear normal.  Left Ear: External ear normal.  Mouth/Throat: Oropharynx is clear and moist.  Eyes: Pupils are equal, round, and reactive to light. Conjunctivae and EOM are normal. Lids are everted and swept, no foreign bodies found. Left eye exhibits no hordeolum. No foreign body present in the left eye. Right conjunctiva is not injected. Left conjunctiva is not  injected. No scleral icterus.  Neck: Normal range of motion. Neck supple. No JVD present. No tracheal deviation present. No thyromegaly present.  Cardiovascular: Normal rate, regular rhythm, normal heart sounds and intact distal pulses. Exam reveals no gallop and no friction rub.  No murmur heard. Pulmonary/Chest: Effort normal. No respiratory distress. She has decreased breath sounds in the right lower field. She has no wheezes. She has no rales.  Abdominal: Soft. Bowel sounds are normal. She exhibits no mass. There is no hepatosplenomegaly. There is no tenderness. There is no rebound and no guarding.  Musculoskeletal: Normal range of motion. She exhibits no edema or tenderness.  Lymphadenopathy:    She has no cervical adenopathy.  Neurological: She is alert and oriented to person, place, and time. She has normal strength.  She displays normal reflexes. No cranial nerve deficit.  Skin: Skin is warm. No rash noted.  Psychiatric: She has a normal mood and affect. Her mood appears not anxious. She does not exhibit a depressed mood.  Nursing note and vitals reviewed.     Assessment & Plan  Problem List Items Addressed This Visit    None    Visit Diagnoses    Fever and chills    -  Primary   [id number] igg/igm   Relevant Medications   doxycycline (VIBRA-TABS) 100 MG tablet      Meds ordered this encounter  Medications  . doxycycline (VIBRA-TABS) 100 MG tablet    Sig: Take 1 tablet (100 mg total) by mouth 2 (two) times daily.    Dispense:  20 tablet    Refill:  0      Dr. Deanna Jones [facility]  06/22/17

## 2017-06-25 LAB — ROCKY MTN SPOTTED FVR ABS PNL(IGG+IGM)
RMSF IgG: NEGATIVE
RMSF IgM: 0.14 index (ref 0.00–0.89)

## 2017-08-25 ENCOUNTER — Other Ambulatory Visit: Payer: Self-pay | Admitting: Family Medicine

## 2017-08-25 DIAGNOSIS — J219 Acute bronchiolitis, unspecified: Secondary | ICD-10-CM

## 2017-08-25 DIAGNOSIS — J441 Chronic obstructive pulmonary disease with (acute) exacerbation: Secondary | ICD-10-CM

## 2017-10-22 ENCOUNTER — Encounter: Payer: Self-pay | Admitting: Family Medicine

## 2017-10-22 ENCOUNTER — Ambulatory Visit (INDEPENDENT_AMBULATORY_CARE_PROVIDER_SITE_OTHER): Payer: Medicare PPO | Admitting: Family Medicine

## 2017-10-22 VITALS — BP 120/78 | HR 62 | Temp 98.0°F | Ht 68.0 in | Wt 149.0 lb

## 2017-10-22 DIAGNOSIS — K529 Noninfective gastroenteritis and colitis, unspecified: Secondary | ICD-10-CM

## 2017-10-22 NOTE — Progress Notes (Signed)
Name: Caroline Harris   MRN: [id number]    DOB: 29-Jun-1945   Date:10/22/2017       Progress Note  Subjective  Chief Complaint  Chief Complaint  Patient presents with  . Diarrhea    has had for 2 weeks    Diarrhea   This is a new problem. The current episode started 1 to 4 weeks ago. The problem has been waxing and waning. The stool consistency is described as watery. The patient states that diarrhea awakens her from sleep. Associated symptoms include abdominal pain and bloating. Pertinent negatives include no arthralgias, chills, coughing, fever, headaches, increased  flatus, myalgias, sweats, URI, vomiting or weight loss. She has tried anti-motility drug for the symptoms. The treatment provided no relief. simulsr episode early 2019    No problem-specific Assessment & Plan notes found for this encounter.   Past Medical History:  Diagnosis Date  . Hyperlipidemia   . Restless leg     Past Surgical History:  Procedure Laterality Date  . ABDOMINAL HYSTERECTOMY    . COLONOSCOPY    . TONSILLECTOMY      No family history on file.  Social History   Socioeconomic History  . Marital status: Married    Spouse name: Not on file  . Number of children: Not on file  . Years of education: Not on file  . Highest education level: Not on file  Occupational History  . Not on file  Social Needs  . Financial resource strain: Not on file  . Food insecurity:    Worry: Not on file    Inability: Not on file  . Transportation needs:    Medical: Not on file    Non-medical: Not on file  Tobacco Use  . Smoking status: Current Every Day Smoker  . Smokeless tobacco: Never Used  Substance and Sexual Activity  . Alcohol use: Yes    Alcohol/week: 1.0 standard drinks    Types: 1 Glasses of wine per week  . Drug use: No  . Sexual activity: Not Currently  Lifestyle  . Physical activity:    Days per week: Not on file    Minutes per session: Not on file  . Stress: Not on file  Relationships  .  Social connections:    Talks on phone: Not on file    Gets together: Not on file    Attends religious service: Not on file    Active member of club or organization: Not on file    Attends meetings of clubs or organizations: Not on file    Relationship status: Not on file  . Intimate partner violence:    Fear of current or ex partner: Not on file    Emotionally abused: Not on file    Physically abused: Not on file    Forced sexual activity: Not on file  Other Topics Concern  . Not on file  Social History Narrative  . Not on file    No Known Allergies  Outpatient Medications Prior to Visit  Medication Sig Dispense Refill  . aspirin 81 MG tablet Take 81 mg by mouth daily.    . budesonide-formoterol (SYMBICORT) 160-4.5 MCG/ACT inhaler Inhale 2 puffs into the lungs 2 (two) times daily. 1 Inhaler 11  . calcium carbonate 1250 MG capsule Take 1,250 mg by mouth 2 (two) times daily with a meal.    . Lactobacillus (PROBIOTIC ACIDOPHILUS PO) Take 1 capsule by mouth daily.    Marland Kitchen lovastatin (MEVACOR) 20 MG tablet Take  1 tablet (20 mg total) by mouth daily. 90 tablet 3  . montelukast (SINGULAIR) 10 MG tablet TAKE 1 TABLET(10 MG) BY MOUTH DAILY 90 tablet 0  . Multiple Vitamins-Minerals (CENTRUM SILVER ADULT 50+ PO) Take 1 capsule by mouth daily at 6 (six) AM.    . albuterol (PROVENTIL HFA;VENTOLIN HFA) 108 (90 Base) MCG/ACT inhaler Inhale 2 puffs into the lungs every 6 (six) hours as needed for wheezing or shortness of breath. (Patient not taking: Reported on 06/22/2017) 1 Inhaler 11  . benzonatate (TESSALON) 100 MG capsule Take 1 capsule (100 mg total) by mouth 2 (two) times daily as needed for cough. (Patient not taking: Reported on 06/22/2017) 20 capsule 0  . rOPINIRole (REQUIP) 0.25 MG tablet TAKE 1 TABLET(0.25 MG) BY MOUTH AT BEDTIME 30 tablet 0  . doxycycline (VIBRA-TABS) 100 MG tablet Take 1 tablet (100 mg total) by mouth 2 (two) times daily. 20 tablet 0  . guaiFENesin-codeine (ROBITUSSIN AC)  100-10 MG/5ML syrup Take 5 mLs by mouth 3 (three) times daily as needed for cough. 100 mL 0   No facility-administered medications prior to visit.     Review of Systems  Constitutional: Negative for chills, fever, malaise/fatigue and weight loss.  HENT: Negative for ear discharge, ear pain and sore throat.   Eyes: Negative for blurred vision.  Respiratory: Negative for cough, sputum production, shortness of breath and wheezing.   Cardiovascular: Negative for chest pain, palpitations and leg swelling.  Gastrointestinal: Positive for abdominal pain, bloating and diarrhea. Negative for blood in stool, constipation, flatus, heartburn, melena, nausea and vomiting.  Genitourinary: Negative for dysuria, frequency, hematuria and urgency.  Musculoskeletal: Negative for arthralgias, back pain, joint pain, myalgias and neck pain.  Skin: Negative for rash.  Neurological: Negative for dizziness, tingling, sensory change, focal weakness and headaches.  Endo/Heme/Allergies: Negative for environmental allergies and polydipsia. Does not bruise/bleed easily.  Psychiatric/Behavioral: Negative for depression and suicidal ideas. The patient is not nervous/anxious and does not have insomnia.      Objective  Vitals:   10/22/17 1419  BP: 120/78  Pulse: 62  Temp: 98 F (36.7 C)  TempSrc: Oral  Weight: 149 lb (67.6 kg)  Height: 5\' 8"  (1.727 m)    Physical Exam  Constitutional: She is oriented to person, place, and time. She appears well-developed and well-nourished.  HENT:  Head: Normocephalic.  Right Ear: External ear normal.  Left Ear: External ear normal.  Mouth/Throat: Oropharynx is clear and moist.  Eyes: Pupils are equal, round, and reactive to light. Conjunctivae and EOM are normal. Lids are everted and swept, no foreign bodies found. Left eye exhibits no hordeolum. No foreign body present in the left eye. Right conjunctiva is not injected. Left conjunctiva is not injected. No scleral icterus.   Neck: Normal range of motion. Neck supple. No JVD present. No tracheal deviation present. No thyromegaly present.  Cardiovascular: Normal rate, regular rhythm, normal heart sounds and intact distal pulses. Exam reveals no gallop and no friction rub.  No murmur heard. Pulmonary/Chest: Effort normal and breath sounds normal. No respiratory distress. She has no wheezes. She has no rales.  Abdominal: Soft. Bowel sounds are normal. She exhibits no mass. There is no hepatosplenomegaly. There is no tenderness. There is no rebound and no guarding.  Musculoskeletal: Normal range of motion. She exhibits no edema or tenderness.  Lymphadenopathy:    She has no cervical adenopathy.  Neurological: She is alert and oriented to person, place, and time. She has normal strength. She  displays normal reflexes. No cranial nerve deficit.  Skin: Skin is warm. No rash noted.  Psychiatric: She has a normal mood and affect. Her mood appears not anxious. She does not exhibit a depressed mood.  Nursing note and vitals reviewed.     Assessment & Plan  Problem List Items Addressed This Visit    None    Visit Diagnoses    Enteritis    -  Primary   spoke to Amber, scheduled to see Dawson Bills on Tuesday 10th/ gave vials to collect fecal, stool culture, c-diff and OandP. Instructed to turn into lab   Relevant Orders   Ambulatory referral to Gastroenterology   Stool Culture   Calprotectin, Fecal   Ova and parasite examination      No orders of the defined types were placed in this encounter.     Dr. Macon [facility]  10/22/17

## 2017-10-26 ENCOUNTER — Other Ambulatory Visit
Admission: RE | Admit: 2017-10-26 | Discharge: 2017-10-26 | Disposition: A | Discharge status: Home / Self Care | Payer: Medicare PPO | Source: Ambulatory Visit | Attending: Family Medicine | Admitting: Family Medicine

## 2017-10-26 DIAGNOSIS — K529 Noninfective gastroenteritis and colitis, unspecified: Secondary | ICD-10-CM | POA: Insufficient documentation

## 2017-10-26 LAB — GASTROINTESTINAL PANEL BY PCR, STOOL (REPLACES STOOL CULTURE)
Adenovirus F40/41: NOT DETECTED
Astrovirus: NOT DETECTED
CRYPTOSPORIDIUM: NOT DETECTED
Campylobacter species: NOT DETECTED
Cyclospora cayetanensis: NOT DETECTED
ENTEROAGGREGATIVE E COLI (EAEC): NOT DETECTED
Entamoeba histolytica: NOT DETECTED
Enteropathogenic E coli (EPEC): NOT DETECTED
Enterotoxigenic E coli (ETEC): NOT DETECTED
GIARDIA LAMBLIA: NOT DETECTED
Norovirus GI/GII: NOT DETECTED
PLESIMONAS SHIGELLOIDES: NOT DETECTED
Rotavirus A: NOT DETECTED
SALMONELLA SPECIES: NOT DETECTED
SHIGELLA/ENTEROINVASIVE E COLI (EIEC): NOT DETECTED
Sapovirus (I, II, IV, and V): NOT DETECTED
Shiga like toxin producing E coli (STEC): NOT DETECTED
Vibrio cholerae: NOT DETECTED
Vibrio species: NOT DETECTED
YERSINIA ENTEROCOLITICA: NOT DETECTED

## 2017-10-26 LAB — C DIFFICILE QUICK SCREEN W PCR REFLEX
C DIFFICILE (CDIFF) INTERP: NOT DETECTED
C Diff antigen: NEGATIVE
C Diff toxin: NEGATIVE

## 2017-10-27 ENCOUNTER — Other Ambulatory Visit: Payer: Self-pay | Admitting: Nurse Practitioner

## 2017-10-27 ENCOUNTER — Ambulatory Visit
Admission: RE | Admit: 2017-10-27 | Discharge: 2017-10-27 | Disposition: A | Discharge status: Home / Self Care | Payer: Medicare PPO | Source: Ambulatory Visit | Attending: Nurse Practitioner | Admitting: Nurse Practitioner

## 2017-10-27 DIAGNOSIS — R1032 Left lower quadrant pain: Secondary | ICD-10-CM | POA: Diagnosis present

## 2017-10-27 DIAGNOSIS — K5732 Diverticulitis of large intestine without perforation or abscess without bleeding: Secondary | ICD-10-CM | POA: Diagnosis not present

## 2017-10-27 MED ORDER — IOPAMIDOL (ISOVUE-300) INJECTION 61%
100.0000 mL | Freq: Once | INTRAVENOUS | Status: AC | PRN
  Administered 2017-10-27: 100 mL via INTRAVENOUS

## 2017-10-28 LAB — CALPROTECTIN, FECAL: Calprotectin, Fecal: 712 ug/g — ABNORMAL HIGH (ref 0–120)

## 2017-11-16 ENCOUNTER — Other Ambulatory Visit
Admission: RE | Admit: 2017-11-16 | Discharge: 2017-11-16 | Disposition: A | Discharge status: Home / Self Care | Payer: Medicare PPO | Source: Ambulatory Visit | Attending: Nurse Practitioner | Admitting: Nurse Practitioner

## 2017-11-16 DIAGNOSIS — R197 Diarrhea, unspecified: Secondary | ICD-10-CM | POA: Insufficient documentation

## 2017-11-16 LAB — C DIFFICILE QUICK SCREEN W PCR REFLEX
C Diff antigen: NEGATIVE
C Diff interpretation: NOT DETECTED
C Diff toxin: NEGATIVE

## 2017-11-17 LAB — CALPROTECTIN, FECAL: Calprotectin, Fecal: <16 ug/g (ref 0–120)

## 2017-11-23 ENCOUNTER — Other Ambulatory Visit: Payer: Self-pay | Admitting: Family Medicine

## 2017-11-23 DIAGNOSIS — J441 Chronic obstructive pulmonary disease with (acute) exacerbation: Secondary | ICD-10-CM

## 2017-11-23 DIAGNOSIS — J219 Acute bronchiolitis, unspecified: Secondary | ICD-10-CM

## 2017-12-18 ENCOUNTER — Other Ambulatory Visit
Admission: RE | Admit: 2017-12-18 | Discharge: 2017-12-18 | Disposition: A | Discharge status: Home / Self Care | Payer: Medicare PPO | Source: Ambulatory Visit | Attending: Unknown Physician Specialty | Admitting: Unknown Physician Specialty

## 2017-12-18 DIAGNOSIS — R197 Diarrhea, unspecified: Secondary | ICD-10-CM | POA: Diagnosis present

## 2017-12-18 LAB — GASTROINTESTINAL PANEL BY PCR, STOOL (REPLACES STOOL CULTURE)
ASTROVIRUS: NOT DETECTED
Adenovirus F40/41: NOT DETECTED
CAMPYLOBACTER SPECIES: NOT DETECTED
CYCLOSPORA CAYETANENSIS: NOT DETECTED
Cryptosporidium: NOT DETECTED
ENTEROTOXIGENIC E COLI (ETEC): NOT DETECTED
Entamoeba histolytica: NOT DETECTED
Enteroaggregative E coli (EAEC): NOT DETECTED
Enteropathogenic E coli (EPEC): NOT DETECTED
Giardia lamblia: NOT DETECTED
Norovirus GI/GII: NOT DETECTED
PLESIMONAS SHIGELLOIDES: NOT DETECTED
Rotavirus A: NOT DETECTED
SAPOVIRUS (I, II, IV, AND V): NOT DETECTED
SHIGA LIKE TOXIN PRODUCING E COLI (STEC): NOT DETECTED
Salmonella species: NOT DETECTED
Shigella/Enteroinvasive E coli (EIEC): NOT DETECTED
VIBRIO SPECIES: NOT DETECTED
Vibrio cholerae: NOT DETECTED
Yersinia enterocolitica: NOT DETECTED

## 2017-12-18 LAB — C DIFFICILE QUICK SCREEN W PCR REFLEX
C DIFFICLE (CDIFF) ANTIGEN: NEGATIVE
C Diff interpretation: NOT DETECTED
C Diff toxin: NEGATIVE

## 2018-01-22 ENCOUNTER — Ambulatory Visit
Admission: EM | Admit: 2018-01-22 | Discharge: 2018-01-22 | Disposition: A | Discharge status: Home / Self Care | Payer: Medicare PPO | Attending: Family Medicine | Admitting: Family Medicine

## 2018-01-22 ENCOUNTER — Encounter: Payer: Self-pay | Admitting: Emergency Medicine

## 2018-01-22 ENCOUNTER — Other Ambulatory Visit: Payer: Self-pay

## 2018-01-22 DIAGNOSIS — J069 Acute upper respiratory infection, unspecified: Secondary | ICD-10-CM

## 2018-01-22 DIAGNOSIS — B9789 Other viral agents as the cause of diseases classified elsewhere: Secondary | ICD-10-CM | POA: Diagnosis not present

## 2018-01-22 DIAGNOSIS — J0110 Acute frontal sinusitis, unspecified: Secondary | ICD-10-CM

## 2018-01-22 DIAGNOSIS — R05 Cough: Secondary | ICD-10-CM | POA: Diagnosis not present

## 2018-01-22 MED ORDER — DOXYCYCLINE HYCLATE 100 MG PO CAPS: 100.0000 mg | ORAL_CAPSULE | Freq: Two times a day (BID) | ORAL | 0 refills | Status: DC

## 2018-01-22 MED ORDER — BENZONATATE 100 MG PO CAPS: 100.0000 mg | ORAL_CAPSULE | Freq: Three times a day (TID) | ORAL | 0 refills | Status: DC | PRN

## 2018-01-22 NOTE — ED Triage Notes (Signed)
Patient c/o sinus congestion and pressure since Monday.  Patient reports cough started yesterday.  Patient denies fevers.

## 2018-01-22 NOTE — Discharge Instructions (Signed)
Take medication as prescribed. Rest. Drink plenty of fluids.  ° °Follow up with your primary care physician this week as needed. Return to Urgent care for new or worsening concerns.  ° °

## 2018-01-22 NOTE — ED Provider Notes (Signed)
[facility] ____________________________________________  Time seen: Approximately 12:15 PM  I have reviewed the triage vital signs and the nursing notes.   HISTORY  Chief Complaint Sinus Problem and Cough   HPI Caroline Harris is a 72 y.o. female presenting for evaluation of 1 week of nasal congestion, nasal drainage and cough.  States having sinus pain to her forehead since middle of the week.  States getting up very thick greenish nasal drainage frequently.  Some scratchy throat discomfort.  Feels postnasal drainage.  Denies known fevers.  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  Overall continues to eat and drink well.  Did take some over-the-counter Mucinex and Tylenol without resolution.  Denies other aggravating alleviating factors.  States sinus pain is currently mild to moderate.  Denies chest pain or shortness of breath.  Denies recent antibiotic use.  Reports otherwise doing well denies other complaints.  Juline Patch, MD: PCP   Past Medical History:  Diagnosis Date  . Hyperlipidemia   . Restless leg     Patient Active Problem List   Diagnosis Date Noted  . Asthma, mild intermittent 06/07/2015  . Hyperlipidemia 06/07/2015  . Restless leg syndrome 08/10/2014    Past Surgical History:  Procedure Laterality Date  . ABDOMINAL HYSTERECTOMY    . COLONOSCOPY    . TONSILLECTOMY       No current facility-administered medications for this encounter.   Current Outpatient Medications:  .  aspirin 81 MG tablet, Take 81 mg by mouth daily., Disp: , Rfl:  .  calcium carbonate 1250 MG capsule, Take 1,250 mg by mouth 2 (two) times daily with a meal., Disp: , Rfl:  .  lovastatin (MEVACOR) 20 MG tablet, Take 1 tablet (20 mg total) by mouth daily., Disp: 90 tablet, Rfl: 3 .  montelukast (SINGULAIR) 10 MG tablet, TAKE 1 TABLET(10 MG) BY MOUTH DAILY, Disp: 90 tablet, Rfl: 0 .  Multiple Vitamins-Minerals (CENTRUM SILVER ADULT  50+ PO), Take 1 capsule by mouth daily at 6 (six) AM., Disp: , Rfl:  .  albuterol (PROVENTIL HFA;VENTOLIN HFA) 108 (90 Base) MCG/ACT inhaler, Inhale 2 puffs into the lungs every 6 (six) hours as needed for wheezing or shortness of breath. (Patient not taking: Reported on 06/22/2017), Disp: 1 Inhaler, Rfl: 11 .  benzonatate (TESSALON PERLES) 100 MG capsule, Take 1 capsule (100 mg total) by mouth 3 (three) times daily as needed for cough., Disp: 15 capsule, Rfl: 0 .  budesonide-formoterol (SYMBICORT) 160-4.5 MCG/ACT inhaler, Inhale 2 puffs into the lungs 2 (two) times daily., Disp: 1 Inhaler, Rfl: 11 .  doxycycline (VIBRAMYCIN) 100 MG capsule, Take 1 capsule (100 mg total) by mouth 2 (two) times daily., Disp: 20 capsule, Rfl: 0 .  rOPINIRole (REQUIP) 0.25 MG tablet, TAKE 1 TABLET(0.25 MG) BY MOUTH AT BEDTIME, Disp: 30 tablet, Rfl: 0  Allergies Patient has no known allergies.  Family History  Problem Relation Age of Onset  . Cancer Mother   . Heart attack Father     Social History Social History   Tobacco Use  . Smoking status: Current Some Day Smoker    Types: Cigarettes  . Smokeless tobacco: Never Used  Substance Use Topics  . Alcohol use: Yes    Alcohol/week: 1.0 standard drinks    Types: 1 Glasses of wine per week  . Drug use: No    Review of Systems Constitutional: No fever ENT: as above Cardiovascular: Denies chest pain. Respiratory: Denies shortness  of breath. Gastrointestinal: No abdominal pain.  Musculoskeletal: Negative for back pain. Skin: Negative for rash.   ____________________________________________   PHYSICAL EXAM:  VITAL SIGNS: ED Triage Vitals  Enc Vitals Group     BP 01/22/18 1048 118/74     Pulse Rate 01/22/18 1048 64     Resp 01/22/18 1048 14     Temp 01/22/18 1048 97.6 F (36.4 C)     Temp Source 01/22/18 1048 Oral     SpO2 01/22/18 1048 100 %     Weight 01/22/18 1044 148 lb (67.1 kg)     Height 01/22/18 1044 5\' 7"  (1.702 m)     Head  Circumference --      Peak Flow --      Pain Score 01/22/18 1044 5     Pain Loc --      Pain Edu? --      Excl. in [place]? --     Constitutional: Alert and oriented. Well appearing and in no acute distress. Eyes: Conjunctivae are normal.  Head: Atraumatic.Mild to moderate tenderness to palpation bilateral frontal sinuses.  No maxillary sinus tenderness palpation.  No swelling. No erythema.   Ears: no erythema, normal TMs bilaterally.   Nose: nasal congestion with bilateral nasal turbinate erythema and edema.   Mouth/Throat: Mucous membranes are moist.  Oropharynx non-erythematous.No tonsillar swelling or exudate.  Neck: No stridor.  No cervical spine tenderness to palpation. Hematological/Lymphatic/Immunilogical: No cervical lymphadenopathy. Cardiovascular: Normal rate, regular rhythm. Grossly normal heart sounds.  Good peripheral circulation. Respiratory: Normal respiratory effort.  No retractions.  No wheezes, rales or rhonchi. Good air movement.  Musculoskeletal: Steady gait. Neurologic:  Normal speech and language. No gait instability. Skin:  Skin is warm, dry and intact. No rash noted. Psychiatric: Mood and affect are normal. Speech and behavior are normal.  ___________________________________________   LABS (all labs ordered are listed, but only abnormal results are displayed)  Labs Reviewed - No data to display ____________________________________________   PROCEDURES Procedures    INITIAL IMPRESSION / ASSESSMENT AND PLAN / ED COURSE  Pertinent labs & imaging results that were available during my care of the patient were reviewed by me and considered in my medical decision making (see chart for details).  Overall well-appearing patient.  No acute distress.  Suspect recent viral illness, concern for secondary sinusitis.  Will treat with oral doxycycline and PRN Tessalon Perles.  Encourage rest, fluids, supportive care.Discussed indication, risks and benefits of medications  with patient.  Discussed follow up with Primary care physician this week. Discussed follow up and return parameters including no resolution or any worsening concerns. Patient verbalized understanding and agreed to plan.   ____________________________________________   FINAL CLINICAL IMPRESSION(S) / ED DIAGNOSES  Final diagnoses:  Acute frontal sinusitis, recurrence not specified  Viral URI with cough     ED Discharge Orders         Ordered    doxycycline (VIBRAMYCIN) 100 MG capsule  2 times daily     01/22/18 1142    benzonatate (TESSALON PERLES) 100 MG capsule  3 times daily PRN     01/22/18 1143           Note: This dictation was prepared with Dragon dictation along with smaller phrase technology. Any transcriptional errors that result from this process are unintentional.        Marylene Land, NP 01/22/18 1222

## 2018-01-28 ENCOUNTER — Other Ambulatory Visit: Payer: Self-pay | Admitting: Family Medicine

## 2018-01-28 DIAGNOSIS — E785 Hyperlipidemia, unspecified: Secondary | ICD-10-CM

## 2018-01-29 ENCOUNTER — Encounter: Payer: Self-pay | Admitting: Family Medicine

## 2018-01-29 ENCOUNTER — Ambulatory Visit (INDEPENDENT_AMBULATORY_CARE_PROVIDER_SITE_OTHER): Payer: Medicare PPO | Admitting: Family Medicine

## 2018-01-29 VITALS — BP 120/80 | HR 76 | Ht 67.0 in | Wt 145.0 lb

## 2018-01-29 DIAGNOSIS — F17200 Nicotine dependence, unspecified, uncomplicated: Secondary | ICD-10-CM | POA: Diagnosis not present

## 2018-01-29 DIAGNOSIS — J0100 Acute maxillary sinusitis, unspecified: Secondary | ICD-10-CM | POA: Diagnosis not present

## 2018-01-29 MED ORDER — AZITHROMYCIN 250 MG PO TABS: ORAL_TABLET | ORAL | 0 refills | Status: DC

## 2018-01-29 MED ORDER — GUAIFENESIN-CODEINE 100-10 MG/5ML PO SYRP: 5.0000 mL | ORAL_SOLUTION | Freq: Three times a day (TID) | ORAL | 0 refills | Status: DC | PRN

## 2018-01-29 NOTE — Progress Notes (Signed)
Date:  01/29/2018   Name:  Caroline Harris   DOB:  [birth year]   MRN:  [id number]   Chief Complaint: Follow-up (Dx viral URI and sinusitis- started on Doxy- has been on for 7 days. )  Sinusitis  This is a new problem. The current episode started in the past 7 days. The problem has been gradually improving since onset. There has been no fever. The fever has been present for 3 to 4 days. Her pain is at a severity of 3/10. The pain is moderate. Pertinent negatives include no chills, congestion, coughing, diaphoresis, ear pain, headaches, hoarse voice, neck pain, shortness of breath, sinus pressure, sneezing, sore throat or swollen glands. Past treatments include acetaminophen. The treatment provided moderate relief.    Review of Systems  Constitutional: Negative.  Negative for chills, diaphoresis, fatigue, fever and unexpected weight change.  HENT: Negative for congestion, ear discharge, ear pain, hoarse voice, rhinorrhea, sinus pressure, sneezing and sore throat.   Eyes: Negative for photophobia, pain, discharge, redness and itching.  Respiratory: Negative for cough, shortness of breath, wheezing and stridor.   Gastrointestinal: Negative for abdominal pain, blood in stool, constipation, diarrhea, nausea and vomiting.  Endocrine: Negative for cold intolerance, heat intolerance, polydipsia, polyphagia and polyuria.  Genitourinary: Negative for dysuria, flank pain, frequency, hematuria, menstrual problem, pelvic pain, urgency, vaginal bleeding and vaginal discharge.  Musculoskeletal: Negative for arthralgias, back pain, myalgias and neck pain.  Skin: Negative for rash.  Allergic/Immunologic: Negative for environmental allergies and food allergies.  Neurological: Negative for dizziness, weakness, light-headedness, numbness and headaches.  Hematological: Negative for adenopathy. Does not bruise/bleed easily.  Psychiatric/Behavioral: Negative for dysphoric mood. The patient is not nervous/anxious.       Patient Active Problem List   Diagnosis Date Noted  . Asthma, mild intermittent 06/07/2015  . Hyperlipidemia 06/07/2015  . Restless leg syndrome 08/10/2014    No Known Allergies  Past Surgical History:  Procedure Laterality Date  . ABDOMINAL HYSTERECTOMY    . COLONOSCOPY    . TONSILLECTOMY      Social History   Tobacco Use  . Smoking status: Current Some Day Smoker    Types: Cigarettes  . Smokeless tobacco: Never Used  Substance Use Topics  . Alcohol use: Yes    Alcohol/week: 1.0 standard drinks    Types: 1 Glasses of wine per week  . Drug use: No     Medication list has been reviewed and updated.  Current Meds  Medication Sig  . albuterol (PROVENTIL HFA;VENTOLIN HFA) 108 (90 Base) MCG/ACT inhaler Inhale 2 puffs into the lungs every 6 (six) hours as needed for wheezing or shortness of breath.  Marland Kitchen aspirin 81 MG tablet Take 81 mg by mouth daily.  . budesonide (ENTOCORT EC) 3 MG 24 hr capsule Dawson Bills  . calcium carbonate 1250 MG capsule Take 1,250 mg by mouth 2 (two) times daily with a meal.  . doxycycline (VIBRAMYCIN) 100 MG capsule Take 1 capsule (100 mg total) by mouth 2 (two) times daily.  Marland Kitchen lovastatin (MEVACOR) 20 MG tablet TAKE 1 TABLET BY MOUTH DAILY  . montelukast (SINGULAIR) 10 MG tablet TAKE 1 TABLET(10 MG) BY MOUTH DAILY  . Multiple Vitamins-Minerals (CENTRUM SILVER ADULT 50+ PO) Take 1 capsule by mouth daily at 6 (six) AM.    PHQ 2/9 Scores 04/14/2017 08/13/2015 08/10/2014  PHQ - 2 Score 0 0 0  PHQ- 9 Score 1 - -    Physical Exam Vitals signs and nursing note reviewed.  Constitutional:      General: She is not in acute distress.    Appearance: She is not diaphoretic.  HENT:     Head: Normocephalic and atraumatic.     Right Ear: External ear normal.     Left Ear: External ear normal.     Nose: Nose normal.  Eyes:     General:        Right eye: No discharge.        Left eye: No discharge.     Conjunctiva/sclera: Conjunctivae normal.      Pupils: Pupils are equal, round, and reactive to light.  Neck:     Musculoskeletal: Normal range of motion and neck supple.     Thyroid: No thyromegaly.     Vascular: No JVD.  Cardiovascular:     Rate and Rhythm: Normal rate and regular rhythm.     Heart sounds: Normal heart sounds. No murmur. No friction rub. No gallop.   Pulmonary:     Effort: Pulmonary effort is normal.     Breath sounds: Normal breath sounds. No wheezing, rhonchi or rales.  Abdominal:     General: Bowel sounds are normal.     Palpations: Abdomen is soft. There is no mass.     Tenderness: There is no abdominal tenderness. There is no guarding.  Musculoskeletal: Normal range of motion.  Lymphadenopathy:     Cervical: No cervical adenopathy.  Skin:    General: Skin is warm and dry.  Neurological:     Mental Status: She is alert.     Deep Tendon Reflexes: Reflexes are normal and symmetric.     BP 120/80   Pulse 76   Ht 5\' 7"  (1.702 m)   Wt 145 lb (65.8 kg)   BMI 22.71 kg/m   Assessment and Plan:  1. Tobacco dependence Discussed tobacco cessation  2. Acute maxillary sinusitis, recurrence not specified Acute- start azithromycin and Rob AC for cough - azithromycin (ZITHROMAX) 250 MG tablet; 2 today then 1 a day for 4 days  Dispense: 6 each; Refill: 0 - guaiFENesin-codeine (ROBITUSSIN AC) 100-10 MG/5ML syrup; Take 5 mLs by mouth 3 (three) times daily as needed for cough.  Dispense: 100 mL; Refill: 0

## 2018-02-08 ENCOUNTER — Ambulatory Visit: Payer: Self-pay | Admitting: Family Medicine

## 2018-02-22 ENCOUNTER — Ambulatory Visit (INDEPENDENT_AMBULATORY_CARE_PROVIDER_SITE_OTHER): Payer: Medicare Other | Admitting: Family Medicine

## 2018-02-22 ENCOUNTER — Encounter: Payer: Self-pay | Admitting: Family Medicine

## 2018-02-22 VITALS — BP 110/70 | HR 80 | Ht 67.0 in | Wt 143.0 lb

## 2018-02-22 DIAGNOSIS — E7800 Pure hypercholesterolemia, unspecified: Secondary | ICD-10-CM | POA: Diagnosis not present

## 2018-02-22 DIAGNOSIS — R69 Illness, unspecified: Secondary | ICD-10-CM

## 2018-02-22 DIAGNOSIS — J4520 Mild intermittent asthma, uncomplicated: Secondary | ICD-10-CM | POA: Diagnosis not present

## 2018-02-22 DIAGNOSIS — F17200 Nicotine dependence, unspecified, uncomplicated: Secondary | ICD-10-CM

## 2018-02-22 DIAGNOSIS — J441 Chronic obstructive pulmonary disease with (acute) exacerbation: Secondary | ICD-10-CM | POA: Diagnosis not present

## 2018-02-22 MED ORDER — MONTELUKAST SODIUM 10 MG PO TABS: ORAL_TABLET | ORAL | 3 refills | Status: DC

## 2018-02-22 NOTE — Progress Notes (Signed)
Date:  02/22/2018   Name:  Caroline Harris   DOB:  [birth year]   MRN:  [id number]   Chief Complaint: COPD; Asthma; and Hyperlipidemia  COPD  There is no chest tightness, cough, difficulty breathing, frequent throat clearing, hemoptysis, hoarse voice, shortness of breath, sputum production or wheezing. This is a chronic problem. The current episode started more than 1 year ago. The problem occurs intermittently. The problem has been unchanged (stable). Pertinent negatives include no appetite change, chest pain, dyspnea on exertion, ear congestion, ear pain, fever, headaches, heartburn, malaise/fatigue, myalgias, nasal congestion, orthopnea, PND, postnasal drip, rhinorrhea, sneezing, sore throat, sweats, trouble swallowing or weight loss. Her symptoms are aggravated by nothing. Her symptoms are alleviated by leukotriene antagonist (not taking symbicort/albuterol). She reports moderate improvement on treatment. Her past medical history is significant for asthma and COPD.  Asthma  There is no chest tightness, cough, difficulty breathing, frequent throat clearing, hemoptysis, hoarse voice, shortness of breath, sputum production or wheezing. This is a chronic problem. The current episode started more than 1 year ago. The problem occurs intermittently. The problem has been waxing and waning. Pertinent negatives include no appetite change, chest pain, dyspnea on exertion, ear congestion, ear pain, fever, headaches, heartburn, malaise/fatigue, myalgias, nasal congestion, orthopnea, PND, postnasal drip, rhinorrhea, sneezing, sore throat, sweats, trouble swallowing or weight loss. She reports moderate improvement on treatment. Her past medical history is significant for asthma and COPD.  Hyperlipidemia  This is a chronic problem. The current episode started more than 1 year ago. The problem is controlled. Recent lipid tests were reviewed and are normal. She has no history of chronic renal disease, diabetes,  hypothyroidism, liver disease, obesity or nephrotic syndrome. There are no known factors aggravating her hyperlipidemia. Pertinent negatives include no chest pain, focal sensory loss, focal weakness, leg pain, myalgias or shortness of breath. Current antihyperlipidemic treatment includes statins. The current treatment provides moderate improvement of lipids. There are no compliance problems.     Review of Systems  Constitutional: Negative.  Negative for appetite change, chills, fatigue, fever, malaise/fatigue, unexpected weight change and weight loss.  HENT: Negative for congestion, ear discharge, ear pain, hoarse voice, postnasal drip, rhinorrhea, sinus pressure, sneezing, sore throat and trouble swallowing.   Eyes: Negative for photophobia, pain, discharge, redness and itching.  Respiratory: Negative for cough, hemoptysis, sputum production, shortness of breath, wheezing and stridor.   Cardiovascular: Negative for chest pain, dyspnea on exertion and PND.  Gastrointestinal: Negative for abdominal pain, blood in stool, constipation, diarrhea, heartburn, nausea and vomiting.  Endocrine: Negative for cold intolerance, heat intolerance, polydipsia, polyphagia and polyuria.  Genitourinary: Negative for dysuria, flank pain, frequency, hematuria, menstrual problem, pelvic pain, urgency, vaginal bleeding and vaginal discharge.  Musculoskeletal: Negative for arthralgias, back pain and myalgias.  Skin: Negative for rash.  Allergic/Immunologic: Negative for environmental allergies and food allergies.  Neurological: Negative for dizziness, focal weakness, weakness, light-headedness, numbness and headaches.  Hematological: Negative for adenopathy. Does not bruise/bleed easily.  Psychiatric/Behavioral: Negative for dysphoric mood. The patient is not nervous/anxious.     Patient Active Problem List   Diagnosis Date Noted  . Asthma, mild intermittent 06/07/2015  . Hyperlipidemia 06/07/2015  . Restless leg  syndrome 08/10/2014    No Known Allergies  Past Surgical History:  Procedure Laterality Date  . ABDOMINAL HYSTERECTOMY    . COLONOSCOPY    . TONSILLECTOMY      Social History   Tobacco Use  . Smoking status: Current Some Day Smoker  Types: Cigarettes  . Smokeless tobacco: Never Used  Substance Use Topics  . Alcohol use: Yes    Alcohol/week: 1.0 standard drinks    Types: 1 Glasses of wine per week  . Drug use: No     Medication list has been reviewed and updated.  Current Meds  Medication Sig  . albuterol (PROVENTIL HFA;VENTOLIN HFA) 108 (90 Base) MCG/ACT inhaler Inhale 2 puffs into the lungs every 6 (six) hours as needed for wheezing or shortness of breath.  Marland Kitchen aspirin 81 MG tablet Take 81 mg by mouth daily.  . budesonide (ENTOCORT EC) 3 MG 24 hr capsule Dawson Bills  . calcium carbonate 1250 MG capsule Take 1,250 mg by mouth 2 (two) times daily with a meal.  . lovastatin (MEVACOR) 20 MG tablet TAKE 1 TABLET BY MOUTH DAILY  . montelukast (SINGULAIR) 10 MG tablet TAKE 1 TABLET(10 MG) BY MOUTH DAILY  . Multiple Vitamins-Minerals (CENTRUM SILVER ADULT 50+ PO) Take 1 capsule by mouth daily at 6 (six) AM.    PHQ 2/9 Scores 04/14/2017 08/13/2015 08/10/2014  PHQ - 2 Score 0 0 0  PHQ- 9 Score 1 - -    Physical Exam  BP 110/70   Pulse 80   Ht 5\' 7"  (1.702 m)   Wt 143 lb (64.9 kg)   BMI 22.40 kg/m   Assessment and Plan: 1. Tobacco dependence Patient has been advised of the health risks of smoking and counseled concerning cessation of tobacco products. I spent over 3 minutes for discussion and to answer questions.  2. Chronic obstructive pulmonary disease with acute exacerbation (HCC) Chronic.  Controlled.  Patient presently not on Symbicort and albuterol will take as needed.  - Hepatic function panel  3. Pure hypercholesterolemia Chronic.  Stable.  Will check lipid panel with LDL HDL ratio - Lipid Panel With LDL/HDL Ratio  4. Taking medication for chronic  disease Patient on statin.  Patient will have hepatic function panel checked. - Hepatic function panel  5. Mild intermittent asthma, unspecified whether complicated Asthma stable we will continue Singulair 10 mg once a day as needed - montelukast (SINGULAIR) 10 MG tablet; TAKE 1 TABLET(10 MG) BY MOUTH DAILY  Dispense: 90 tablet; Refill: 3

## 2018-02-23 LAB — LIPID PANEL WITH LDL/HDL RATIO
Cholesterol, Total: 202 mg/dL — ABNORMAL HIGH (ref 100–199)
HDL: 104 mg/dL (ref >39)
LDL Calculated: 78 mg/dL (ref 0–99)
LDl/HDL Ratio: 0.8 ratio (ref 0.0–3.2)
Triglycerides: 101 mg/dL (ref 0–149)
VLDL Cholesterol Cal: 20 mg/dL (ref 5–40)

## 2018-02-23 LAB — HEPATIC FUNCTION PANEL
ALK PHOS: 37 IU/L — AB (ref 39–117)
ALT: 17 IU/L (ref 0–32)
AST: 19 IU/L (ref 0–40)
Albumin: 4.5 g/dL (ref 3.5–4.8)
Bilirubin Total: 0.6 mg/dL (ref 0.0–1.2)
Bilirubin, Direct: 0.15 mg/dL (ref 0.00–0.40)
Total Protein: 6.8 g/dL (ref 6.0–8.5)

## 2018-03-02 ENCOUNTER — Ambulatory Visit (INDEPENDENT_AMBULATORY_CARE_PROVIDER_SITE_OTHER): Payer: Medicare Other | Admitting: Family Medicine

## 2018-03-02 ENCOUNTER — Encounter: Payer: Self-pay | Admitting: Family Medicine

## 2018-03-02 VITALS — BP 124/78 | HR 80 | Ht 67.0 in | Wt 147.0 lb

## 2018-03-02 DIAGNOSIS — N644 Mastodynia: Secondary | ICD-10-CM

## 2018-03-02 NOTE — Progress Notes (Signed)
Date:  03/02/2018   Name:  Caroline Harris   DOB:  29-May-1945   MRN:  [id number]   Chief Complaint: Breast Pain (pain in r) breast)  Right breast pain Cameron Ali Friday/ not constant/ no fever/no nipple discharge/ described as tearing feeling/ nontender/ nonradiating/ no aggravating circumstances/ duration seconds/ frequency varies/ noted at night and in morning/ no alleviating circumstances/ maternal history of breast cancer.   Review of Systems  Constitutional: Negative.  Negative for chills, fatigue, fever and unexpected weight change.  HENT: Negative for congestion, ear discharge, ear pain, rhinorrhea, sinus pressure, sneezing and sore throat.   Eyes: Negative for photophobia, pain, discharge, redness and itching.  Respiratory: Negative for cough, shortness of breath, wheezing and stridor.   Gastrointestinal: Negative for abdominal pain, blood in stool, constipation, diarrhea, nausea and vomiting.  Endocrine: Negative for cold intolerance, heat intolerance, polydipsia, polyphagia and polyuria.  Genitourinary: Negative for dysuria, flank pain, frequency, hematuria, menstrual problem, pelvic pain, urgency, vaginal bleeding and vaginal discharge.  Musculoskeletal: Negative for arthralgias, back pain and myalgias.  Skin: Negative for rash.  Allergic/Immunologic: Negative for environmental allergies and food allergies.  Neurological: Negative for dizziness, weakness, light-headedness, numbness and headaches.  Hematological: Negative for adenopathy. Does not bruise/bleed easily.  Psychiatric/Behavioral: Negative for dysphoric mood. The patient is not nervous/anxious.     Patient Active Problem List   Diagnosis Date Noted  . Asthma, mild intermittent 06/07/2015  . Hyperlipidemia 06/07/2015  . Restless leg syndrome 08/10/2014    No Known Allergies  Past Surgical History:  Procedure Laterality Date  . ABDOMINAL HYSTERECTOMY    . COLONOSCOPY    . TONSILLECTOMY      Social History    Tobacco Use  . Smoking status: Current Some Day Smoker    Types: Cigarettes  . Smokeless tobacco: Never Used  Substance Use Topics  . Alcohol use: Yes    Alcohol/week: 1.0 standard drinks    Types: 1 Glasses of wine per week  . Drug use: No     Medication list has been reviewed and updated.  Current Meds  Medication Sig  . albuterol (PROVENTIL HFA;VENTOLIN HFA) 108 (90 Base) MCG/ACT inhaler Inhale 2 puffs into the lungs every 6 (six) hours as needed for wheezing or shortness of breath.  Marland Kitchen aspirin 81 MG tablet Take 81 mg by mouth daily.  . budesonide (ENTOCORT EC) 3 MG 24 hr capsule Dawson Bills  . budesonide-formoterol (SYMBICORT) 160-4.5 MCG/ACT inhaler Inhale 2 puffs into the lungs 2 (two) times daily.  . calcium carbonate 1250 MG capsule Take 1,250 mg by mouth 2 (two) times daily with a meal.  . lovastatin (MEVACOR) 20 MG tablet TAKE 1 TABLET BY MOUTH DAILY  . montelukast (SINGULAIR) 10 MG tablet TAKE 1 TABLET(10 MG) BY MOUTH DAILY  . Multiple Vitamins-Minerals (CENTRUM SILVER ADULT 50+ PO) Take 1 capsule by mouth daily at 6 (six) AM.    PHQ 2/9 Scores 04/14/2017 08/13/2015 08/10/2014  PHQ - 2 Score 0 0 0  PHQ- 9 Score 1 - -    Physical Exam Vitals signs and nursing note reviewed.  Constitutional:      General: She is not in acute distress.    Appearance: She is not diaphoretic.  HENT:     Head: Normocephalic and atraumatic.     Right Ear: External ear normal.     Left Ear: External ear normal.     Nose: Nose normal.  Eyes:     General:  Right eye: No discharge.        Left eye: No discharge.     Conjunctiva/sclera: Conjunctivae normal.     Pupils: Pupils are equal, round, and reactive to light.  Neck:     Musculoskeletal: Normal range of motion and neck supple. No neck rigidity or muscular tenderness.     Thyroid: No thyromegaly.     Vascular: No carotid bruit or JVD.  Cardiovascular:     Rate and Rhythm: Normal rate and regular rhythm.     Heart sounds:  Normal heart sounds. No murmur. No friction rub. No gallop.   Pulmonary:     Effort: Pulmonary effort is normal.     Breath sounds: Normal breath sounds.  Chest:     Chest wall: No mass, deformity, swelling or tenderness. There is no dullness to percussion.     Breasts: Breasts are symmetrical.        Right: No swelling, bleeding, inverted nipple, mass, nipple discharge, skin change or tenderness.        Left: No swelling, bleeding, inverted nipple, mass, nipple discharge, skin change or tenderness.  Abdominal:     General: Bowel sounds are normal.     Palpations: Abdomen is soft. There is no mass.     Tenderness: There is no abdominal tenderness. There is no guarding.  Musculoskeletal: Normal range of motion.  Lymphadenopathy:     Cervical: No cervical adenopathy.     Upper Body:     Right upper body: No supraclavicular, axillary or pectoral adenopathy.     Left upper body: No supraclavicular, axillary or pectoral adenopathy.  Skin:    General: Skin is warm and dry.  Neurological:     Mental Status: She is alert.     Deep Tendon Reflexes: Reflexes are normal and symmetric.     BP 124/78   Pulse 80   Ht 5\' 7"  (1.702 m)   Wt 147 lb (66.7 kg)   BMI 23.02 kg/m   Assessment and Plan:  1. Mastalgia in female Onset of right lateral wall/breast pain at about the 10:00 area.  This is been episodic since seconds sharp in nature.  Palpable breast mass was noted the patient has a positive family history for breast cancer.  Will undergo diagnostic mammogram of the right breast with the possibility of ultrasound and proceed with screening of the left.

## 2018-03-22 ENCOUNTER — Telehealth: Payer: Self-pay | Admitting: Family Medicine

## 2018-03-22 NOTE — Telephone Encounter (Signed)
Spoke with patient and schedules AWV-s for 04/05/2018 at 11:20 AM.  lec

## 2018-03-22 NOTE — Telephone Encounter (Signed)
Called to schedule Medicare Annual Wellness Visit with the Nurse Health Advisor. No answer on Home and Mobile number.  Left message on both.  If patient returns call, please note: their last AWV was on 03/21/2011, please schedule AWV-s with [facility].  Patient is due NOW.  Thank you! For any questions please contact: Janace Hoard at [phone] or Skype [email or web address]

## 2018-04-05 ENCOUNTER — Ambulatory Visit (INDEPENDENT_AMBULATORY_CARE_PROVIDER_SITE_OTHER): Payer: Medicare Other

## 2018-04-05 VITALS — BP 120/80 | HR 54 | Temp 97.8°F | Resp 16 | Ht 67.0 in | Wt 147.2 lb

## 2018-04-05 DIAGNOSIS — Z0000 Encounter for general adult medical examination without abnormal findings: Secondary | ICD-10-CM

## 2018-04-05 DIAGNOSIS — Z780 Asymptomatic menopausal state: Secondary | ICD-10-CM

## 2018-04-05 NOTE — Patient Instructions (Signed)
Caroline Harris , Thank you for taking time to come for your Medicare Wellness Visit. I appreciate your ongoing commitment to your health goals. Please review the following plan we discussed and let me know if I can assist you in the future.   Screening recommendations/referrals: Colonoscopy: done 07/22/16. Repeat in 2023.  Mammogram: done 03/04/18 Bone Density: done 06/10/12. Please call [phone] to schedule your bone density screening.  Recommended yearly ophthalmology/optometry visit for glaucoma screening and checkup Recommended yearly dental visit for hygiene and checkup  Vaccinations: Influenza vaccine: done 11/25/17 Pneumococcal vaccine: done 08/13/15 Tdap vaccine: done 10/30/11 Shingles vaccine: Shinrix series completed 2019.  Advanced directives: Advance directive discussed with you today. I have provided a copy for you to complete at home and have notarized. Once this is complete please bring a copy in to our office so we can scan it into your chart.  Conditions/risks identified: If you wish to quit smoking, help is available. For free tobacco cessation program offerings call the [facility] at 5014559667 or Live Well [facility] at ([phone])408-4666. You may also visit [email or web address] or email [email or web address] for more information on other programs.    Next appointment: Please follow up in one year for your Medicare Annual Wellness visit.     Preventive Care 74 Years and Older, Female Preventive care refers to lifestyle choices and visits with your health care provider that can promote health and wellness. What does preventive care include?  A yearly physical exam. This is also called an annual well check.  Dental exams once or twice a year.  Routine eye exams. Ask your health care provider how often you should have your eyes checked.  Personal lifestyle choices, including:  Daily care of your teeth and gums.  Regular physical activity.  Eating a  healthy diet.  Avoiding tobacco and drug use.  Limiting alcohol use.  Practicing safe sex.  Taking low-dose aspirin every day.  Taking vitamin and mineral supplements as recommended by your health care provider. What happens during an annual well check? The services and screenings done by your health care provider during your annual well check will depend on your age, overall health, lifestyle risk factors, and family history of disease. Counseling  Your health care provider may ask you questions about your:  Alcohol use.  Tobacco use.  Drug use.  Emotional well-being.  Home and relationship well-being.  Sexual activity.  Eating habits.  History of falls.  Memory and ability to understand (cognition).  Work and work Statistician.  Reproductive health. Screening  You may have the following tests or measurements:  Height, weight, and BMI.  Blood pressure.  Lipid and cholesterol levels. These may be checked every 5 years, or more frequently if you are over 41 years old.  Skin check.  Lung cancer screening. You may have this screening every year starting at age 63 if you have a 30-pack-year history of smoking and currently smoke or have quit within the past 15 years.  Fecal occult blood test (FOBT) of the stool. You may have this test every year starting at age 74.  Flexible sigmoidoscopy or colonoscopy. You may have a sigmoidoscopy every 5 years or a colonoscopy every 10 years starting at age 74.  Hepatitis C blood test.  Hepatitis B blood test.  Sexually transmitted disease (STD) testing.  Diabetes screening. This is done by checking your blood sugar (glucose) after you have not eaten for a while (fasting). You may have this done every 1-3  years.  Bone density scan. This is done to screen for osteoporosis. You may have this done starting at age 74.  Mammogram. This may be done every 1-2 years. Talk to your health care provider about how often you should  have regular mammograms. Talk with your health care provider about your test results, treatment options, and if necessary, the need for more tests. Vaccines  Your health care provider may recommend certain vaccines, such as:  Influenza vaccine. This is recommended every year.  Tetanus, diphtheria, and acellular pertussis (Tdap, Td) vaccine. You may need a Td booster every 10 years.  Zoster vaccine. You may need this after age 74.  Pneumococcal 13-valent conjugate (PCV13) vaccine. One dose is recommended after age 74.  Pneumococcal polysaccharide (PPSV23) vaccine. One dose is recommended after age 74. Talk to your health care provider about which screenings and vaccines you need and how often you need them. This information is not intended to replace advice given to you by your health care provider. Make sure you discuss any questions you have with your health care provider. Document Released: 03/02/2015 Document Revised: 10/24/2015 Document Reviewed: 12/05/2014 Elsevier Interactive Patient Education  2017 [place] Prevention in the Home Falls can cause injuries. They can happen to people of all ages. There are many things you can do to make your home safe and to help prevent falls. What can I do on the outside of my home?  Regularly fix the edges of walkways and driveways and fix any cracks.  Remove anything that might make you trip as you walk through a door, such as a raised step or threshold.  Trim any bushes or trees on the path to your home.  Use bright outdoor lighting.  Clear any walking paths of anything that might make someone trip, such as rocks or tools.  Regularly check to see if handrails are loose or broken. Make sure that both sides of any steps have handrails.  Any raised decks and porches should have guardrails on the edges.  Have any leaves, snow, or ice cleared regularly.  Use sand or salt on walking paths during winter.  Clean up any spills in  your garage right away. This includes oil or grease spills. What can I do in the bathroom?  Use night lights.  Install grab bars by the toilet and in the tub and shower. Do not use towel bars as grab bars.  Use non-skid mats or decals in the tub or shower.  If you need to sit down in the shower, use a plastic, non-slip stool.  Keep the floor dry. Clean up any water that spills on the floor as soon as it happens.  Remove soap buildup in the tub or shower regularly.  Attach bath mats securely with double-sided non-slip rug tape.  Do not have throw rugs and other things on the floor that can make you trip. What can I do in the bedroom?  Use night lights.  Make sure that you have a light by your bed that is easy to reach.  Do not use any sheets or blankets that are too big for your bed. They should not hang down onto the floor.  Have a firm chair that has side arms. You can use this for support while you get dressed.  Do not have throw rugs and other things on the floor that can make you trip. What can I do in the kitchen?  Clean up any spills right away.  Avoid walking on wet floors.  Keep items that you use a lot in easy-to-reach places.  If you need to reach something above you, use a strong step stool that has a grab bar.  Keep electrical cords out of the way.  Do not use floor polish or wax that makes floors slippery. If you must use wax, use non-skid floor wax.  Do not have throw rugs and other things on the floor that can make you trip. What can I do with my stairs?  Do not leave any items on the stairs.  Make sure that there are handrails on both sides of the stairs and use them. Fix handrails that are broken or loose. Make sure that handrails are as long as the stairways.  Check any carpeting to make sure that it is firmly attached to the stairs. Fix any carpet that is loose or worn.  Avoid having throw rugs at the top or bottom of the stairs. If you do have  throw rugs, attach them to the floor with carpet tape.  Make sure that you have a light switch at the top of the stairs and the bottom of the stairs. If you do not have them, ask someone to add them for you. What else can I do to help prevent falls?  Wear shoes that:  Do not have high heels.  Have rubber bottoms.  Are comfortable and fit you well.  Are closed at the toe. Do not wear sandals.  If you use a stepladder:  Make sure that it is fully opened. Do not climb a closed stepladder.  Make sure that both sides of the stepladder are locked into place.  Ask someone to hold it for you, if possible.  Clearly mark and make sure that you can see:  Any grab bars or handrails.  First and last steps.  Where the edge of each step is.  Use tools that help you move around (mobility aids) if they are needed. These include:  Canes.  Walkers.  Scooters.  Crutches.  Turn on the lights when you go into a dark area. Replace any light bulbs as soon as they burn out.  Set up your furniture so you have a clear path. Avoid moving your furniture around.  If any of your floors are uneven, fix them.  If there are any pets around you, be aware of where they are.  Review your medicines with your doctor. Some medicines can make you feel dizzy. This can increase your chance of falling. Ask your doctor what other things that you can do to help prevent falls. This information is not intended to replace advice given to you by your health care provider. Make sure you discuss any questions you have with your health care provider. Document Released: 11/30/2008 Document Revised: 07/12/2015 Document Reviewed: 03/10/2014 Elsevier Interactive Patient Education  2017 [employer].

## 2018-04-05 NOTE — Progress Notes (Signed)
Subjective:   Caroline Harris is a 73 y.o. female who presents for Medicare Annual (Subsequent) preventive examination.  Review of Systems:   Cardiac Risk Factors include: advanced age (>30men, >32 women);dyslipidemia     Objective:     Vitals: BP 120/80 (BP Location: Left Arm, Patient Position: Sitting, Cuff Size: Normal)   Pulse (!) 54   Temp 97.8 F (36.6 C) (Oral)   Resp 16   Ht 5\' 7"  (1.702 m)   Wt 147 lb 3.2 oz (66.8 kg)   SpO2 98%   BMI 23.05 kg/m   Body mass index is 23.05 kg/m.  Advanced Directives 04/05/2018 01/22/2018 08/10/2014  Does Patient Have a Medical Advance Directive? No No No  Would patient like information on creating a medical advance directive? Yes (MAU/Ambulatory/Procedural Areas - Information given) - No - patient declined information    Tobacco Social History   Tobacco Use  Smoking Status Current Some Day Smoker  . Packs/day: 0.50  . Years: 50.00  . Pack years: 25.00  . Types: Cigarettes  Smokeless Tobacco Never Used     Ready to quit: Yes Counseling given: Yes   Clinical Intake:  Pre-visit preparation completed: Yes  Pain : No/denies pain     BMI - recorded: 23.05 Nutritional Status: BMI of 19-24  Normal Nutritional Risks: None Diabetes: No  How often do you need to have someone help you when you read instructions, pamphlets, or other written materials from your doctor or pharmacy?: 1 - Never  Interpreter Needed?: No  Information entered by :: Clemetine Marker LPN  Past Medical History:  Diagnosis Date  . Diverticulitis   . Hyperlipidemia   . Restless leg    Past Surgical History:  Procedure Laterality Date  . ABDOMINAL HYSTERECTOMY    . COLONOSCOPY    . TONSILLECTOMY     Family History  Problem Relation Age of Onset  . Breast cancer Mother 81  . Heart attack Father    Social History   Socioeconomic History  . Marital status: Married    Spouse name: Not on file  . Number of children: 3  . Years of education: Not  on file  . Highest education level: Bachelor's degree (e.g., BA, AB, BS)  Occupational History    Comment: part time music teacher at [employer]  . Financial resource strain: Not hard at all  . Food insecurity:    Worry: Never true    Inability: Never true  . Transportation needs:    Medical: No    Non-medical: No  Tobacco Use  . Smoking status: Current Some Day Smoker    Packs/day: 0.50    Years: 50.00    Pack years: 25.00    Types: Cigarettes  . Smokeless tobacco: Never Used  Substance and Sexual Activity  . Alcohol use: Yes    Alcohol/week: 1.0 standard drinks    Types: 1 Glasses of wine per week  . Drug use: No  . Sexual activity: Not Currently  Lifestyle  . Physical activity:    Days per week: 2 days    Minutes per session: 30 min  . Stress: Not at all  Relationships  . Social connections:    Talks on phone: More than three times a week    Gets together: Three times a week    Attends religious service: More than 4 times per year    Active member of club or organization: No    Attends meetings  of clubs or organizations: Never    Relationship status: Married  Other Topics Concern  . Not on file  Social History Narrative  . Not on file    Outpatient Encounter Medications as of 04/05/2018  Medication Sig  . albuterol (PROVENTIL HFA;VENTOLIN HFA) 108 (90 Base) MCG/ACT inhaler Inhale 2 puffs into the lungs every 6 (six) hours as needed for wheezing or shortness of breath.  Marland Kitchen aspirin 81 MG tablet Take 81 mg by mouth daily.  . budesonide-formoterol (SYMBICORT) 160-4.5 MCG/ACT inhaler Inhale 2 puffs into the lungs 2 (two) times daily.  . calcium carbonate 1250 MG capsule Take 1,250 mg by mouth 2 (two) times daily with a meal.  . lovastatin (MEVACOR) 20 MG tablet TAKE 1 TABLET BY MOUTH DAILY  . Melatonin 10 MG TABS Take 1 tablet by mouth at bedtime.  . montelukast (SINGULAIR) 10 MG tablet TAKE 1 TABLET(10 MG) BY MOUTH DAILY  . Multiple  Vitamins-Minerals (CENTRUM SILVER ADULT 50+ PO) Take 1 capsule by mouth daily at 6 (six) AM.  . rOPINIRole (REQUIP) 0.25 MG tablet TAKE 1 TABLET(0.25 MG) BY MOUTH AT BEDTIME (Patient taking differently: Pt takes very rarely)  . [DISCONTINUED] budesonide (ENTOCORT EC) 3 MG 24 hr capsule Dawson Bills   No facility-administered encounter medications on file as of 04/05/2018.     Activities of Daily Living In your present state of health, do you have any difficulty performing the following activities: 04/05/2018  Hearing? N  Comment declines hearing aids  Vision? Y  Comment floaters in vision, wears reading glasses  Difficulty concentrating or making decisions? N  Walking or climbing stairs? N  Dressing or bathing? N  Doing errands, shopping? N  Preparing Food and eating ? N  Using the Toilet? N  In the past six months, have you accidently leaked urine? N  Do you have problems with loss of bowel control? N  Managing your Medications? N  Managing your Finances? N  Housekeeping or managing your Housekeeping? N  Some recent data might be hidden    Patient Care Team: Juline Patch, MD as PCP - General (Family Medicine)    Assessment:   This is a routine wellness examination for Caroline Harris.  Exercise Activities and Dietary recommendations Current Exercise Habits: Home exercise routine, Type of exercise: walking, Time (Minutes): 30, Frequency (Times/Week): 2, Weekly Exercise (Minutes/Week): 60, Intensity: Mild, Exercise limited by: None identified  Goals    . Quit Smoking     Pt states she would like to quit smoking over the next year.        Fall Risk Fall Risk  04/05/2018 04/14/2017 08/13/2015 08/10/2014  Falls in the past year? 0 No No No  Number falls in past yr: 0 - - -  Injury with Fall? 0 - - -  Follow up Falls prevention discussed - - -    FALL RISK PREVENTION PERTAINING TO THE HOME:  Any stairs in or around the home? Yes  If so, do they handrails? Yes   Home free of loose  throw rugs in walkways, pet beds, electrical cords, etc? Yes  Adequate lighting in your home to reduce risk of falls? Yes   ASSISTIVE DEVICES UTILIZED TO PREVENT FALLS:  Life alert? No  Use of a cane, walker or w/c? No  Grab bars in the bathroom? No  Shower chair or bench in shower? No  Elevated toilet seat or a handicapped toilet? Yes  DME ORDERS:  DME order needed?  No  TIMED UP AND GO:  Was the test performed? Yes .  Length of time to ambulate 10 feet: 5 sec.   GAIT:  Appearance of gait: Gait stead-fast and without the use of an assistive device.   Education: Fall risk prevention has been discussed.  Intervention(s) required? No   Depression Screen PHQ 2/9 Scores 04/05/2018 04/14/2017 08/13/2015 08/10/2014  PHQ - 2 Score 0 0 0 0  PHQ- 9 Score - 1 - -     Cognitive Function     6CIT Screen 04/05/2018  What Year? 0 points  What month? 0 points  What time? 0 points  Count back from 20 0 points  Months in reverse 0 points  Repeat phrase 0 points  Total Score 0    Immunization History  Administered Date(s) Administered  . Influenza, High Dose Seasonal PF 11/25/2017  . Influenza,inj,Quad PF,6+ Mos 01/04/2015, 12/13/2015  . Influenza-Unspecified 11/20/2016, 11/25/2017  . Pneumococcal Conjugate-13 08/13/2015  . Pneumococcal Polysaccharide-23 03/21/2011  . Tdap 03/21/2011  . Zoster 02/18/2015  . Zoster Recombinat (Shingrix) 12/02/2017, 02/02/2018    Qualifies for Shingles Vaccine? Shingrix series completed 2019.  Tdap: Up to date  Flu Vaccine: Up to date  Pneumococcal Vaccine: Up to date   Screening Tests Health Maintenance  Topic Date Due  . MAMMOGRAM  03/04/2020  . TETANUS/TDAP  03/20/2021  . COLONOSCOPY  06/02/2021  . INFLUENZA VACCINE  Completed  . DEXA SCAN  Completed  . Hepatitis C Screening  Completed  . PNA vac Low Risk Adult  Completed    Cancer Screenings:  Colorectal Screening: Completed 07/22/16. Repeat every 5 years.  Mammogram:  Completed 03/04/18. Repeat every year  Bone Density: Completed 06/10/12. Results reflect  OSTEOPENIA. Repeat every 2 years. Ordered today. Pt provided with contact information and advised to call to schedule appt.   Lung Cancer Screening: (Low Dose CT Chest recommended if Age 1-80 years, 30 pack-year currently smoking OR have quit w/in 15years.) does not qualify.   Additional Screening:  Hepatitis C Screening: does qualify; Completed 02/22/16.  Vision Screening: Recommended annual ophthalmology exams for early detection of glaucoma and other disorders of the eye. Is the patient up to date with their annual eye exam?  Yes  Who is the provider or what is the name of the office in which the pt attends annual eye exams? [facility] .  Dental Screening: Recommended annual dental exams for proper oral hygiene  Community Resource Referral:  CRR required this visit?  No        Plan:      I have personally reviewed and addressed the Medicare Annual Wellness questionnaire and have noted the following in the patient's chart:  A. Medical and social history B. Use of alcohol, tobacco or illicit drugs  C. Current medications and supplements D. Functional ability and status E.  Nutritional status F.  Physical activity G. Advance directives H. List of other physicians I.  Hospitalizations, surgeries, and ER visits in previous 12 months [facility] such as hearing and vision if needed, cognitive and depression L. Referrals and appointments   In addition, I have reviewed and discussed with patient certain preventive protocols, quality metrics, and best practice recommendations. A written personalized care plan for preventive services as well as general preventive health recommendations were provided to patient.   Signed,  Clemetine Marker, LPN Nurse Health Advisor   Nurse Notes: none

## 2018-04-15 ENCOUNTER — Encounter: Payer: Self-pay | Admitting: Family Medicine

## 2018-04-15 ENCOUNTER — Ambulatory Visit (INDEPENDENT_AMBULATORY_CARE_PROVIDER_SITE_OTHER): Payer: Medicare Other | Admitting: Family Medicine

## 2018-04-15 ENCOUNTER — Other Ambulatory Visit: Payer: Self-pay

## 2018-04-15 VITALS — BP 110/64 | HR 68 | Temp 98.0°F | Resp 15 | Ht 67.0 in | Wt 148.0 lb

## 2018-04-15 DIAGNOSIS — J101 Influenza due to other identified influenza virus with other respiratory manifestations: Secondary | ICD-10-CM | POA: Diagnosis not present

## 2018-04-15 DIAGNOSIS — E860 Dehydration: Secondary | ICD-10-CM | POA: Diagnosis not present

## 2018-04-15 DIAGNOSIS — R110 Nausea: Secondary | ICD-10-CM

## 2018-04-15 DIAGNOSIS — R509 Fever, unspecified: Secondary | ICD-10-CM

## 2018-04-15 LAB — POCT INFLUENZA A/B
Influenza A, POC: NEGATIVE
Influenza B, POC: POSITIVE — AB

## 2018-04-15 MED ORDER — ONDANSETRON HCL 4 MG PO TABS: 4.0000 mg | ORAL_TABLET | Freq: Three times a day (TID) | ORAL | 0 refills | Status: DC | PRN

## 2018-04-15 MED ORDER — OSELTAMIVIR PHOSPHATE 75 MG PO CAPS: 75.0000 mg | ORAL_CAPSULE | Freq: Two times a day (BID) | ORAL | 0 refills | Status: DC

## 2018-04-15 NOTE — Progress Notes (Signed)
Date:  04/15/2018   Name:  Caroline Harris   DOB:  [birth year]   MRN:  [id number]   Chief Complaint: Nausea (some fever started Tue) and Headache (started Tue and some bodyache )  Headache   This is a new problem. The current episode started in the past 7 days (tuesday). The problem occurs intermittently. The pain is located in the bilateral and temporal region. The pain quality is similar to prior headaches. The quality of the pain is described as aching. The pain is at a severity of 4/10. The pain is moderate. Associated symptoms include a fever, nausea and vomiting. Pertinent negatives include no abdominal pain, back pain, blurred vision, coughing, dizziness, ear pain, eye pain, eye redness, facial sweating, numbness, phonophobia, photophobia, rhinorrhea, sinus pressure, sore throat, weakness or weight loss.  Emesis   This is a new (template for nausea) problem. The current episode started in the past 7 days. The problem has been waxing and waning. Associated symptoms include chills, diarrhea, a fever and headaches. Pertinent negatives include no abdominal pain, arthralgias, chest pain, coughing, dizziness, myalgias, sweats, URI or weight loss. Risk factors include ill contacts.    Review of Systems  Constitutional: Positive for chills and fever. Negative for fatigue, unexpected weight change and weight loss.  HENT: Negative for congestion, ear discharge, ear pain, rhinorrhea, sinus pressure, sneezing and sore throat.   Eyes: Negative for blurred vision, photophobia, pain, discharge, redness and itching.  Respiratory: Negative for cough, shortness of breath, wheezing and stridor.   Cardiovascular: Negative for chest pain.  Gastrointestinal: Positive for diarrhea, nausea and vomiting. Negative for abdominal pain, blood in stool and constipation.  Endocrine: Negative for cold intolerance, heat intolerance, polydipsia, polyphagia and polyuria.  Genitourinary: Negative for dysuria, flank pain,  frequency, hematuria, menstrual problem, pelvic pain, urgency, vaginal bleeding and vaginal discharge.  Musculoskeletal: Negative for arthralgias, back pain and myalgias.  Skin: Negative for rash.  Allergic/Immunologic: Negative for environmental allergies and food allergies.  Neurological: Positive for headaches. Negative for dizziness, weakness, light-headedness and numbness.  Hematological: Negative for adenopathy. Does not bruise/bleed easily.  Psychiatric/Behavioral: Negative for dysphoric mood. The patient is not nervous/anxious.     Patient Active Problem List   Diagnosis Date Noted  . COPD, moderate ([place]) 08/23/2015  . Asthma, mild intermittent 06/07/2015  . Hyperlipidemia 06/07/2015    No Known Allergies  Past Surgical History:  Procedure Laterality Date  . ABDOMINAL HYSTERECTOMY    . COLONOSCOPY    . TONSILLECTOMY      Social History   Tobacco Use  . Smoking status: Current Some Day Smoker    Packs/day: 0.50    Years: 50.00    Pack years: 25.00    Types: Cigarettes  . Smokeless tobacco: Never Used  Substance Use Topics  . Alcohol use: Yes    Alcohol/week: 1.0 standard drinks    Types: 1 Glasses of wine per week  . Drug use: No     Medication list has been reviewed and updated.  Current Meds  Medication Sig  . Acetaminophen (TYLENOL) 325 MG CAPS Take by mouth.  Marland Kitchen albuterol (PROVENTIL HFA;VENTOLIN HFA) 108 (90 Base) MCG/ACT inhaler Inhale 2 puffs into the lungs every 6 (six) hours as needed for wheezing or shortness of breath.  Marland Kitchen aspirin 81 MG tablet Take 81 mg by mouth daily.  . budesonide-formoterol (SYMBICORT) 160-4.5 MCG/ACT inhaler Inhale 2 puffs into the lungs 2 (two) times daily.  . calcium carbonate 1250 MG capsule Take  1,250 mg by mouth 2 (two) times daily with a meal.  . lovastatin (MEVACOR) 20 MG tablet TAKE 1 TABLET BY MOUTH DAILY  . Melatonin 10 MG TABS Take 1 tablet by mouth at bedtime.  . montelukast (SINGULAIR) 10 MG tablet TAKE 1 TABLET(10  MG) BY MOUTH DAILY  . Multiple Vitamins-Minerals (CENTRUM SILVER ADULT 50+ PO) Take 1 capsule by mouth daily at 6 (six) AM.  . rOPINIRole (REQUIP) 0.25 MG tablet TAKE 1 TABLET(0.25 MG) BY MOUTH AT BEDTIME (Patient taking differently: Pt takes very rarely)    PHQ 2/9 Scores 04/05/2018 04/14/2017 08/13/2015 08/10/2014  PHQ - 2 Score 0 0 0 0  PHQ- 9 Score - 1 - -    Physical Exam Vitals signs and nursing note reviewed.  Constitutional:      General: She is not in acute distress.    Appearance: She is not diaphoretic.  HENT:     Head: Normocephalic and atraumatic.     Right Ear: Tympanic membrane, ear canal and external ear normal.     Left Ear: Tympanic membrane, ear canal and external ear normal.     Nose: Nose normal.     Right Sinus: No maxillary sinus tenderness.     Left Sinus: No maxillary sinus tenderness.     Mouth/Throat:     Lips: Pink.     Mouth: Mucous membranes are dry.  Eyes:     General:        Right eye: No discharge.        Left eye: No discharge.     Conjunctiva/sclera: Conjunctivae normal.     Pupils: Pupils are equal, round, and reactive to light.  Neck:     Musculoskeletal: Normal range of motion and neck supple.     Thyroid: No thyromegaly.     Vascular: No JVD.  Cardiovascular:     Rate and Rhythm: Normal rate and regular rhythm.     Heart sounds: Normal heart sounds. No murmur. No friction rub. No gallop.   Pulmonary:     Effort: Pulmonary effort is normal.     Breath sounds: Normal breath sounds. No wheezing, rhonchi or rales.  Chest:     Chest wall: No tenderness.  Abdominal:     General: Bowel sounds are normal. There is no distension.     Palpations: Abdomen is soft. There is no mass.     Tenderness: There is no abdominal tenderness. There is no guarding.  Musculoskeletal: Normal range of motion.  Lymphadenopathy:     Cervical: No cervical adenopathy.  Skin:    General: Skin is warm and dry.  Neurological:     Mental Status: She is alert.      Deep Tendon Reflexes: Reflexes are normal and symmetric.     BP 110/64   Pulse 68   Temp 98 F (36.7 C) (Oral)   Resp 15   Ht 5\' 7"  (1.702 m)   Wt 148 lb (67.1 kg)   SpO2 97%   BMI 23.18 kg/m   Assessment and Plan:  1. Fever and chills Onset of fever and chills last Saturday.  Patient has a positive influenza B on testing.  Will initiate Tamiflu 75 mg twice a day for 5 days - POCT Influenza A/B - oseltamivir (TAMIFLU) 75 MG capsule; Take 1 capsule (75 mg total) by mouth 2 (two) times daily.  Dispense: 10 capsule; Refill: 0  2. Influenza B Patient has a positive influenza B which she is been given medication. -  oseltamivir (TAMIFLU) 75 MG capsule; Take 1 capsule (75 mg total) by mouth 2 (two) times daily.  Dispense: 10 capsule; Refill: 0  3. Nausea Patient has nausea with occasional diarrhea this may be a manifestation of the influenza B or testing of viral syndrome.  Zofran 4 mg every 8 hours as needed nausea vomiting prescribed. - ondansetron (ZOFRAN) 4 MG tablet; Take 1 tablet (4 mg total) by mouth every 8 (eight) hours as needed for nausea or vomiting.  Dispense: 20 tablet; Refill: 0  4. Dehydration Has dehydration due to decreased oral intake of fluids patient will push fluid intake over the next 24 to 48 hours.

## 2018-04-16 ENCOUNTER — Encounter: Payer: Self-pay | Admitting: Family Medicine

## 2018-04-26 ENCOUNTER — Other Ambulatory Visit: Payer: Self-pay

## 2018-04-26 ENCOUNTER — Ambulatory Visit
Admission: RE | Admit: 2018-04-26 | Discharge: 2018-04-26 | Disposition: A | Discharge status: Home / Self Care | Payer: Medicare Other | Source: Ambulatory Visit | Attending: Family Medicine | Admitting: Family Medicine

## 2018-04-26 DIAGNOSIS — Z780 Asymptomatic menopausal state: Secondary | ICD-10-CM | POA: Diagnosis present

## 2018-05-04 ENCOUNTER — Other Ambulatory Visit: Payer: Self-pay | Admitting: Family Medicine

## 2018-05-04 ENCOUNTER — Encounter: Payer: Self-pay | Admitting: Family Medicine

## 2018-05-04 DIAGNOSIS — E785 Hyperlipidemia, unspecified: Secondary | ICD-10-CM

## 2018-05-04 MED ORDER — LOVASTATIN 20 MG PO TABS: 20.0000 mg | ORAL_TABLET | Freq: Every day | ORAL | 1 refills | Status: DC

## 2018-05-04 NOTE — Progress Notes (Unsigned)
Sent in Atorvastatin

## 2018-05-25 DIAGNOSIS — K52831 Collagenous colitis: Secondary | ICD-10-CM | POA: Diagnosis not present

## 2018-05-27 ENCOUNTER — Ambulatory Visit (INDEPENDENT_AMBULATORY_CARE_PROVIDER_SITE_OTHER): Payer: Medicare Other | Admitting: Family Medicine

## 2018-05-27 ENCOUNTER — Encounter: Payer: Self-pay | Admitting: Family Medicine

## 2018-05-27 VITALS — BP 129/76 | HR 61 | Temp 98.4°F | Ht 67.0 in | Wt 142.0 lb

## 2018-05-27 DIAGNOSIS — J0140 Acute pansinusitis, unspecified: Secondary | ICD-10-CM

## 2018-05-27 MED ORDER — AMOXICILLIN 500 MG PO CAPS: 500.0000 mg | ORAL_CAPSULE | Freq: Three times a day (TID) | ORAL | 0 refills | Status: DC

## 2018-05-27 NOTE — Progress Notes (Signed)
Date:  05/27/2018   Name:  Caroline Harris   DOB:  09-Sep-1945   MRN:  [id number]   Chief Complaint: No chief complaint on file.  .I connected withthis patient, Caroline Harris, by telephoneat the patient's home.  I verified that I am speaking with the correct person using two identifiers. This visit was conducted via telephone due to the Covid-19 outbreak from my office at [facility] in [place], [place]. I discussed the limitations, risks, security and privacy concerns of performing an evaluation and management service by telephone. I also discussed with the patient that there may be a patient responsible charge related to this service. The patient expressed understanding and agreed to proceed. Sinusitis  This is a new problem. The current episode started 1 to 4 weeks ago. The problem has been gradually worsening since onset. There has been no fever. The fever has been present for 3 to 4 days. The pain is moderate. Pertinent negatives include no chills, congestion, coughing, diaphoresis, ear pain, headaches, hoarse voice, neck pain, shortness of breath, sinus pressure, sneezing, sore throat or swollen glands. Past treatments include nothing. The treatment provided moderate relief.    Review of Systems  Constitutional: Negative.  Negative for chills, diaphoresis, fatigue, fever and unexpected weight change.  HENT: Negative for congestion, ear discharge, ear pain, hoarse voice, rhinorrhea, sinus pressure, sneezing and sore throat.   Eyes: Negative for photophobia, pain, discharge, redness and itching.  Respiratory: Negative for cough, shortness of breath, wheezing and stridor.   Gastrointestinal: Negative for abdominal pain, blood in stool, constipation, diarrhea, nausea and vomiting.  Endocrine: Negative for cold intolerance, heat intolerance, polydipsia, polyphagia and polyuria.  Genitourinary: Negative for dysuria, flank pain, frequency, hematuria, menstrual problem, pelvic pain, urgency,  vaginal bleeding and vaginal discharge.  Musculoskeletal: Negative for arthralgias, back pain, myalgias and neck pain.  Skin: Negative for rash.  Allergic/Immunologic: Negative for environmental allergies and food allergies.  Neurological: Negative for dizziness, weakness, light-headedness, numbness and headaches.  Hematological: Negative for adenopathy. Does not bruise/bleed easily.  Psychiatric/Behavioral: Negative for dysphoric mood. The patient is not nervous/anxious.     Patient Active Problem List   Diagnosis Date Noted  . COPD, moderate ([place]) 08/23/2015  . Asthma, mild intermittent 06/07/2015  . Hyperlipidemia 06/07/2015    No Known Allergies  Past Surgical History:  Procedure Laterality Date  . ABDOMINAL HYSTERECTOMY    . COLONOSCOPY    . TONSILLECTOMY      Social History   Tobacco Use  . Smoking status: Current Some Day Smoker    Packs/day: 0.50    Years: 50.00    Pack years: 25.00    Types: Cigarettes  . Smokeless tobacco: Never Used  Substance Use Topics  . Alcohol use: Yes    Alcohol/week: 1.0 standard drinks    Types: 1 Glasses of wine per week  . Drug use: No     Medication list has been reviewed and updated.  No outpatient medications have been marked as taking for the 05/27/18 encounter (Appointment) with Juline Patch, MD.    [facility] 2/9 Scores 04/05/2018 04/14/2017 08/13/2015 08/10/2014  PHQ - 2 Score 0 0 0 0  PHQ- 9 Score - 1 - -    BP Readings from Last 3 Encounters:  04/15/18 110/64  04/05/18 120/80  03/02/18 124/78    Physical Exam Vitals signs and nursing note reviewed.     Wt Readings from Last 3 Encounters:  04/15/18 148 lb (67.1 kg)  04/05/18  147 lb 3.2 oz (66.8 kg)  03/02/18 147 lb (66.7 kg)    There were no vitals taken for this visit.  Assessment and Plan: I spent 10 minutes with this patient, More than 50% of that time was spent in voice to voice education, counseling and care coordination. 1. Subacute pansinusitis Either  subacute but it may be somewhat chronic in nature patient is having this awful smell taste this is associated with something in the back of the throat but there is no productiveness of this this is behaving like a sinus infection as patient is on budesonide because of a colitis issue recently started and will initiate amoxicillin 500 mg 3 times a day has had problems with medicine in the past but only because of the GI issues.  Recheck in about 10 to 14 days and possibly next step would be x-rays to look at the sinus issue as well as the possibility of ranging ear nose and throat to take a look to see what may be going on this is thing that is recently started somewhat concerning about the possibility of an infection of either bacterial nature or another organism. - amoxicillin (AMOXIL) 500 MG capsule; Take 1 capsule (500 mg total) by mouth 3 (three) times daily.  Dispense: 30 capsule; Refill: 0

## 2018-07-14 DIAGNOSIS — L578 Other skin changes due to chronic exposure to nonionizing radiation: Secondary | ICD-10-CM | POA: Diagnosis not present

## 2018-07-14 DIAGNOSIS — L218 Other seborrheic dermatitis: Secondary | ICD-10-CM | POA: Diagnosis not present

## 2018-07-14 DIAGNOSIS — Z86018 Personal history of other benign neoplasm: Secondary | ICD-10-CM | POA: Diagnosis not present

## 2018-07-14 DIAGNOSIS — Z872 Personal history of diseases of the skin and subcutaneous tissue: Secondary | ICD-10-CM | POA: Diagnosis not present

## 2018-07-14 DIAGNOSIS — L570 Actinic keratosis: Secondary | ICD-10-CM | POA: Diagnosis not present

## 2018-07-14 DIAGNOSIS — Z85828 Personal history of other malignant neoplasm of skin: Secondary | ICD-10-CM | POA: Diagnosis not present

## 2018-08-04 DIAGNOSIS — K52831 Collagenous colitis: Secondary | ICD-10-CM | POA: Diagnosis not present

## 2018-11-05 ENCOUNTER — Ambulatory Visit: Payer: Medicare Other

## 2018-11-05 ENCOUNTER — Ambulatory Visit (INDEPENDENT_AMBULATORY_CARE_PROVIDER_SITE_OTHER): Payer: Medicare Other | Admitting: Family Medicine

## 2018-11-05 ENCOUNTER — Other Ambulatory Visit: Payer: Self-pay

## 2018-11-05 ENCOUNTER — Encounter: Payer: Self-pay | Admitting: Family Medicine

## 2018-11-05 VITALS — BP 112/78 | HR 55 | Resp 16 | Ht 67.0 in | Wt 149.4 lb

## 2018-11-05 DIAGNOSIS — E785 Hyperlipidemia, unspecified: Secondary | ICD-10-CM | POA: Diagnosis not present

## 2018-11-05 DIAGNOSIS — Z23 Encounter for immunization: Secondary | ICD-10-CM | POA: Diagnosis not present

## 2018-11-05 DIAGNOSIS — J4520 Mild intermittent asthma, uncomplicated: Secondary | ICD-10-CM

## 2018-11-05 DIAGNOSIS — I700 Atherosclerosis of aorta: Secondary | ICD-10-CM | POA: Diagnosis not present

## 2018-11-05 MED ORDER — MONTELUKAST SODIUM 10 MG PO TABS: ORAL_TABLET | ORAL | 3 refills | Status: DC

## 2018-11-05 MED ORDER — LOVASTATIN 20 MG PO TABS: 20.0000 mg | ORAL_TABLET | Freq: Every day | ORAL | 2 refills | Status: DC

## 2018-11-05 NOTE — Progress Notes (Signed)
Date:  11/05/2018   Name:  Caroline Harris   DOB:  [birth year]   MRN:  [id number]   Chief Complaint: Hyperlipidemia and Insomnia (feels she gets no sleep )  Hyperlipidemia This is a chronic problem. The current episode started more than 1 year ago. The problem is controlled. Recent lipid tests were reviewed and are normal. She has no history of chronic renal disease, diabetes, hypothyroidism, liver disease, obesity or nephrotic syndrome. There are no known factors aggravating her hyperlipidemia. Pertinent negatives include no chest pain, focal sensory loss, focal weakness, leg pain, myalgias or shortness of breath. Current antihyperlipidemic treatment includes statins. The current treatment provides mild improvement of lipids. There are no compliance problems.  There are no known risk factors for coronary artery disease.  Insomnia Primary symptoms: no fragmented sleep, no sleep disturbance, difficulty falling asleep, no somnolence, no frequent awakening, no premature morning awakening, no malaise/fatigue, no napping.   The current episode started more than one year. The problem occurs intermittently. The problem has been waxing and waning since onset. The symptoms are aggravated by anxiety. The treatment provided moderate relief. PMH includes: associated symptoms present, no hypertension, no depression.    Review of Systems  Constitutional: Negative for chills, fever and malaise/fatigue.  HENT: Negative for drooling, ear discharge, ear pain and sore throat.   Respiratory: Negative for cough, shortness of breath and wheezing.   Cardiovascular: Negative for chest pain, palpitations and leg swelling.  Gastrointestinal: Negative for abdominal pain, blood in stool, constipation, diarrhea and nausea.  Endocrine: Negative for polydipsia.  Genitourinary: Negative for dysuria, frequency, hematuria and urgency.  Musculoskeletal: Negative for back pain, myalgias and neck pain.  Skin: Negative for rash.   Allergic/Immunologic: Negative for environmental allergies.  Neurological: Negative for dizziness, focal weakness and headaches.  Hematological: Does not bruise/bleed easily.  Psychiatric/Behavioral: Negative for depression, sleep disturbance and suicidal ideas. The patient has insomnia. The patient is not nervous/anxious.     Patient Active Problem List   Diagnosis Date Noted  . COPD, moderate ([place]) 08/23/2015  . Asthma, mild intermittent 06/07/2015  . Hyperlipidemia 06/07/2015    No Known Allergies  Past Surgical History:  Procedure Laterality Date  . ABDOMINAL HYSTERECTOMY    . COLONOSCOPY    . TONSILLECTOMY      Social History   Tobacco Use  . Smoking status: Current Some Day Smoker    Packs/day: 0.50    Years: 50.00    Pack years: 25.00    Types: Cigarettes    Start date: 02/17/1958  . Smokeless tobacco: Never Used  . Tobacco comment: aware needs to stop   Substance Use Topics  . Alcohol use: Yes    Alcohol/week: 1.0 standard drinks    Types: 1 Glasses of wine per week  . Drug use: No     Medication list has been reviewed and updated.  Current Meds  Medication Sig  . albuterol (PROVENTIL HFA;VENTOLIN HFA) 108 (90 Base) MCG/ACT inhaler Inhale 2 puffs into the lungs every 6 (six) hours as needed for wheezing or shortness of breath.  Marland Kitchen aspirin 81 MG tablet Take 81 mg by mouth daily.  . budesonide (ENTOCORT EC) 3 MG 24 hr capsule Take 3 capsules by mouth every morning. Dr Vira Agar  . budesonide-formoterol (SYMBICORT) 160-4.5 MCG/ACT inhaler Inhale 2 puffs into the lungs 2 (two) times daily.  . calcium carbonate 1250 MG capsule Take 1,250 mg by mouth 2 (two) times daily with a meal.  . lovastatin (MEVACOR)  20 MG tablet Take 1 tablet (20 mg total) by mouth daily.  . Melatonin 10 MG TABS Take 1 tablet by mouth at bedtime.  . montelukast (SINGULAIR) 10 MG tablet TAKE 1 TABLET(10 MG) BY MOUTH DAILY  . Multiple Vitamins-Minerals (CENTRUM SILVER ADULT 50+ PO) Take 1  capsule by mouth daily at 6 (six) AM.  . rOPINIRole (REQUIP) 0.25 MG tablet TAKE 1 TABLET(0.25 MG) BY MOUTH AT BEDTIME (Patient taking differently: Pt takes very rarely)    PHQ 2/9 Scores 11/05/2018 05/27/2018 04/05/2018 04/14/2017  PHQ - 2 Score 0 0 0 0  PHQ- 9 Score - 0 - 1    BP Readings from Last 3 Encounters:  11/05/18 112/78  05/27/18 129/76  04/15/18 110/64    Physical Exam Vitals signs and nursing note reviewed.  Constitutional:      Appearance: She is well-developed.  HENT:     Head: Normocephalic.     Right Ear: Tympanic membrane, ear canal and external ear normal.     Left Ear: Tympanic membrane, ear canal and external ear normal.     Nose: Nose normal.     Mouth/Throat:     Pharynx: No oropharyngeal exudate or posterior oropharyngeal erythema.  Eyes:     General: Lids are everted, no foreign bodies appreciated. No scleral icterus.       Left eye: No foreign body or hordeolum.     Conjunctiva/sclera: Conjunctivae normal.     Right eye: Right conjunctiva is not injected.     Left eye: Left conjunctiva is not injected.     Pupils: Pupils are equal, round, and reactive to light.  Neck:     Musculoskeletal: Normal range of motion and neck supple.     Thyroid: No thyromegaly.     Vascular: No JVD.     Trachea: No tracheal deviation.  Cardiovascular:     Rate and Rhythm: Normal rate and regular rhythm.     Heart sounds: Normal heart sounds. No murmur. No friction rub. No gallop.   Pulmonary:     Effort: Pulmonary effort is normal. No respiratory distress.     Breath sounds: Normal breath sounds. No wheezing, rhonchi or rales.  Abdominal:     General: Bowel sounds are normal.     Palpations: Abdomen is soft. There is no mass.     Tenderness: There is no abdominal tenderness. There is no guarding or rebound.  Musculoskeletal: Normal range of motion.        General: No tenderness.  Lymphadenopathy:     Cervical: No cervical adenopathy.  Skin:    General: Skin is  warm.     Findings: No rash.  Neurological:     Mental Status: She is alert and oriented to person, place, and time.     Cranial Nerves: No cranial nerve deficit.     Deep Tendon Reflexes: Reflexes normal.  Psychiatric:        Mood and Affect: Mood is not anxious or depressed.     Wt Readings from Last 3 Encounters:  11/05/18 149 lb 6.4 oz (67.8 kg)  05/27/18 142 lb (64.4 kg)  04/15/18 148 lb (67.1 kg)    BP 112/78   Pulse (!) 55   Resp 16   Ht 5\' 7"  (1.702 m)   Wt 149 lb 6.4 oz (67.8 kg)   SpO2 98%   BMI 23.40 kg/m   Assessment and Plan:  1. Aortic atherosclerosis ([place]) Patient with history of aortic atherosclerosis.  Discussed importance  of maintaining blood pressure in normal area and lowering of lipid concerns.  2. Hyperlipidemia, unspecified hyperlipidemia type Chronic.  Controlled.  Continue lovastatin 20 mg once a day. - lovastatin (MEVACOR) 20 MG tablet; Take 1 tablet (20 mg total) by mouth daily.  Dispense: 90 tablet; Refill: 2 - Lipid panel  3. Mild intermittent asthma, unspecified whether complicated Chronic.  Controlled.  This is followed with Dr. Raul Del.  Patient will continue inhalers.  We will continue Singulair 10 mg once a day. - montelukast (SINGULAIR) 10 MG tablet; TAKE 1 TABLET(10 MG) BY MOUTH DAILY  Dispense: 90 tablet; Refill: 3  4. Influenza vaccine needed Discussed and administered. - Flu Vaccine QUAD High Dose(Fluad)

## 2018-11-06 LAB — LIPID PANEL
Chol/HDL Ratio: 1.7 ratio (ref 0.0–4.4)
Cholesterol, Total: 176 mg/dL (ref 100–199)
HDL: 101 mg/dL (ref >39)
LDL Chol Calc (NIH): 63 mg/dL (ref 0–99)
Triglycerides: 60 mg/dL (ref 0–149)
VLDL Cholesterol Cal: 12 mg/dL (ref 5–40)

## 2019-03-08 DIAGNOSIS — Z1231 Encounter for screening mammogram for malignant neoplasm of breast: Secondary | ICD-10-CM | POA: Diagnosis not present

## 2019-03-10 ENCOUNTER — Other Ambulatory Visit: Payer: Self-pay

## 2019-05-11 ENCOUNTER — Ambulatory Visit (INDEPENDENT_AMBULATORY_CARE_PROVIDER_SITE_OTHER): Payer: Medicare Other

## 2019-05-11 DIAGNOSIS — Z0000 Encounter for general adult medical examination without abnormal findings: Secondary | ICD-10-CM

## 2019-05-11 NOTE — Progress Notes (Signed)
Subjective:   Caroline Harris is a 74 y.o. female who presents for an Initial Medicare Annual Wellness Visit.  Virtual Visit via Telephone Note  I connected with Caroline Harris on 05/11/19 at  3:20 PM EDT by telephone and verified that I am speaking with the correct person using two identifiers.  Medicare Annual Wellness visit completed telephonically due to Covid-19 pandemic.   Location: Patient: home Provider: office   I discussed the limitations, risks, security and privacy concerns of performing an evaluation and management service by telephone and the availability of in person appointments. The patient expressed understanding and agreed to proceed.  Some vital signs may be absent or patient reported.   Clemetine Marker, LPN    Review of Systems      Cardiac Risk Factors include: advanced age (>71men, >67 women);dyslipidemia     Objective:    There were no vitals filed for this visit. There is no height or weight on file to calculate BMI.  Advanced Directives 05/11/2019 04/05/2018 01/22/2018 08/10/2014  Does Patient Have a Medical Advance Directive? No No No No  Would patient like information on creating a medical advance directive? No - Patient declined Yes (MAU/Ambulatory/Procedural Areas - Information given) - No - patient declined information    Current Medications (verified) Outpatient Encounter Medications as of 05/11/2019  Medication Sig  . aspirin 81 MG tablet Take 81 mg by mouth daily.  . budesonide (ENTOCORT EC) 3 MG 24 hr capsule Take 3 capsules by mouth every morning. Dr Vira Agar  . calcium carbonate 1250 MG capsule Take 1,250 mg by mouth 2 (two) times daily with a meal.  . lovastatin (MEVACOR) 20 MG tablet Take 1 tablet (20 mg total) by mouth daily.  . Melatonin 10 MG TABS Take 1 tablet by mouth at bedtime.  . montelukast (SINGULAIR) 10 MG tablet TAKE 1 TABLET(10 MG) BY MOUTH DAILY  . Multiple Vitamins-Minerals (CENTRUM SILVER ADULT 50+ PO) Take 1 capsule by mouth  daily at 6 (six) AM.  . [DISCONTINUED] albuterol (PROVENTIL HFA;VENTOLIN HFA) 108 (90 Base) MCG/ACT inhaler Inhale 2 puffs into the lungs every 6 (six) hours as needed for wheezing or shortness of breath.  . [DISCONTINUED] budesonide-formoterol (SYMBICORT) 160-4.5 MCG/ACT inhaler Inhale 2 puffs into the lungs 2 (two) times daily.  . [DISCONTINUED] ondansetron (ZOFRAN) 4 MG tablet Take 1 tablet (4 mg total) by mouth every 8 (eight) hours as needed for nausea or vomiting. (Patient not taking: Reported on 11/05/2018)   No facility-administered encounter medications on file as of 05/11/2019.    Allergies (verified) Patient has no known allergies.   History: Past Medical History:  Diagnosis Date  . Collagenous colitis   . Diverticulitis   . Hyperlipidemia   . Osteopenia   . Restless leg    Past Surgical History:  Procedure Laterality Date  . ABDOMINAL HYSTERECTOMY    . COLONOSCOPY    . TONSILLECTOMY     Family History  Problem Relation Age of Onset  . Breast cancer Mother 43  . Heart attack Father    Social History   Socioeconomic History  . Marital status: Married    Spouse name: Not on file  . Number of children: 3  . Years of education: Not on file  . Highest education level: Bachelor's degree (e.g., BA, AB, BS)  Occupational History    Comment: part time music teacher at [employer]  Tobacco Use  . Smoking status: Current Some Day Smoker    Packs/day:  0.50    Years: 50.00    Pack years: 25.00    Types: Cigarettes    Start date: 02/17/1958  . Smokeless tobacco: Never Used  . Tobacco comment: aware needs to stop   Substance and Sexual Activity  . Alcohol use: Yes    Alcohol/week: 1.0 standard drinks    Types: 1 Glasses of wine per week  . Drug use: No  . Sexual activity: Not Currently  Other Topics Concern  . Not on file  Social History Narrative  . Not on file   Social Determinants of Health   Financial Resource Strain: Low Risk   . Difficulty of  Paying Living Expenses: Not hard at all  Food Insecurity: No Food Insecurity  . Worried About Charity fundraiser in the Last Year: Never true  . Ran Out of Food in the Last Year: Never true  Transportation Needs: No Transportation Needs  . Lack of Transportation (Medical): No  . Lack of Transportation (Non-Medical): No  Physical Activity: Insufficiently Active  . Days of Exercise per Week: 2 days  . Minutes of Exercise per Session: 30 min  Stress: No Stress Concern Present  . Feeling of Stress : Not at all  Social Connections: Slightly Isolated  . Frequency of Communication with Friends and Family: More than three times a week  . Frequency of Social Gatherings with Friends and Family: Three times a week  . Attends Religious Services: More than 4 times per year  . Active Member of Clubs or Organizations: No  . Attends Archivist Meetings: Never  . Marital Status: Married    Tobacco Counseling Ready to quit: Not Answered Counseling given: Not Answered Comment: aware needs to stop    Clinical Intake:  Pre-visit preparation completed: Yes  Pain : No/denies pain     Nutritional Risks: None Diabetes: No  How often do you need to have someone help you when you read instructions, pamphlets, or other written materials from your doctor or pharmacy?: 1 - Never  Interpreter Needed?: No  Information entered by :: Clemetine Marker LPN   Activities of Daily Living In your present state of health, do you have any difficulty performing the following activities: 05/11/2019  Hearing? N  Comment declines hearing aids  Vision? N  Difficulty concentrating or making decisions? N  Walking or climbing stairs? N  Dressing or bathing? N  Doing errands, shopping? N  Preparing Food and eating ? N  Using the Toilet? N  In the past six months, have you accidently leaked urine? N  Do you have problems with loss of bowel control? N  Managing your Medications? N  Managing your Finances?  N  Housekeeping or managing your Housekeeping? N  Some recent data might be hidden     Immunizations and Health Maintenance Immunization History  Administered Date(s) Administered  . Fluad Quad(high Dose 65+) 11/05/2018  . Influenza, High Dose Seasonal PF 11/25/2017  . Influenza,inj,Quad PF,6+ Mos 01/04/2015, 12/13/2015  . Influenza-Unspecified 11/20/2016, 11/25/2017  . Moderna SARS-COVID-2 Vaccination 03/18/2019, 04/15/2019  . Pneumococcal Conjugate-13 08/13/2015  . Pneumococcal Polysaccharide-23 03/21/2011  . Tdap 03/21/2011  . Zoster 02/18/2015  . Zoster Recombinat (Shingrix) 12/02/2017, 02/02/2018   There are no preventive care reminders to display for this patient.  Patient Care Team: Juline Patch, MD as PCP - General (Family Medicine)  Indicate any recent Medical Services you may have received from other than [facility] providers in the past year (date may be approximate).  Assessment:   This is a routine wellness examination for Joaquina.  Hearing/Vision screen  Hearing Screening   125Hz  250Hz  500Hz  1000Hz  2000Hz  3000Hz  4000Hz  6000Hz  8000Hz   Right ear:           Left ear:           Comments: Pt has no difficulty hearing  Vision Screening Comments: Annual vision screenings with Dr. Annamaria [facility]  Dietary issues and exercise activities discussed: Current Exercise Habits: Home exercise routine, Type of exercise: walking, Time (Minutes): 30, Frequency (Times/Week): 2, Weekly Exercise (Minutes/Week): 60, Intensity: Mild, Exercise limited by: None identified  Goals    . Quit Smoking     Pt states she would like to quit smoking over the next year.       Depression Screen PHQ 2/9 Scores 05/11/2019 05/11/2019 11/05/2018 05/27/2018 04/05/2018 04/14/2017 08/13/2015  PHQ - 2 Score 0 0 0 0 0 0 0  PHQ- 9 Score - - - 0 - 1 -    Fall Risk Fall Risk  05/11/2019 11/05/2018 04/15/2018 04/05/2018 04/14/2017  Falls in the past year? 0 0 0 0 No  Number falls in past yr: 0 0  0 0 -  Injury with Fall? 0 0 0 0 -  Risk for fall due to : No Fall Risks - - - -  Follow up Falls prevention discussed - - Falls prevention discussed -   FALL RISK PREVENTION PERTAINING TO THE HOME:  Any stairs in or around the home? Yes  If so, do they handrails? Yes   Home free of loose throw rugs in walkways, pet beds, electrical cords, etc? Yes  Adequate lighting in your home to reduce risk of falls? Yes   ASSISTIVE DEVICES UTILIZED TO PREVENT FALLS:  Life alert? No  Use of a cane, walker or w/c? No  Grab bars in the bathroom? No  Shower chair or bench in shower? No  Elevated toilet seat or a handicapped toilet? No   DME ORDERS:  DME order needed?  No   TIMED UP AND GO:  Was the test performed? No . Telephonic visit.   Education: Fall risk prevention has been discussed.  Intervention(s) required? No   Cognitive Function:     6CIT Screen 05/11/2019 04/05/2018  What Year? 0 points 0 points  What month? 0 points 0 points  What time? 0 points 0 points  Count back from 20 0 points 0 points  Months in reverse 0 points 0 points  Repeat phrase 0 points 0 points  Total Score 0 0    Screening Tests Health Maintenance  Topic Date Due  . MAMMOGRAM  03/07/2021  . TETANUS/TDAP  03/20/2021  . COLONOSCOPY  05/14/2023  . INFLUENZA VACCINE  Completed  . DEXA SCAN  Completed  . Hepatitis C Screening  Completed  . PNA vac Low Risk Adult  Completed    Qualifies for Shingles Vaccine? Yes  Shingrix series completed.   Tdap: Up to date  Flu Vaccine: Up to date  Pneumococcal Vaccine: Up to date    Cancer Screenings:  Colorectal Screening: Completed 05/14/18. Repeat every 5 years;   Mammogram: Completed 03/08/19. Repeat every year.   Bone Density: Completed 04/26/18. Results reflect OSTEOPENIA. Repeat every 2 years.   Lung Cancer Screening: (Low Dose CT Chest recommended if Age 77-80 years, 30 pack-year currently smoking OR have quit w/in 15years.) does not qualify.    Additional Screening:  Hepatitis C Screening: does not qualify; Completed 02/22/16  Vision Screening: Recommended annual ophthalmology exams for early detection of glaucoma and other disorders of the eye. Is the patient up to date with their annual eye exam?  Yes  Who is the provider or what is the name of the office in which the pt attends annual eye exams? [place] Screening: Recommended annual dental exams for proper oral hygiene  Community Resource Referral:  CRR required this visit?  No      Plan:    I have personally reviewed and addressed the Medicare Annual Wellness questionnaire and have noted the following in the patient's chart:  A. Medical and social history B. Use of alcohol, tobacco or illicit drugs  C. Current medications and supplements D. Functional ability and status E.  Nutritional status F.  Physical activity G. Advance directives H. List of other physicians I.  Hospitalizations, surgeries, and ER visits in previous 12 months J.  [place] such as hearing and vision if needed, cognitive and depression L. Referrals and appointments   In addition, I have reviewed and discussed with patient certain preventive protocols, quality metrics, and best practice recommendations. A written personalized care plan for preventive services as well as general preventive health recommendations were provided to patient.    Signed,  Clemetine Marker, LPN Nurse Health Advisor   Nurse Notes: none

## 2019-05-11 NOTE — Patient Instructions (Signed)
[name] , Thank you for taking time to come for your Medicare Wellness Visit. I appreciate your ongoing commitment to your health goals. Please review the following plan we discussed and let me know if I can assist you in the future.   Screening recommendations/referrals: Colonoscopy: done 05/14/18 Mammogram: done 03/08/19 Bone Density: done 04/26/19 Recommended yearly ophthalmology/optometry visit for glaucoma screening and checkup Recommended yearly dental visit for hygiene and checkup  Vaccinations: Influenza vaccine: done 11/05/18 Pneumococcal vaccine: done 08/13/15 Tdap vaccine: done 10/30/11 Shingles vaccine: done 12/02/17 & 02/02/18   Covid-19: done 03/18/19 & 04/15/19  Advanced directives: Please bring a copy of your health care power of attorney and living will to the office at your convenience once you have completed those documents.   Conditions/risks identified: If you wish to quit smoking, help is available. For free tobacco cessation program offerings call the [facility] at (224)344-3744 or Live Well Line at 208 631 5450. You may also visit [email or web address] or email [email or web address] for more information on other programs.   Next appointment: Please follow up in one year for your Medicare Annual Wellness visit.     Preventive Care 56 Years and Older, Female Preventive care refers to lifestyle choices and visits with your health care provider that can promote health and wellness. What does preventive care include?  A yearly physical exam. This is also called an annual well check.  Dental exams once or twice a year.  Routine eye exams. Ask your health care provider how often you should have your eyes checked.  Personal lifestyle choices, including:  Daily care of your teeth and gums.  Regular physical activity.  Eating a healthy diet.  Avoiding tobacco and drug use.  Limiting alcohol use.  Practicing safe sex.  Taking low-dose  aspirin every day.  Taking vitamin and mineral supplements as recommended by your health care provider. What happens during an annual well check? The services and screenings done by your health care provider during your annual well check will depend on your age, overall health, lifestyle risk factors, and family history of disease. Counseling  Your health care provider may ask you questions about your:  Alcohol use.  Tobacco use.  Drug use.  Emotional well-being.  Home and relationship well-being.  Sexual activity.  Eating habits.  History of falls.  Memory and ability to understand (cognition).  Work and work Statistician.  Reproductive health. Screening  You may have the following tests or measurements:  Height, weight, and BMI.  Blood pressure.  Lipid and cholesterol levels. These may be checked every 5 years, or more frequently if you are over 17 years old.  Skin check.  Lung cancer screening. You may have this screening every year starting at age 62 if you have a 30-pack-year history of smoking and currently smoke or have quit within the past 15 years.  Fecal occult blood test (FOBT) of the stool. You may have this test every year starting at age 69.  Flexible sigmoidoscopy or colonoscopy. You may have a sigmoidoscopy every 5 years or a colonoscopy every 10 years starting at age 21.  Hepatitis C blood test.  Hepatitis B blood test.  Sexually transmitted disease (STD) testing.  Diabetes screening. This is done by checking your blood sugar (glucose) after you have not eaten for a while (fasting). You may have this done every 1-3 years.  Bone density scan. This is done to screen for osteoporosis. You may have this done starting at age 95.  Mammogram. This may be done every 1-2 years. Talk to your health care provider about how often you should have regular mammograms. Talk with your health care provider about your test results, treatment options, and if  necessary, the need for more tests. Vaccines  Your health care provider may recommend certain vaccines, such as:  Influenza vaccine. This is recommended every year.  Tetanus, diphtheria, and acellular pertussis (Tdap, Td) vaccine. You may need a Td booster every 10 years.  Zoster vaccine. You may need this after age 23.  Pneumococcal 13-valent conjugate (PCV13) vaccine. One dose is recommended after age 26.  Pneumococcal polysaccharide (PPSV23) vaccine. One dose is recommended after age 66. Talk to your health care provider about which screenings and vaccines you need and how often you need them. This information is not intended to replace advice given to you by your health care provider. Make sure you discuss any questions you have with your health care provider. Document Released: 03/02/2015 Document Revised: 10/24/2015 Document Reviewed: 12/05/2014 Elsevier Interactive Patient Education  2017 [place] Prevention in the Home Falls can cause injuries. They can happen to people of all ages. There are many things you can do to make your home safe and to help prevent falls. What can I do on the outside of my home?  Regularly fix the edges of walkways and driveways and fix any cracks.  Remove anything that might make you trip as you walk through a door, such as a raised step or threshold.  Trim any bushes or trees on the path to your home.  Use bright outdoor lighting.  Clear any walking paths of anything that might make someone trip, such as rocks or tools.  Regularly check to see if handrails are loose or broken. Make sure that both sides of any steps have handrails.  Any raised decks and porches should have guardrails on the edges.  Have any leaves, snow, or ice cleared regularly.  Use sand or salt on walking paths during winter.  Clean up any spills in your garage right away. This includes oil or grease spills. What can I do in the bathroom?  Use night lights.   Install grab bars by the toilet and in the tub and shower. Do not use towel bars as grab bars.  Use non-skid mats or decals in the tub or shower.  If you need to sit down in the shower, use a plastic, non-slip stool.  Keep the floor dry. Clean up any water that spills on the floor as soon as it happens.  Remove soap buildup in the tub or shower regularly.  Attach bath mats securely with double-sided non-slip rug tape.  Do not have throw rugs and other things on the floor that can make you trip. What can I do in the bedroom?  Use night lights.  Make sure that you have a light by your bed that is easy to reach.  Do not use any sheets or blankets that are too big for your bed. They should not hang down onto the floor.  Have a firm chair that has side arms. You can use this for support while you get dressed.  Do not have throw rugs and other things on the floor that can make you trip. What can I do in the kitchen?  Clean up any spills right away.  Avoid walking on wet floors.  Keep items that you use a lot in easy-to-reach places.  If you need to reach  something above you, use a strong step stool that has a grab bar.  Keep electrical cords out of the way.  Do not use floor polish or wax that makes floors slippery. If you must use wax, use non-skid floor wax.  Do not have throw rugs and other things on the floor that can make you trip. What can I do with my stairs?  Do not leave any items on the stairs.  Make sure that there are handrails on both sides of the stairs and use them. Fix handrails that are broken or loose. Make sure that handrails are as long as the stairways.  Check any carpeting to make sure that it is firmly attached to the stairs. Fix any carpet that is loose or worn.  Avoid having throw rugs at the top or bottom of the stairs. If you do have throw rugs, attach them to the floor with carpet tape.  Make sure that you have a light switch at the top of the  stairs and the bottom of the stairs. If you do not have them, ask someone to add them for you. What else can I do to help prevent falls?  Wear shoes that:  Do not have high heels.  Have rubber bottoms.  Are comfortable and fit you well.  Are closed at the toe. Do not wear sandals.  If you use a stepladder:  Make sure that it is fully opened. Do not climb a closed stepladder.  Make sure that both sides of the stepladder are locked into place.  Ask someone to hold it for you, if possible.  Clearly mark and make sure that you can see:  Any grab bars or handrails.  First and last steps.  Where the edge of each step is.  Use tools that help you move around (mobility aids) if they are needed. These include:  Canes.  Walkers.  Scooters.  Crutches.  Turn on the lights when you go into a dark area. Replace any light bulbs as soon as they burn out.  Set up your furniture so you have a clear path. Avoid moving your furniture around.  If any of your floors are uneven, fix them.  If there are any pets around you, be aware of where they are.  Review your medicines with your doctor. Some medicines can make you feel dizzy. This can increase your chance of falling. Ask your doctor what other things that you can do to help prevent falls. This information is not intended to replace advice given to you by your health care provider. Make sure you discuss any questions you have with your health care provider. Document Released: 11/30/2008 Document Revised: 07/12/2015 Document Reviewed: 03/10/2014 Elsevier Interactive Patient Education  2017 [employer].

## 2019-06-24 ENCOUNTER — Telehealth: Payer: Self-pay | Admitting: Family Medicine

## 2019-06-24 NOTE — Telephone Encounter (Signed)
Copied from [place] [id number]. Topic: Referral - Request for Referral >> Jun 24, 2019  2:43 PM Celene Kras wrote: Has patient seen PCP for this complaint? Yes.   *If NO, is insurance requiring patient see PCP for this issue before PCP can refer them? Referral for which specialty: Gastroenterology  Preferred provider/office: [facility]// Dr. Marius Ditch  Reason for referral: Her other GI doctor retired. Please advise.

## 2019-06-24 NOTE — Telephone Encounter (Signed)
Spoke to pt and she prefers to go to [facility] for her Collagenous Colitis. I sent Ginger a message asking how to proceed with this. If notes are needed from [facility], we will need to see pt in office first. Otherwise, if they can access [facility]'s notes, she can get an appt with them. Will wait for Ginger's response on Monday.

## 2019-07-01 NOTE — Telephone Encounter (Signed)
Patient is calling to get an update on her request.  Please call patient to discuss at [phone]

## 2019-07-04 ENCOUNTER — Other Ambulatory Visit: Payer: Self-pay

## 2019-07-04 ENCOUNTER — Encounter: Payer: Self-pay | Admitting: Family Medicine

## 2019-07-04 ENCOUNTER — Ambulatory Visit (INDEPENDENT_AMBULATORY_CARE_PROVIDER_SITE_OTHER): Payer: Medicare Other | Admitting: Family Medicine

## 2019-07-04 VITALS — BP 130/80 | HR 56 | Ht 67.0 in | Wt 151.0 lb

## 2019-07-04 DIAGNOSIS — K52831 Collagenous colitis: Secondary | ICD-10-CM

## 2019-07-04 NOTE — Telephone Encounter (Signed)
Please call pt and tell her we have to have her here for a referral to GI, I spoke to Ginger at GI. Schedule for office visit- referral GI

## 2019-07-04 NOTE — Progress Notes (Signed)
Date:  07/04/2019   Name:  Caroline Harris   DOB:  02-14-1946   MRN:  [id number]   Chief Complaint: referral to gastro (collogenous collitis)  GI Problem The primary symptoms include diarrhea. Primary symptoms do not include fever, weight loss, fatigue, abdominal pain, nausea, vomiting, melena, hematemesis, jaundice, hematochezia, dysuria, myalgias, arthralgias or rash. Primary symptoms comment: collaginous colitis. The onset was gradual. The problem has been gradually improving.  The illness does not include chills, anorexia, dysphagia, odynophagia, bloating, constipation, tenesmus, back pain or itching. Associated medical issues comments: collaginous colitis.    Lab Results  Component Value Date   CREATININE 0.73 04/20/2017   BUN 13 04/20/2017   NA 141 04/20/2017   K 4.4 04/20/2017   CL 100 04/20/2017   CO2 26 04/20/2017   Lab Results  Component Value Date   CHOL 176 11/05/2018   HDL 101 11/05/2018   LDLCALC 63 11/05/2018   TRIG 60 11/05/2018   CHOLHDL 1.7 11/05/2018   Lab Results  Component Value Date   TSH 1.120 06/01/2017   No results found for: HGBA1C Lab Results  Component Value Date   WBC 8.7 06/22/2017   HGB 11.7 (L) 06/22/2017   HCT 34.0 (L) 06/22/2017   MCV 91.0 06/22/2017   PLT 241 06/22/2017   Lab Results  Component Value Date   ALT 17 02/22/2018   AST 19 02/22/2018   ALKPHOS 37 (L) 02/22/2018   BILITOT 0.6 02/22/2018     Review of Systems  Constitutional: Negative for chills, fatigue, fever and weight loss.  Gastrointestinal: Positive for diarrhea. Negative for abdominal pain, anorexia, bloating, constipation, dysphagia, hematemesis, hematochezia, jaundice, melena, nausea and vomiting.  Genitourinary: Negative for dysuria.  Musculoskeletal: Negative for arthralgias, back pain and myalgias.  Skin: Negative for itching and rash.    Patient Active Problem List   Diagnosis Date Noted  . Aortic atherosclerosis ([place]) 11/05/2018  . COPD, moderate  ([place]) 08/23/2015  . Asthma, mild intermittent 06/07/2015  . Hyperlipidemia 06/07/2015    No Known Allergies  Past Surgical History:  Procedure Laterality Date  . ABDOMINAL HYSTERECTOMY    . COLONOSCOPY    . TONSILLECTOMY      Social History   Tobacco Use  . Smoking status: Current Some Day Smoker    Packs/day: 0.50    Years: 50.00    Pack years: 25.00    Types: Cigarettes    Start date: 02/17/1958  . Smokeless tobacco: Never Used  . Tobacco comment: aware needs to stop   Substance Use Topics  . Alcohol use: Yes    Alcohol/week: 1.0 standard drinks    Types: 1 Glasses of wine per week  . Drug use: No     Medication list has been reviewed and updated.  Current Meds  Medication Sig  . aspirin 81 MG tablet Take 81 mg by mouth daily.  . budesonide (ENTOCORT EC) 3 MG 24 hr capsule Take 2 capsules by mouth in the morning. GI  . calcium carbonate 1250 MG capsule Take 1,250 mg by mouth 2 (two) times daily with a meal.  . lovastatin (MEVACOR) 20 MG tablet Take 1 tablet (20 mg total) by mouth daily.  . Melatonin 10 MG TABS Take 1 tablet by mouth at bedtime.  . montelukast (SINGULAIR) 10 MG tablet TAKE 1 TABLET(10 MG) BY MOUTH DAILY  . Multiple Vitamins-Minerals (CENTRUM SILVER ADULT 50+ PO) Take 1 capsule by mouth daily at 6 (six) AM.    PHQ 2/9  Scores 07/04/2019 05/11/2019 05/11/2019 11/05/2018  PHQ - 2 Score 0 0 0 0  PHQ- 9 Score 0 - - -    BP Readings from Last 3 Encounters:  07/04/19 130/80  11/05/18 112/78  05/27/18 129/76    Physical Exam Vitals and nursing note reviewed.  Constitutional:      Appearance: She is well-developed.  HENT:     Head: Normocephalic.     Right Ear: Tympanic membrane, ear canal and external ear normal.     Left Ear: Tympanic membrane, ear canal and external ear normal.  Eyes:     General: Lids are everted, no foreign bodies appreciated. No scleral icterus.       Left eye: No foreign body or hordeolum.     Conjunctiva/sclera:  Conjunctivae normal.     Right eye: Right conjunctiva is not injected.     Left eye: Left conjunctiva is not injected.     Pupils: Pupils are equal, round, and reactive to light.  Neck:     Thyroid: No thyromegaly.     Vascular: No JVD.     Trachea: No tracheal deviation.  Cardiovascular:     Rate and Rhythm: Normal rate and regular rhythm.     Heart sounds: Normal heart sounds. No murmur. No friction rub. No gallop.   Pulmonary:     Effort: Pulmonary effort is normal. No respiratory distress.     Breath sounds: Normal breath sounds. No wheezing or rales.  Abdominal:     General: Bowel sounds are normal.     Palpations: Abdomen is soft. There is no mass.     Tenderness: There is no abdominal tenderness. There is no guarding or rebound.  Musculoskeletal:        General: No tenderness. Normal range of motion.     Cervical back: Normal range of motion and neck supple.  Lymphadenopathy:     Cervical: No cervical adenopathy.  Skin:    General: Skin is warm.     Findings: No rash.  Neurological:     Mental Status: She is alert and oriented to person, place, and time.     Cranial Nerves: No cranial nerve deficit.     Deep Tendon Reflexes: Reflexes normal.  Psychiatric:        Mood and Affect: Mood is not anxious or depressed.     Wt Readings from Last 3 Encounters:  07/04/19 151 lb (68.5 kg)  11/05/18 149 lb 6.4 oz (67.8 kg)  05/27/18 142 lb (64.4 kg)    BP 130/80   Pulse (!) 56   Ht 5\' 7"  (1.702 m)   Wt 151 lb (68.5 kg)   BMI 23.65 kg/m   Assessment and Plan: 1. Collagenous colitis Chronic.  Controlled.  Stable.  Patient is controlled on budesonide with reduction of diarrhea and domino cramps.  This will be continued and since her gastroenterologist is retired patient would like to see Dr. Marius Ditch and we will proceed with this referral. - Ambulatory referral to Gastroenterology

## 2019-07-14 DIAGNOSIS — Z86018 Personal history of other benign neoplasm: Secondary | ICD-10-CM | POA: Diagnosis not present

## 2019-07-14 DIAGNOSIS — Z872 Personal history of diseases of the skin and subcutaneous tissue: Secondary | ICD-10-CM | POA: Diagnosis not present

## 2019-07-14 DIAGNOSIS — L570 Actinic keratosis: Secondary | ICD-10-CM | POA: Diagnosis not present

## 2019-07-14 DIAGNOSIS — Z85828 Personal history of other malignant neoplasm of skin: Secondary | ICD-10-CM | POA: Diagnosis not present

## 2019-07-14 DIAGNOSIS — L578 Other skin changes due to chronic exposure to nonionizing radiation: Secondary | ICD-10-CM | POA: Diagnosis not present

## 2019-08-04 ENCOUNTER — Other Ambulatory Visit: Payer: Self-pay

## 2019-08-04 ENCOUNTER — Ambulatory Visit (INDEPENDENT_AMBULATORY_CARE_PROVIDER_SITE_OTHER): Payer: Medicare Other | Admitting: Gastroenterology

## 2019-08-04 ENCOUNTER — Encounter: Payer: Self-pay | Admitting: Gastroenterology

## 2019-08-04 VITALS — BP 148/82 | HR 65 | Temp 97.8°F | Ht 67.0 in | Wt 150.4 lb

## 2019-08-04 DIAGNOSIS — K52831 Collagenous colitis: Secondary | ICD-10-CM

## 2019-08-04 DIAGNOSIS — D649 Anemia, unspecified: Secondary | ICD-10-CM | POA: Diagnosis not present

## 2019-08-04 NOTE — Progress Notes (Signed)
Cephas Darby, MD [address]  [place]  [place], [place] [address]  Main: [phone]  Fax: [phone]    Gastroenterology Consultation  Referring Provider:     Juline Patch, MD Primary Care Physician:  Caroline Patch, MD Primary Gastroenterologist:  Dr. Vira Harris Reason for Consultation:     Collagenous colitis        HPI:   Caroline Harris is a 74 y.o. female referred by Dr. Juline Patch, MD  for consultation & management of collagenous colitis.  Patient is diagnosed with collagenous colitis based on colonoscopy in 04/2018 after she was having chronic nonbloody diarrhea since 09/2017.  She started on Entocort 9 mg daily which has resulted in resolution of symptoms.  She is currently on 6 mg daily, in clinical remission.  She reports having 1 bowel movement daily.  She does not have any other GI symptoms.  She wanted to discuss about long-term management of collagenous colitis.  Patient is also found to have normocytic anemia.  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 Collagenous colitis   . Diverticulitis   . Hyperlipidemia   . Osteopenia   . Restless leg     Past Surgical History:  Procedure Laterality Date  . ABDOMINAL HYSTERECTOMY    . COLONOSCOPY    . TONSILLECTOMY      Current Outpatient Medications:  .  aspirin 81 MG tablet, Take 81 mg by mouth daily., Disp: , Rfl:  .  budesonide (ENTOCORT EC) 3 MG 24 hr capsule, Take 2 capsules by mouth in the morning. GI, Disp: , Rfl:  .  calcium carbonate 1250 MG capsule, Take 1,250 mg by mouth 2 (two) times daily with a meal., Disp: , Rfl:  .  lovastatin (MEVACOR) 20 MG tablet, Take 1 tablet (20 mg total) by mouth daily., Disp: 90 tablet, Rfl: 2 .   Melatonin 10 MG TABS, Take 1 tablet by mouth at bedtime., Disp: , Rfl:  .  montelukast (SINGULAIR) 10 MG tablet, TAKE 1 TABLET(10 MG) BY MOUTH DAILY, Disp: 90 tablet, Rfl: 3 .  Multiple Vitamins-Minerals (CENTRUM SILVER ADULT 50+ PO), Take 1 capsule by mouth daily at 6 (six) AM., Disp: , Rfl:    Family History  Problem Relation Age of Onset  . Breast cancer Mother 72  . Heart attack Father      Social History   Tobacco Use  . Smoking status: Current Some Day Smoker    Packs/day: 0.50    Years: 50.00    Pack years: 25.00    Types: Cigarettes    Start date: 02/17/1958  . Smokeless tobacco: Never Used  . Tobacco comment: aware needs to stop   Vaping Use  . Vaping Use: Never used  Substance Use Topics  . Alcohol use: Yes    Alcohol/week: 1.0 standard drink    Types: 1 Glasses of wine per week  . Drug use: No    Allergies as of 08/04/2019  . (No Known Allergies)    Review of Systems:    All systems reviewed and negative except where noted in HPI.   Physical Exam:  BP (!) 148/82 (BP Location: Left Arm, Patient Position: Sitting, Cuff Size: Normal)   Pulse 65   Temp  97.8 F (36.6 C) (Oral)   Ht 5\' 7"  (1.702 m)   Wt 150 lb 6 oz (68.2 kg)   BMI 23.55 kg/m  No LMP recorded. Patient has had a hysterectomy.  General:   Alert,  Well-developed, well-nourished, pleasant and cooperative in NAD Head:  Normocephalic and atraumatic. Eyes:  Sclera clear, no icterus.   Conjunctiva pink. Ears:  Normal auditory acuity. Nose:  No deformity, discharge, or lesions. Mouth:  No deformity or lesions,oropharynx pink & moist. Neck:  Supple; no masses or thyromegaly. Lungs:  Respirations even and unlabored.  Clear throughout to auscultation.   No wheezes, crackles, or rhonchi. No acute distress. Heart:  Regular rate and rhythm; no murmurs, clicks, rubs, or gallops. Abdomen:  Normal bowel sounds. Soft, non-tender and non-distended without masses, hepatosplenomegaly or hernias noted.  No  guarding or rebound tenderness.   Rectal: Not performed Msk:  Symmetrical without gross deformities. Good, equal movement & strength bilaterally. Pulses:  Normal pulses noted. Extremities:  No clubbing or edema.  No cyanosis. Neurologic:  Alert and oriented x3;  grossly normal neurologically. Skin:  Intact without significant lesions or rashes. No jaundice. Psych:  Alert and cooperative. Normal mood and affect.  Imaging Studies: Reviewed  Assessment and Plan:   Caroline Harris is a 74 y.o. female with history of chronic tobacco use, restless leg syndrome in consultation for collagenous colitis  Collagenous colitis: Currently in clinical remission on budesonide 6 mg daily Decrease to 3 mg daily for about 3 to 4 weeks, then stop.  If diarrhea recurs, advised patient to go back to 6 mg daily Celiac panel negative  Normocytic anemia with fatigue Repeat CBC Check iron panel, B12 and folate levels   Follow up    Cephas Darby, MD

## 2019-08-05 LAB — IRON,TIBC AND FERRITIN PANEL
Ferritin: 41 ng/mL (ref 15–150)
Iron Saturation: 20 % (ref 15–55)
Iron: 61 ug/dL (ref 27–139)
Total Iron Binding Capacity: 309 ug/dL (ref 250–450)
UIBC: 248 ug/dL (ref 118–369)

## 2019-08-05 LAB — CBC
Hematocrit: 39.8 % (ref 34.0–46.6)
Hemoglobin: 14.1 g/dL (ref 11.1–15.9)
MCH: 32.5 pg (ref 26.6–33.0)
MCHC: 35.4 g/dL (ref 31.5–35.7)
MCV: 92 fL (ref 79–97)
Platelets: 257 10*3/uL (ref 150–450)
RBC: 4.34 x10E6/uL (ref 3.77–5.28)
RDW: 12.5 % (ref 11.7–15.4)
WBC: 9.9 10*3/uL (ref 3.4–10.8)

## 2019-08-05 LAB — B12 AND FOLATE PANEL
Folate: >20 ng/mL (ref >3.0)
Vitamin B-12: 651 pg/mL (ref 232–1245)

## 2019-08-16 ENCOUNTER — Encounter: Payer: Self-pay | Admitting: Family Medicine

## 2019-08-16 ENCOUNTER — Other Ambulatory Visit: Payer: Self-pay

## 2019-08-16 ENCOUNTER — Ambulatory Visit (INDEPENDENT_AMBULATORY_CARE_PROVIDER_SITE_OTHER): Payer: Medicare Other | Admitting: Family Medicine

## 2019-08-16 VITALS — BP 130/80 | HR 84 | Ht 67.0 in | Wt 150.0 lb

## 2019-08-16 DIAGNOSIS — J4520 Mild intermittent asthma, uncomplicated: Secondary | ICD-10-CM | POA: Diagnosis not present

## 2019-08-16 DIAGNOSIS — E785 Hyperlipidemia, unspecified: Secondary | ICD-10-CM

## 2019-08-16 DIAGNOSIS — R69 Illness, unspecified: Secondary | ICD-10-CM

## 2019-08-16 MED ORDER — MONTELUKAST SODIUM 10 MG PO TABS: ORAL_TABLET | ORAL | 1 refills | Status: DC

## 2019-08-16 MED ORDER — LOVASTATIN 20 MG PO TABS: 20.0000 mg | ORAL_TABLET | Freq: Every day | ORAL | 1 refills | Status: DC

## 2019-08-16 NOTE — Progress Notes (Signed)
Date:  08/16/2019   Name:  Caroline Harris   DOB:  1945/12/13   MRN:  [id number]   Chief Complaint: Hyperlipidemia and Asthma (singulair refill)  Hyperlipidemia This is a chronic problem. The current episode started more than 1 year ago. The problem is controlled. Recent lipid tests were reviewed and are normal. She has no history of chronic renal disease, diabetes, hypothyroidism, liver disease, obesity or nephrotic syndrome. There are no known factors aggravating her hyperlipidemia. Pertinent negatives include no chest pain, focal sensory loss, focal weakness, leg pain, myalgias or shortness of breath. Current antihyperlipidemic treatment includes statins. The current treatment provides moderate improvement of lipids. There are no compliance problems.  Risk factors for coronary artery disease include dyslipidemia and female sex.  Asthma There is no cough, difficulty breathing, shortness of breath or wheezing. This is a chronic problem. The current episode started more than 1 year ago. The problem occurs daily. Pertinent negatives include no appetite change, chest pain, dyspnea on exertion, ear congestion, ear pain, fever, headaches, heartburn, malaise/fatigue, myalgias, nasal congestion, orthopnea, PND, postnasal drip, rhinorrhea, sneezing, sore throat, sweats, trouble swallowing or weight loss. Her symptoms are aggravated by nothing. Her symptoms are alleviated by beta-agonist. She reports moderate improvement on treatment. Her past medical history is significant for asthma.    Lab Results  Component Value Date   CREATININE 0.73 04/20/2017   BUN 13 04/20/2017   NA 141 04/20/2017   K 4.4 04/20/2017   CL 100 04/20/2017   CO2 26 04/20/2017   Lab Results  Component Value Date   CHOL 176 11/05/2018   HDL 101 11/05/2018   LDLCALC 63 11/05/2018   TRIG 60 11/05/2018   CHOLHDL 1.7 11/05/2018   Lab Results  Component Value Date   TSH 1.120 06/01/2017   No results found for: HGBA1C Lab  Results  Component Value Date   WBC 9.9 08/04/2019   HGB 14.1 08/04/2019   HCT 39.8 08/04/2019   MCV 92 08/04/2019   PLT 257 08/04/2019   Lab Results  Component Value Date   ALT 17 02/22/2018   AST 19 02/22/2018   ALKPHOS 37 (L) 02/22/2018   BILITOT 0.6 02/22/2018     Review of Systems  Constitutional: Negative.  Negative for appetite change, chills, fatigue, fever, malaise/fatigue, unexpected weight change and weight loss.  HENT: Negative for congestion, ear discharge, ear pain, postnasal drip, rhinorrhea, sinus pressure, sneezing, sore throat and trouble swallowing.   Eyes: Negative for photophobia, pain, discharge, redness and itching.  Respiratory: Negative for cough, shortness of breath, wheezing and stridor.   Cardiovascular: Negative for chest pain, dyspnea on exertion and PND.  Gastrointestinal: Negative for abdominal pain, blood in stool, constipation, diarrhea, heartburn, nausea and vomiting.  Endocrine: Negative for cold intolerance, heat intolerance, polydipsia, polyphagia and polyuria.  Genitourinary: Negative for dysuria, flank pain, frequency, hematuria, menstrual problem, pelvic pain, urgency, vaginal bleeding and vaginal discharge.  Musculoskeletal: Negative for arthralgias, back pain and myalgias.  Skin: Negative for rash.  Allergic/Immunologic: Negative for environmental allergies and food allergies.  Neurological: Negative for dizziness, focal weakness, weakness, light-headedness, numbness and headaches.  Hematological: Negative for adenopathy. Does not bruise/bleed easily.  Psychiatric/Behavioral: Negative for dysphoric mood. The patient is not nervous/anxious.     Patient Active Problem List   Diagnosis Date Noted  . Aortic atherosclerosis ([place]) 11/05/2018  . COPD, moderate ([place]) 08/23/2015  . Asthma, mild intermittent 06/07/2015  . Hyperlipidemia 06/07/2015    No Known Allergies  Past  Surgical History:  Procedure Laterality Date  . ABDOMINAL  HYSTERECTOMY    . COLONOSCOPY    . TONSILLECTOMY      Social History   Tobacco Use  . Smoking status: Current Some Day Smoker    Packs/day: 0.50    Years: 50.00    Pack years: 25.00    Types: Cigarettes    Start date: 02/17/1958  . Smokeless tobacco: Never Used  . Tobacco comment: aware needs to stop   Vaping Use  . Vaping Use: Never used  Substance Use Topics  . Alcohol use: Yes    Alcohol/week: 1.0 standard drink    Types: 1 Glasses of wine per week  . Drug use: No     Medication list has been reviewed and updated.  Current Meds  Medication Sig  . aspirin 81 MG tablet Take 81 mg by mouth daily.  . budesonide (ENTOCORT EC) 3 MG 24 hr capsule Take 1 capsule by mouth in the morning. GI  . calcium carbonate 1250 MG capsule Take 1,250 mg by mouth 2 (two) times daily with a meal.  . lovastatin (MEVACOR) 20 MG tablet Take 1 tablet (20 mg total) by mouth daily.  . Melatonin 10 MG TABS Take 1 tablet by mouth at bedtime.  . montelukast (SINGULAIR) 10 MG tablet TAKE 1 TABLET(10 MG) BY MOUTH DAILY  . Multiple Vitamins-Minerals (CENTRUM SILVER ADULT 50+ PO) Take 1 capsule by mouth daily at 6 (six) AM.  . [DISCONTINUED] lovastatin (MEVACOR) 20 MG tablet Take 1 tablet (20 mg total) by mouth daily.  . [DISCONTINUED] montelukast (SINGULAIR) 10 MG tablet TAKE 1 TABLET(10 MG) BY MOUTH DAILY    PHQ 2/9 Scores 07/04/2019 05/11/2019 05/11/2019 11/05/2018  PHQ - 2 Score 0 0 0 0  PHQ- 9 Score 0 - - -    GAD 7 : Generalized Anxiety Score 07/04/2019  Nervous, Anxious, on Edge 0  Control/stop worrying 0  Worry too much - different things 0  Trouble relaxing 0  Restless 0  Easily annoyed or irritable 0  Afraid - awful might happen 0  Total GAD 7 Score 0    BP Readings from Last 3 Encounters:  08/16/19 130/80  08/04/19 (!) 148/82  07/04/19 130/80    Physical Exam Vitals and nursing note reviewed.  Constitutional:      General: She is not in acute distress.    Appearance: She is not  diaphoretic.  HENT:     Head: Normocephalic and atraumatic.     Right Ear: Tympanic membrane, ear canal and external ear normal.     Left Ear: Tympanic membrane, ear canal and external ear normal.     Nose: Nose normal. No congestion or rhinorrhea.  Eyes:     General:        Right eye: No discharge.        Left eye: No discharge.     Conjunctiva/sclera: Conjunctivae normal.     Pupils: Pupils are equal, round, and reactive to light.  Neck:     Thyroid: No thyromegaly.     Vascular: No JVD.  Cardiovascular:     Rate and Rhythm: Normal rate and regular rhythm.     Heart sounds: Normal heart sounds. No murmur heard.  No friction rub. No gallop.   Pulmonary:     Effort: Pulmonary effort is normal.     Breath sounds: Normal breath sounds. No wheezing, rhonchi or rales.  Chest:     Chest wall: No tenderness.  Abdominal:  General: Bowel sounds are normal.     Palpations: Abdomen is soft. There is no mass.     Tenderness: There is no abdominal tenderness. There is no guarding.  Musculoskeletal:        General: Normal range of motion.     Cervical back: Normal range of motion and neck supple.  Lymphadenopathy:     Cervical: No cervical adenopathy.  Skin:    General: Skin is warm and dry.     Capillary Refill: Capillary refill takes less than 2 seconds.  Neurological:     General: No focal deficit present.     Mental Status: She is alert.     Deep Tendon Reflexes: Reflexes are normal and symmetric.     Wt Readings from Last 3 Encounters:  08/16/19 150 lb (68 kg)  08/04/19 150 lb 6 oz (68.2 kg)  07/04/19 151 lb (68.5 kg)    BP 130/80   Pulse 84   Ht 5\' 7"  (1.702 m)   Wt 150 lb (68 kg)   BMI 23.49 kg/m   Assessment and Plan: 1. Hyperlipidemia, unspecified hyperlipidemia type Chronic.  Controlled.  Stable.  Will check lipid panel and comprehensive metabolic panel we will continue Mevacor 20 mg once a day. - Lipid Panel With LDL/HDL Ratio - Comprehensive Metabolic  Panel (CMET) - lovastatin (MEVACOR) 20 MG tablet; Take 1 tablet (20 mg total) by mouth daily.  Dispense: 90 tablet; Refill: 1  2. Mild intermittent asthma, unspecified whether complicated Mild.  Intermittent.  Stable.  Continue Singulair 10 mg once a day. - montelukast (SINGULAIR) 10 MG tablet; TAKE 1 TABLET(10 MG) BY MOUTH DAILY  Dispense: 90 tablet; Refill: 1  3. Taking medication for chronic disease Chronic.  Controlled.  Stable.  Patient had medications that she is taking that could affect GFR and liver function we will check corresponding labs. - Comprehensive Metabolic Panel (CMET)

## 2019-08-17 LAB — COMPREHENSIVE METABOLIC PANEL
ALT: 15 IU/L (ref 0–32)
AST: 21 IU/L (ref 0–40)
Albumin/Globulin Ratio: 2.3 — ABNORMAL HIGH (ref 1.2–2.2)
Albumin: 4.5 g/dL (ref 3.7–4.7)
Alkaline Phosphatase: 34 IU/L — ABNORMAL LOW (ref 48–121)
BUN/Creatinine Ratio: 22 (ref 12–28)
BUN: 15 mg/dL (ref 8–27)
Bilirubin Total: 0.3 mg/dL (ref 0.0–1.2)
CO2: 26 mmol/L (ref 20–29)
Calcium: 9.3 mg/dL (ref 8.7–10.3)
Chloride: 103 mmol/L (ref 96–106)
Creatinine, Ser: 0.69 mg/dL (ref 0.57–1.00)
GFR calc Af Amer: 98 mL/min/{1.73_m2} (ref >59)
GFR calc non Af Amer: 85 mL/min/{1.73_m2} (ref >59)
Globulin, Total: 2 g/dL (ref 1.5–4.5)
Glucose: 82 mg/dL (ref 65–99)
Potassium: 4.8 mmol/L (ref 3.5–5.2)
Sodium: 142 mmol/L (ref 134–144)
Total Protein: 6.5 g/dL (ref 6.0–8.5)

## 2019-08-17 LAB — LIPID PANEL WITH LDL/HDL RATIO
Cholesterol, Total: 188 mg/dL (ref 100–199)
HDL: 85 mg/dL (ref >39)
LDL Chol Calc (NIH): 86 mg/dL (ref 0–99)
LDL/HDL Ratio: 1 ratio (ref 0.0–3.2)
Triglycerides: 97 mg/dL (ref 0–149)
VLDL Cholesterol Cal: 17 mg/dL (ref 5–40)

## 2019-08-31 ENCOUNTER — Ambulatory Visit: Payer: Medicare Other | Admitting: Family Medicine

## 2019-09-05 ENCOUNTER — Ambulatory Visit: Payer: Medicare Other | Admitting: Family Medicine

## 2019-09-06 ENCOUNTER — Telehealth: Payer: Self-pay

## 2019-09-06 DIAGNOSIS — M79672 Pain in left foot: Secondary | ICD-10-CM | POA: Diagnosis not present

## 2019-09-06 DIAGNOSIS — M7752 Other enthesopathy of left foot: Secondary | ICD-10-CM | POA: Diagnosis not present

## 2019-09-06 DIAGNOSIS — M216X2 Other acquired deformities of left foot: Secondary | ICD-10-CM | POA: Diagnosis not present

## 2019-09-06 NOTE — Telephone Encounter (Signed)
Ok to take NSAIDs temporarily with food and alternate with tylenol  RV

## 2019-09-06 NOTE — Telephone Encounter (Signed)
Patient is calling because she saw a Podiatrist today and she has inflammation in her foot . He was scared to prescribed her anti inflamatory medication because of her history of colitis. Patient states she finished Budesonide on Sunday. Patient wants to know if she can take Advil or Naproxen or what she can take

## 2019-09-07 ENCOUNTER — Telehealth: Payer: Self-pay | Admitting: Family Medicine

## 2019-09-07 NOTE — Telephone Encounter (Signed)
Patient verbalized understanding  

## 2019-09-07 NOTE — Telephone Encounter (Signed)
Tried to call patient it rang two times and it acted like someone pick up but no one was there.

## 2019-09-07 NOTE — Telephone Encounter (Signed)
Patient calling stating her Podiatrist wants to know if she can take Celebrex

## 2019-09-07 NOTE — Telephone Encounter (Signed)
Pt would like a call from [facility] about a medication / she stated she was not able to find it at the pharmacy /please advise

## 2019-09-07 NOTE — Telephone Encounter (Signed)
Yes, she can take Celebrex for short-term  RV

## 2019-09-07 NOTE — Telephone Encounter (Signed)
Called patient mobile number and she verbalized understanding

## 2019-09-07 NOTE — Telephone Encounter (Signed)
Called and left message.

## 2019-09-14 ENCOUNTER — Ambulatory Visit: Payer: Self-pay

## 2019-09-14 NOTE — Telephone Encounter (Signed)
Pt. she has had a headache since Monday. Forehead and top of head. Pain at eyes and bridge of nose. Taking Celebrex and is only taking Tylenol. Appointment made for Friday(first available). Has sinus drainage, clear. No fever. Would like to know what else she can take for relief. Please advise pt.  Reason for Disposition . [1] MODERATE headache (e.g., interferes with normal activities) AND [2] present > 24 hours AND [3] unexplained  (Exceptions: analgesics not tried, typical migraine, or headache part of viral illness)  Answer Assessment - Initial Assessment Questions 1. LOCATION: "Where does it hurt?"      Forehead and top of head 2. ONSET: "When did the headache start?" (Minutes, hours or days)      Monday 3. PATTERN: "Does the pain come and go, or has it been constant since it started?"     Constant 4. SEVERITY: "How bad is the pain?" and "What does it keep you from doing?"  (e.g., Scale 1-10; mild, moderate, or severe)   - MILD (1-3): doesn't interfere with normal activities    - MODERATE (4-7): interferes with normal activities or awakens from sleep    - SEVERE (8-10): excruciating pain, unable to do any normal activities        4 5. RECURRENT SYMPTOM: "Have you ever had headaches before?" If Yes, ask: "When was the last time?" and "What happened that time?"      No 6. CAUSE: "What do you think is causing the headache?"     Unsure 7. MIGRAINE: "Have you been diagnosed with migraine headaches?" If Yes, ask: "Is this headache similar?"      No 8. HEAD INJURY: "Has there been any recent injury to the head?"      No 9. OTHER SYMPTOMS: "Do you have any other symptoms?" (fever, stiff neck, eye pain, sore throat, cold symptoms)     No 10. PREGNANCY: "Is there any chance you are pregnant?" "When was your last menstrual period?"       No  Protocols used: HEADACHE-A-AH

## 2019-09-14 NOTE — Telephone Encounter (Signed)
You can advise her to try Sudafed if you want. Otherwise we will see Friday at scheduled appt

## 2019-09-16 ENCOUNTER — Encounter: Payer: Self-pay | Admitting: Family Medicine

## 2019-09-16 ENCOUNTER — Other Ambulatory Visit: Payer: Self-pay

## 2019-09-16 ENCOUNTER — Ambulatory Visit (INDEPENDENT_AMBULATORY_CARE_PROVIDER_SITE_OTHER): Payer: Medicare Other | Admitting: Family Medicine

## 2019-09-16 VITALS — BP 124/60 | HR 76 | Ht 67.0 in | Wt 149.0 lb

## 2019-09-16 DIAGNOSIS — G4452 New daily persistent headache (NDPH): Secondary | ICD-10-CM

## 2019-09-16 MED ORDER — AZITHROMYCIN 250 MG PO TABS: ORAL_TABLET | ORAL | 0 refills | Status: DC

## 2019-09-16 NOTE — Progress Notes (Signed)
Date:  09/16/2019   Name:  Caroline Harris   DOB:  1945-08-22   MRN:  [id number]   Chief Complaint: Headache (headache x 9 days, body aches. )  Headache  This is a new problem. The current episode started in the past 7 days (since Monday). The problem occurs constantly. The problem has been waxing and waning. The pain is located in the retro-orbital, bilateral and frontal region. The pain does not radiate. The pain quality is not similar to prior headaches. The quality of the pain is described as aching (pressure). The pain is at a severity of 4/10. The pain is moderate. Associated symptoms include dizziness, nausea and sinus pressure. Pertinent negatives include no abdominal pain, back pain, blurred vision, coughing, drainage, ear pain, eye pain, eye redness, eye watering, facial sweating, fever, numbness, phonophobia, photophobia, rhinorrhea, scalp tenderness, sore throat, tingling, visual change, vomiting or weakness. She has tried acetaminophen and NSAIDs for the symptoms. The treatment provided mild relief. There is no history of hypertension or recent head traumas.    Lab Results  Component Value Date   CREATININE 0.69 08/16/2019   BUN 15 08/16/2019   NA 142 08/16/2019   K 4.8 08/16/2019   CL 103 08/16/2019   CO2 26 08/16/2019   Lab Results  Component Value Date   CHOL 188 08/16/2019   HDL 85 08/16/2019   LDLCALC 86 08/16/2019   TRIG 97 08/16/2019   CHOLHDL 1.7 11/05/2018   Lab Results  Component Value Date   TSH 1.120 06/01/2017   No results found for: HGBA1C Lab Results  Component Value Date   WBC 9.9 08/04/2019   HGB 14.1 08/04/2019   HCT 39.8 08/04/2019   MCV 92 08/04/2019   PLT 257 08/04/2019   Lab Results  Component Value Date   ALT 15 08/16/2019   AST 21 08/16/2019   ALKPHOS 34 (L) 08/16/2019   BILITOT 0.3 08/16/2019     Review of Systems  Constitutional: Negative.  Negative for chills, fatigue, fever and unexpected weight change.  HENT: Positive for  sinus pressure. Negative for congestion, ear discharge, ear pain, rhinorrhea, sneezing and sore throat.   Eyes: Negative for blurred vision, photophobia, pain, discharge, redness and itching.  Respiratory: Negative for cough, shortness of breath, wheezing and stridor.   Gastrointestinal: Positive for nausea. Negative for abdominal pain, blood in stool, constipation, diarrhea and vomiting.  Endocrine: Negative for cold intolerance, heat intolerance, polydipsia, polyphagia and polyuria.  Genitourinary: Negative for dysuria, flank pain, frequency, hematuria, menstrual problem, pelvic pain, urgency, vaginal bleeding and vaginal discharge.  Musculoskeletal: Negative for arthralgias, back pain and myalgias.  Skin: Negative for rash.  Allergic/Immunologic: Negative for environmental allergies and food allergies.  Neurological: Positive for dizziness and headaches. Negative for tingling, weakness, light-headedness and numbness.  Hematological: Negative for adenopathy. Does not bruise/bleed easily.  Psychiatric/Behavioral: Negative for dysphoric mood. The patient is not nervous/anxious.     Patient Active Problem List   Diagnosis Date Noted  . Aortic atherosclerosis ([place]) 11/05/2018  . COPD, moderate ([place]) 08/23/2015  . Asthma, mild intermittent 06/07/2015  . Hyperlipidemia 06/07/2015    No Known Allergies  Past Surgical History:  Procedure Laterality Date  . ABDOMINAL HYSTERECTOMY    . COLONOSCOPY    . TONSILLECTOMY      Social History   Tobacco Use  . Smoking status: Current Some Day Smoker    Packs/day: 0.50    Years: 50.00    Pack years: 25.00  Types: Cigarettes    Start date: 02/17/1958  . Smokeless tobacco: Never Used  . Tobacco comment: aware needs to stop   Vaping Use  . Vaping Use: Never used  Substance Use Topics  . Alcohol use: Yes    Alcohol/week: 1.0 standard drink    Types: 1 Glasses of wine per week  . Drug use: No     Medication list has been reviewed and  updated.  Current Meds  Medication Sig  . aspirin 81 MG tablet Take 81 mg by mouth daily.  . calcium carbonate 1250 MG capsule Take 1,250 mg by mouth 2 (two) times daily with a meal.  . celecoxib (CELEBREX) 200 MG capsule Take 1 capsule by mouth daily.  Mar[place] Kitchen lovastatin (MEVACOR) 20 MG tablet Take 1 tablet (20 mg total) by mouth daily.  . Melatonin 10 MG TABS Take 1 tablet by mouth at bedtime.  . montelukast (SINGULAIR) 10 MG tablet TAKE 1 TABLET(10 MG) BY MOUTH DAILY  . Multiple Vitamins-Minerals (CENTRUM SILVER ADULT 50+ PO) Take 1 capsule by mouth daily at 6 (six) AM.    PHQ 2/9 Scores 09/16/2019 07/04/2019 05/11/2019 05/11/2019  PHQ - 2 Score 0 0 0 0  PHQ- 9 Score 0 0 - -    GAD 7 : Generalized Anxiety Score 09/16/2019 07/04/2019  Nervous, Anxious, on Edge 0 0  Control/stop worrying 0 0  Worry too much - different things 0 0  Trouble relaxing 0 0  Restless 0 0  Easily annoyed or irritable 0 0  Afraid - awful might happen 0 0  Total GAD 7 Score 0 0    BP Readings from Last 3 Encounters:  09/16/19 (!) 124/60  08/16/19 130/80  08/04/19 (!) 148/82    Physical Exam Vitals and nursing note reviewed.  Constitutional:      Appearance: She is well-developed.  HENT:     Head: Normocephalic.     Jaw: There is normal jaw occlusion.     Right Ear: Hearing, tympanic membrane, ear canal and external ear normal. No drainage.     Left Ear: Hearing, tympanic membrane, ear canal and external ear normal. No drainage.     Mouth/Throat:     Mouth: Mucous membranes are moist.  Eyes:     General: Lids are everted, no foreign bodies appreciated. No scleral icterus.       Left eye: No foreign body or hordeolum.     Extraocular Movements: Extraocular movements intact.     Right eye: No nystagmus.     Left eye: No nystagmus.     Conjunctiva/sclera: Conjunctivae normal.     Right eye: Right conjunctiva is not injected.     Left eye: Left conjunctiva is not injected.     Pupils: Pupils are equal,  round, and reactive to light.  Neck:     Thyroid: No thyromegaly.     Vascular: No JVD.     Trachea: No tracheal deviation.  Cardiovascular:     Rate and Rhythm: Normal rate and regular rhythm.     Heart sounds: Normal heart sounds and S1 normal. No murmur heard.  No systolic murmur is present.  No diastolic murmur is present.  No friction rub. No gallop. No S3 or S4 sounds.   Pulmonary:     Effort: Pulmonary effort is normal. No respiratory distress.     Breath sounds: Normal breath sounds. No wheezing or rales.  Abdominal:     General: Bowel sounds are normal.  Palpations: Abdomen is soft. There is no mass.     Tenderness: There is no abdominal tenderness. There is no guarding or rebound.  Musculoskeletal:        General: No tenderness. Normal range of motion.     Cervical back: Normal range of motion and neck supple.     Right lower leg: No edema.     Left lower leg: No edema.  Lymphadenopathy:     Cervical: No cervical adenopathy.  Skin:    General: Skin is warm.     Findings: No rash.  Neurological:     Mental Status: She is alert and oriented to person, place, and time.     Cranial Nerves: Cranial nerves are intact. No cranial nerve deficit.     Sensory: Sensation is intact.     Motor: Motor function is intact. No atrophy or abnormal muscle tone.     Coordination: Coordination is intact.     Deep Tendon Reflexes: Reflexes normal.     Reflex Scores:      Tricep reflexes are 2+ on the right side and 2+ on the left side.      Bicep reflexes are 2+ on the right side and 2+ on the left side.      Brachioradialis reflexes are 2+ on the right side and 2+ on the left side.      Patellar reflexes are 2+ on the right side and 2+ on the left side.      Achilles reflexes are 2+ on the right side and 2+ on the left side. Psychiatric:        Mood and Affect: Mood is not anxious or depressed.     Wt Readings from Last 3 Encounters:  09/16/19 149 lb (67.6 kg)  08/16/19 150 lb  (68 kg)  08/04/19 150 lb 6 oz (68.2 kg)    BP (!) 124/60   Pulse 76   Ht 5\' 7"  (1.702 m)   Wt 149 lb (67.6 kg)   BMI 23.34 kg/m   Assessment and Plan: 1. New daily persistent headache New onset.  Daily.  Persistent.  Low-grade in the frontal and retro-orbital aspects.  Review of previous encounters for headache several years ago but that was more of an occipital headache which turned out to be okay patient had a normal neurologic exam with no concerns noted.  We will treat this as if patient has a sinus/sinusitis circumstance with Afrin to decompress sinuses and azithromycin with the possibility of infection.  If headache continues or changes in nature patient is to go either to the ER/urgent care or contact us on Monday if is not of a serious nature. - azithromycin (ZITHROMAX) 250 MG tablet; 2 tablets today then 1 a day for four days  Dispense: 6 tablet; Refill: 0

## 2019-09-18 ENCOUNTER — Encounter: Payer: Self-pay | Admitting: Gastroenterology

## 2019-10-09 ENCOUNTER — Encounter: Payer: Self-pay | Admitting: Emergency Medicine

## 2019-10-09 ENCOUNTER — Ambulatory Visit
Admission: EM | Admit: 2019-10-09 | Discharge: 2019-10-09 | Disposition: A | Discharge status: Home / Self Care | Payer: Medicare Other | Attending: Family Medicine | Admitting: Family Medicine

## 2019-10-09 ENCOUNTER — Other Ambulatory Visit: Payer: Self-pay

## 2019-10-09 DIAGNOSIS — R05 Cough: Secondary | ICD-10-CM | POA: Diagnosis not present

## 2019-10-09 DIAGNOSIS — F17210 Nicotine dependence, cigarettes, uncomplicated: Secondary | ICD-10-CM | POA: Diagnosis not present

## 2019-10-09 DIAGNOSIS — G2581 Restless legs syndrome: Secondary | ICD-10-CM | POA: Insufficient documentation

## 2019-10-09 DIAGNOSIS — Z791 Long term (current) use of non-steroidal anti-inflammatories (NSAID): Secondary | ICD-10-CM | POA: Insufficient documentation

## 2019-10-09 DIAGNOSIS — Z7952 Long term (current) use of systemic steroids: Secondary | ICD-10-CM | POA: Insufficient documentation

## 2019-10-09 DIAGNOSIS — Z7982 Long term (current) use of aspirin: Secondary | ICD-10-CM | POA: Diagnosis not present

## 2019-10-09 DIAGNOSIS — Z79899 Other long term (current) drug therapy: Secondary | ICD-10-CM | POA: Diagnosis not present

## 2019-10-09 DIAGNOSIS — J449 Chronic obstructive pulmonary disease, unspecified: Secondary | ICD-10-CM | POA: Diagnosis not present

## 2019-10-09 DIAGNOSIS — J4520 Mild intermittent asthma, uncomplicated: Secondary | ICD-10-CM | POA: Insufficient documentation

## 2019-10-09 DIAGNOSIS — J069 Acute upper respiratory infection, unspecified: Secondary | ICD-10-CM | POA: Insufficient documentation

## 2019-10-09 DIAGNOSIS — Z20822 Contact with and (suspected) exposure to covid-19: Secondary | ICD-10-CM | POA: Diagnosis not present

## 2019-10-09 DIAGNOSIS — M8580 Other specified disorders of bone density and structure, unspecified site: Secondary | ICD-10-CM | POA: Insufficient documentation

## 2019-10-09 DIAGNOSIS — E785 Hyperlipidemia, unspecified: Secondary | ICD-10-CM | POA: Diagnosis not present

## 2019-10-09 DIAGNOSIS — I700 Atherosclerosis of aorta: Secondary | ICD-10-CM | POA: Insufficient documentation

## 2019-10-09 DIAGNOSIS — R0981 Nasal congestion: Secondary | ICD-10-CM | POA: Diagnosis not present

## 2019-10-09 DIAGNOSIS — R059 Cough, unspecified: Secondary | ICD-10-CM

## 2019-10-09 MED ORDER — FLUTICASONE PROPIONATE 50 MCG/ACT NA SUSP
1.0000 | Freq: Two times a day (BID) | NASAL | 0 refills | Status: DC
Start: 2019-10-09 — End: 2020-02-23

## 2019-10-09 MED ORDER — ALBUTEROL SULFATE HFA 108 (90 BASE) MCG/ACT IN AERS: 1.0000 | INHALATION_SPRAY | Freq: Four times a day (QID) | RESPIRATORY_TRACT | 0 refills | Status: DC | PRN

## 2019-10-09 NOTE — Discharge Instructions (Signed)
URI/COLD SYMPTOMS: Your exam today is consistent with a viral illness. Antibiotics are not indicated at this time. Use medications as directed, including Mucinex, nasal saline, and flonase. Your symptoms should improve over the next few days and resolve within 7-10 days. Increase rest and fluids. F/u if symptoms worsen or predominate such as sore throat, ear pain, productive cough, shortness of breath, or if you develop high fevers or worsening fatigue over the next several days.    You have received COVID testing today either for positive exposure, concerning symptoms that could be related to COVID infection, screening purposes, or re-testing after confirmed positive.  Your test obtained today checks for active viral infection in the last 1-2 weeks. If your test is negative now, you can still test positive later. So, if you do develop symptoms you should either get re-tested and/or isolate x 10 days. Please follow CDC guidelines.  While Rapid antigen tests come back in 15-20 minutes, send out PCR/molecular test results typically come back within 24 hours. In the mean time, if you are symptomatic, assume this could be a positive test and treat/monitor yourself as if you do have COVID.   We will call with test results. Please download the MyChart app and set up a profile to access test results.   If symptomatic, go home and rest. Push fluids. Take Tylenol as needed for discomfort. Gargle warm salt water. Throat lozenges. Take Mucinex DM or Robitussin for cough. Humidifier in bedroom to ease coughing. Warm showers. Also review the COVID handout for more information.  COVID-19 INFECTION: The incubation period of COVID-19 is approximately 14 days after exposure, with most symptoms developing in roughly 4-5 days. Symptoms may range in severity from mild to critically severe. Roughly 80% of those infected will have mild symptoms. People of any age may become infected with COVID-19 and have the ability to  transmit the virus. The most common symptoms include: fever, fatigue, cough, body aches, headaches, sore throat, nasal congestion, shortness of breath, nausea, vomiting, diarrhea, changes in smell and/or taste.    COURSE OF ILLNESS Some patients may begin with mild disease which can progress quickly into critical symptoms. If your symptoms are worsening please call ahead to the Emergency Department and proceed there for further treatment. Recovery time appears to be roughly 1-2 weeks for mild symptoms and 3-6 weeks for severe disease.   GO IMMEDIATELY TO ER FOR FEVER YOU ARE UNABLE TO GET DOWN WITH TYLENOL, BREATHING PROBLEMS, CHEST PAIN, FATIGUE, LETHARGY, INABILITY TO EAT OR DRINK, ETC  QUARANTINE AND ISOLATION: To help decrease the spread of COVID-19 please remain isolated if you have COVID infection or are highly suspected to have COVID infection. This means -stay home and isolate to one room in the home if you live with others. Do not share a bed or bathroom with others while ill, sanitize and wipe down all countertops and keep common areas clean and disinfected. You may discontinue isolation if you have a mild case and are asymptomatic 10 days after symptom onset as long as you have been fever free >24 hours without having to take Motrin or Tylenol. If your case is more severe (meaning you develop pneumonia or are admitted in the hospital), you may have to isolate longer.   If you have been in close contact (within 6 feet) of someone diagnosed with COVID 19, you are advised to quarantine in your home for 14 days as symptoms can develop anywhere from 2-14 days after exposure to the virus.  If you develop symptoms, you  must isolate.  Most current guidelines for COVID after exposure -isolate 10 days if you ARE NOT tested for COVID as long as symptoms do not develop -isolate 7 days if you are tested and remain asymptomatic -You do not necessarily need to be tested for COVID if you have + exposure and         develop   symptoms. Just isolate at home x10 days from symptom onset During this global pandemic, CDC advises to practice social distancing, try to stay at least 58ft away from others at all times. Wear a face covering. Wash and sanitize your hands regularly and avoid going anywhere that is not necessary.  KEEP IN MIND THAT THE COVID TEST IS NOT 100% ACCURATE AND YOU SHOULD STILL DO EVERYTHING TO PREVENT POTENTIAL SPREAD OF VIRUS TO OTHERS (WEAR MASK, WEAR GLOVES, [place] HANDS AND SANITIZE REGULARLY). IF INITIAL TEST IS NEGATIVE, THIS MAY NOT MEAN YOU ARE DEFINITELY NEGATIVE. MOST ACCURATE TESTING IS DONE 5-7 DAYS AFTER EXPOSURE.   It is not advised by CDC to get re-tested after receiving a positive COVID test since you can still test positive for weeks to months after you have already cleared the virus.   *If you have not been vaccinated for COVID, I strongly suggest you consider getting vaccinated as long as there are no contraindications.

## 2019-10-09 NOTE — ED Provider Notes (Signed)
[facility]    CSN: [id number] Arrival date & time: 10/09/19  2633      History   Chief Complaint Chief Complaint  Patient presents with  . Cough  . Nasal Congestion  . sinus pressure    HPI Caroline BLUNCK is a 74 y.o. female.   74 y/o female presents for 5 day history of occasionally productive cough and sinus congestion/pain. She denies fever, chest pain, SOB, headaches, abdominal pain, n/v. She says she has tried multiple OTC medications including Sudafed, nasal saline, and Afrin. Patient denies known COVID exposure and fully vaccinated. Patient has history of collagenous colitis and takes budesonide for this. She also has history of mild COPD. No other concerns today.     Past Medical History:  Diagnosis Date  . Collagenous colitis   . Diverticulitis   . Hyperlipidemia   . Osteopenia   . Restless leg     Patient Active Problem List   Diagnosis Date Noted  . Aortic atherosclerosis ([place]) 11/05/2018  . COPD, moderate ([place]) 08/23/2015  . Asthma, mild intermittent 06/07/2015  . Hyperlipidemia 06/07/2015    Past Surgical History:  Procedure Laterality Date  . ABDOMINAL HYSTERECTOMY    . COLONOSCOPY    . TONSILLECTOMY      OB History   No obstetric history on file.      Home Medications    Prior to Admission medications   Medication Sig Start Date End Date Taking? Authorizing Provider  aspirin 81 MG tablet Take 81 mg by mouth daily.   Yes [provider]  budesonide (ENTOCORT EC) 3 MG 24 hr capsule Take 1 capsule by mouth in the morning. GI 06/17/19 06/16/20 Yes [provider]  calcium carbonate 1250 MG capsule Take 1,250 mg by mouth 2 (two) times daily with a meal.   Yes [provider]  lovastatin (MEVACOR) 20 MG tablet Take 1 tablet (20 mg total) by mouth daily. 08/16/19  Yes Juline Patch, MD  Melatonin 10 MG TABS Take 1 tablet by mouth at bedtime.   Yes [provider]  montelukast (SINGULAIR) 10 MG  tablet TAKE 1 TABLET(10 MG) BY MOUTH DAILY 08/16/19  Yes Juline Patch, MD  Multiple Vitamins-Minerals (CENTRUM SILVER ADULT 50+ PO) Take 1 capsule by mouth daily at 6 (six) AM.   Yes [provider]  albuterol (VENTOLIN HFA) 108 (90 Base) MCG/ACT inhaler Inhale 1-2 puffs into the lungs every 6 (six) hours as needed for wheezing or shortness of breath. 10/09/19 11/08/19  Laurene Footman B, PA-C  azithromycin (ZITHROMAX) 250 MG tablet 2 tablets today then 1 a day for four days 09/16/19   Juline Patch, MD  celecoxib (CELEBREX) 200 MG capsule Take 1 capsule by mouth daily. 09/06/19   [provider]  fluticasone (FLONASE) 50 MCG/ACT nasal spray Place 1 spray into both nostrils in the morning and at bedtime for 10 days. 10/09/19 10/19/19  Danton Clap, PA-C    Family History Family History  Problem Relation Age of Onset  . Breast cancer Mother 57  . Heart attack Father     Social History Social History   Tobacco Use  . Smoking status: Current Every Day Smoker    Packs/day: 0.25    Years: 50.00    Pack years: 12.50    Types: Cigarettes    Start date: 02/17/1958  . Smokeless tobacco: Never Used  . Tobacco comment: aware needs to stop   Vaping Use  . Vaping  Use: Never used  Substance Use Topics  . Alcohol use: Yes    Alcohol/week: 1.0 standard drink    Types: 1 Glasses of wine per week  . Drug use: No     Allergies   Patient has no known allergies.   Review of Systems Review of Systems  Constitutional: Negative for chills, diaphoresis, fatigue and fever.  HENT: Positive for congestion, postnasal drip, rhinorrhea, sinus pressure and sinus pain. Negative for ear pain and sore throat.   Respiratory: Positive for cough. Negative for shortness of breath and wheezing.   Gastrointestinal: Positive for diarrhea. Negative for abdominal pain, nausea and vomiting.  Musculoskeletal: Negative for arthralgias and myalgias.  Skin: Negative for rash.  Neurological: Positive  for headaches. Negative for weakness.  Hematological: Negative for adenopathy.     Physical Exam Triage Vital Signs ED Triage Vitals [10/09/19 0944]  Enc Vitals Group     BP (!) 149/68     Pulse Rate (!) 56     Resp 18     Temp 98.2 F (36.8 C)     Temp Source Oral     SpO2 99 %     Weight 150 lb (68 kg)     Height 5\' 7"  (1.702 m)     Head Circumference      Peak Flow      Pain Score 4     Pain Loc      Pain Edu?      Excl. in [place]?    No data found.  Updated Vital Signs BP (!) 149/68 (BP Location: Left Arm)   Pulse (!) 56   Temp 98.2 F (36.8 C) (Oral)   Resp 18   Ht 5\' 7"  (1.702 m)   Wt 150 lb (68 kg)   SpO2 99%   BMI 23.49 kg/m       Physical Exam Vitals and nursing note reviewed.  Constitutional:      General: She is not in acute distress.    Appearance: Normal appearance. She is not ill-appearing or toxic-appearing.  HENT:     Head: Normocephalic and atraumatic.     Right Ear: Tympanic membrane, ear canal and external ear normal.     Left Ear: Tympanic membrane, ear canal and external ear normal.     Nose: Rhinorrhea present.     Right Sinus: No maxillary sinus tenderness or frontal sinus tenderness.     Left Sinus: No maxillary sinus tenderness or frontal sinus tenderness.     Mouth/Throat:     Mouth: Mucous membranes are moist.     Pharynx: Oropharynx is clear. Posterior oropharyngeal erythema (mild, clear PND) present.  Eyes:     General: No scleral icterus.       Right eye: No discharge.        Left eye: No discharge.     Conjunctiva/sclera: Conjunctivae normal.  Cardiovascular:     Rate and Rhythm: Regular rhythm. Bradycardia present.     Heart sounds: Normal heart sounds.  Pulmonary:     Effort: Pulmonary effort is normal. No respiratory distress.     Breath sounds: Normal breath sounds. No wheezing, rhonchi or rales.  Musculoskeletal:     Cervical back: Neck supple.  Skin:    General: Skin is dry.  Neurological:     General: No focal  deficit present.     Mental Status: She is alert. Mental status is at baseline.     Motor: No weakness.     Gait:  Gait normal.  Psychiatric:        Mood and Affect: Mood normal.        Behavior: Behavior normal.        Thought Content: Thought content normal.      UC Treatments / Results  Labs (all labs ordered are listed, but only abnormal results are displayed) Labs Reviewed  SARS CORONAVIRUS 2 (TAT 6-24 HRS)    EKG   Radiology No results found.  Procedures Procedures (including critical care time)  Medications Ordered in UC Medications - No data to display  Initial Impression / Assessment and Plan / UC Course  I have reviewed the triage vital signs and the nursing notes.  Pertinent labs & imaging results that were available during my care of the patient were reviewed by me and considered in my medical decision making (see chart for details).    74 y/o female with sinus congestion and cough x 5 days, no worsening or improvement since onset. Not associated with fever, fatigue, chest pain, SOB. On exam, chest CTA. Suspect viral URI vs COVID. COVID testing obtained. Isolate until results and follow CDC guidelines if positive. Patient well appearing and suitable for at home care. Advised switching to Mucinex and Flonase, rest, fluids. Sent fluticasone and refilled her asthma inhaler in case she needs it at some point. Follow up as needed. ED precautions discussed.  Patient declined AVS today.  Final Clinical Impressions(s) / UC Diagnoses   Final diagnoses:  Viral upper respiratory tract infection  Cough  Nasal congestion  Chronic obstructive pulmonary disease, unspecified COPD type [facility])     Discharge Instructions     URI/COLD SYMPTOMS: Your exam today is consistent with a viral illness. Antibiotics are not indicated at this time. Use medications as directed, including Mucinex, nasal saline, and flonase. Your symptoms should improve over the next few days and  resolve within 7-10 days. Increase rest and fluids. F/u if symptoms worsen or predominate such as sore throat, ear pain, productive cough, shortness of breath, or if you develop high fevers or worsening fatigue over the next several days.    You have received COVID testing today either for positive exposure, concerning symptoms that could be related to COVID infection, screening purposes, or re-testing after confirmed positive.  Your test obtained today checks for active viral infection in the last 1-2 weeks. If your test is negative now, you can still test positive later. So, if you do develop symptoms you should either get re-tested and/or isolate x 10 days. Please follow CDC guidelines.  While Rapid antigen tests come back in 15-20 minutes, send out PCR/molecular test results typically come back within 24 hours. In the mean time, if you are symptomatic, assume this could be a positive test and treat/monitor yourself as if you do have COVID.   We will call with test results. Please download the MyChart app and set up a profile to access test results.   If symptomatic, go home and rest. Push fluids. Take Tylenol as needed for discomfort. Gargle warm salt water. Throat lozenges. Take Mucinex DM or Robitussin for cough. Humidifier in bedroom to ease coughing. Warm showers. Also review the COVID handout for more information.  COVID-19 INFECTION: The incubation period of COVID-19 is approximately 14 days after exposure, with most symptoms developing in roughly 4-5 days. Symptoms may range in severity from mild to critically severe. Roughly 80% of those infected will have mild symptoms. People of any age may become infected with COVID-16 and  have the ability to transmit the virus. The most common symptoms include: fever, fatigue, cough, body aches, headaches, sore throat, nasal congestion, shortness of breath, nausea, vomiting, diarrhea, changes in smell and/or taste.    COURSE OF ILLNESS Some patients may  begin with mild disease which can progress quickly into critical symptoms. If your symptoms are worsening please call ahead to the Emergency Department and proceed there for further treatment. Recovery time appears to be roughly 1-2 weeks for mild symptoms and 3-6 weeks for severe disease.   GO IMMEDIATELY TO ER FOR FEVER YOU ARE UNABLE TO GET DOWN WITH TYLENOL, BREATHING PROBLEMS, CHEST PAIN, FATIGUE, LETHARGY, INABILITY TO EAT OR DRINK, ETC  QUARANTINE AND ISOLATION: To help decrease the spread of COVID-19 please remain isolated if you have COVID infection or are highly suspected to have COVID infection. This means -stay home and isolate to one room in the home if you live with others. Do not share a bed or bathroom with others while ill, sanitize and wipe down all countertops and keep common areas clean and disinfected. You may discontinue isolation if you have a mild case and are asymptomatic 10 days after symptom onset as long as you have been fever free >24 hours without having to take Motrin or Tylenol. If your case is more severe (meaning you develop pneumonia or are admitted in the hospital), you may have to isolate longer.   If you have been in close contact (within 6 feet) of someone diagnosed with COVID 19, you are advised to quarantine in your home for 14 days as symptoms can develop anywhere from 2-14 days after exposure to the virus. If you develop symptoms, you  must isolate.  Most current guidelines for COVID after exposure -isolate 10 days if you ARE NOT tested for COVID as long as symptoms do not develop -isolate 7 days if you are tested and remain asymptomatic -You do not necessarily need to be tested for COVID if you have + exposure and        develop   symptoms. Just isolate at home x10 days from symptom onset During this global pandemic, CDC advises to practice social distancing, try to stay at least 31ft away from others at all times. Wear a face covering. Wash and sanitize your  hands regularly and avoid going anywhere that is not necessary.  KEEP IN MIND THAT THE COVID TEST IS NOT 100% ACCURATE AND YOU SHOULD STILL DO EVERYTHING TO PREVENT POTENTIAL SPREAD OF VIRUS TO OTHERS (WEAR MASK, WEAR GLOVES, [place] HANDS AND SANITIZE REGULARLY). IF INITIAL TEST IS NEGATIVE, THIS MAY NOT MEAN YOU ARE DEFINITELY NEGATIVE. MOST ACCURATE TESTING IS DONE 5-7 DAYS AFTER EXPOSURE.   It is not advised by CDC to get re-tested after receiving a positive COVID test since you can still test positive for weeks to months after you have already cleared the virus.   *If you have not been vaccinated for COVID, I strongly suggest you consider getting vaccinated as long as there are no contraindications.      ED Prescriptions    Medication Sig Dispense Auth. Provider   fluticasone (FLONASE) 50 MCG/ACT nasal spray Place 1 spray into both nostrils in the morning and at bedtime for 10 days. 1 g Laurene Footman B, PA-C   albuterol (VENTOLIN HFA) 108 (90 Base) MCG/ACT inhaler Inhale 1-2 puffs into the lungs every 6 (six) hours as needed for wheezing or shortness of breath. 6.7 g Danton Clap, PA-C  PDMP not reviewed this encounter.   Danton Clap, PA-C 10/09/19 1027

## 2019-10-09 NOTE — ED Triage Notes (Signed)
Patient in today c/o cough, nasal congestion and sinus pressure x 5 days. Patient denies fever. Patient has complete the covid vaccine. Patient has tried OTC Sudafed, saline nasal spray,Tylenol, Delsym and Afrin.

## 2019-10-10 LAB — SARS CORONAVIRUS 2 (TAT 6-24 HRS): SARS Coronavirus 2: NEGATIVE

## 2019-10-12 ENCOUNTER — Other Ambulatory Visit: Payer: Self-pay

## 2019-10-13 ENCOUNTER — Encounter: Payer: Self-pay | Admitting: Family Medicine

## 2019-10-13 ENCOUNTER — Other Ambulatory Visit: Payer: Self-pay

## 2019-10-13 ENCOUNTER — Ambulatory Visit (INDEPENDENT_AMBULATORY_CARE_PROVIDER_SITE_OTHER): Payer: Medicare Other | Admitting: Family Medicine

## 2019-10-13 VITALS — BP 120/72 | HR 64 | Temp 98.3°F | Ht 67.0 in | Wt 152.0 lb

## 2019-10-13 DIAGNOSIS — J449 Chronic obstructive pulmonary disease, unspecified: Secondary | ICD-10-CM | POA: Diagnosis not present

## 2019-10-13 DIAGNOSIS — J0100 Acute maxillary sinusitis, unspecified: Secondary | ICD-10-CM | POA: Diagnosis not present

## 2019-10-13 MED ORDER — AZITHROMYCIN 250 MG PO TABS: ORAL_TABLET | ORAL | 0 refills | Status: DC

## 2019-10-13 MED ORDER — GUAIFENESIN-CODEINE 100-10 MG/5ML PO SYRP: 5.0000 mL | ORAL_SOLUTION | Freq: Four times a day (QID) | ORAL | 0 refills | Status: DC | PRN

## 2019-10-13 NOTE — Progress Notes (Signed)
Date:  10/13/2019   Name:  Caroline Harris   DOB:  1946/02/17   MRN:  [id number]   Chief Complaint: Sinusitis (was seen 4 days ago at urgent care, covid- negative. cough- can't get anything up)  Sinusitis This is a recurrent problem. The current episode started in the past 7 days. The problem is unchanged. There has been no fever. Associated symptoms include congestion, coughing, headaches, sinus pressure and sneezing. Pertinent negatives include no chills, diaphoresis, ear pain, hoarse voice, neck pain, shortness of breath, sore throat or swollen glands. Past treatments include oral decongestants (flonase/albuterol). The treatment provided no relief.    Lab Results  Component Value Date   CREATININE 0.69 08/16/2019   BUN 15 08/16/2019   NA 142 08/16/2019   K 4.8 08/16/2019   CL 103 08/16/2019   CO2 26 08/16/2019   Lab Results  Component Value Date   CHOL 188 08/16/2019   HDL 85 08/16/2019   LDLCALC 86 08/16/2019   TRIG 97 08/16/2019   CHOLHDL 1.7 11/05/2018   Lab Results  Component Value Date   TSH 1.120 06/01/2017   No results found for: HGBA1C Lab Results  Component Value Date   WBC 9.9 08/04/2019   HGB 14.1 08/04/2019   HCT 39.8 08/04/2019   MCV 92 08/04/2019   PLT 257 08/04/2019   Lab Results  Component Value Date   ALT 15 08/16/2019   AST 21 08/16/2019   ALKPHOS 34 (L) 08/16/2019   BILITOT 0.3 08/16/2019     Review of Systems  Constitutional: Negative.  Negative for chills, diaphoresis, fatigue, fever and unexpected weight change.  HENT: Positive for congestion, sinus pressure and sneezing. Negative for ear discharge, ear pain, facial swelling, hearing loss, hoarse voice, mouth sores, nosebleeds, postnasal drip, rhinorrhea and sore throat.   Eyes: Negative for photophobia, pain, discharge, redness and itching.  Respiratory: Positive for cough. Negative for shortness of breath, wheezing and stridor.   Cardiovascular: Negative for chest pain, palpitations  and leg swelling.  Gastrointestinal: Negative for abdominal pain, blood in stool, constipation, diarrhea, nausea and vomiting.  Endocrine: Negative for cold intolerance, heat intolerance, polydipsia, polyphagia and polyuria.  Genitourinary: Negative for dysuria, flank pain, frequency, hematuria, menstrual problem, pelvic pain, urgency, vaginal bleeding and vaginal discharge.  Musculoskeletal: Negative for arthralgias, back pain, myalgias and neck pain.  Skin: Negative for rash.  Allergic/Immunologic: Negative for environmental allergies and food allergies.  Neurological: Positive for headaches. Negative for dizziness, weakness, light-headedness and numbness.  Hematological: Negative for adenopathy. Does not bruise/bleed easily.  Psychiatric/Behavioral: Negative for dysphoric mood. The patient is not nervous/anxious.     Patient Active Problem List   Diagnosis Date Noted  . Aortic atherosclerosis ([place]) 11/05/2018  . COPD, moderate ([place]) 08/23/2015  . Asthma, mild intermittent 06/07/2015  . Hyperlipidemia 06/07/2015    No Known Allergies  Past Surgical History:  Procedure Laterality Date  . ABDOMINAL HYSTERECTOMY    . COLONOSCOPY    . TONSILLECTOMY      Social History   Tobacco Use  . Smoking status: Current Every Day Smoker    Packs/day: 0.25    Years: 50.00    Pack years: 12.50    Types: Cigarettes    Start date: 02/17/1958  . Smokeless tobacco: Never Used  . Tobacco comment: aware needs to stop   Vaping Use  . Vaping Use: Never used  Substance Use Topics  . Alcohol use: Yes    Alcohol/week: 1.0 standard drink  Types: 1 Glasses of wine per week  . Drug use: No     Medication list has been reviewed and updated.  Current Meds  Medication Sig  . albuterol (VENTOLIN HFA) 108 (90 Base) MCG/ACT inhaler Inhale 1-2 puffs into the lungs every 6 (six) hours as needed for wheezing or shortness of breath.  Marland Kitchen aspirin 81 MG tablet Take 81 mg by mouth daily.  . budesonide  (ENTOCORT EC) 3 MG 24 hr capsule Take 1 capsule by mouth in the morning. GI  . calcium carbonate 1250 MG capsule Take 1,250 mg by mouth 2 (two) times daily with a meal.  . fluticasone (FLONASE) 50 MCG/ACT nasal spray Place 1 spray into both nostrils in the morning and at bedtime for 10 days.  Marland Kitchen lovastatin (MEVACOR) 20 MG tablet Take 1 tablet (20 mg total) by mouth daily.  . Melatonin 10 MG TABS Take 1 tablet by mouth at bedtime.  . montelukast (SINGULAIR) 10 MG tablet TAKE 1 TABLET(10 MG) BY MOUTH DAILY  . Multiple Vitamins-Minerals (CENTRUM SILVER ADULT 50+ PO) Take 1 capsule by mouth daily at 6 (six) AM.    PHQ 2/9 Scores 09/16/2019 07/04/2019 05/11/2019 05/11/2019  PHQ - 2 Score 0 0 0 0  PHQ- 9 Score 0 0 - -    GAD 7 : Generalized Anxiety Score 09/16/2019 07/04/2019  Nervous, Anxious, on Edge 0 0  Control/stop worrying 0 0  Worry too much - different things 0 0  Trouble relaxing 0 0  Restless 0 0  Easily annoyed or irritable 0 0  Afraid - awful might happen 0 0  Total GAD 7 Score 0 0    BP Readings from Last 3 Encounters:  10/13/19 120/72  10/09/19 (!) 149/68  09/16/19 (!) 124/60    Physical Exam Vitals and nursing note reviewed.  Constitutional:      Appearance: She is well-developed.  HENT:     Head: Normocephalic.     Right Ear: Ear canal and external ear normal. Tympanic membrane is retracted.     Left Ear: Ear canal and external ear normal. Tympanic membrane is retracted.     Nose:     Right Turbinates: Swollen.     Left Turbinates: Swollen.     Right Sinus: Maxillary sinus tenderness present.     Left Sinus: Maxillary sinus tenderness present.     Mouth/Throat:     Mouth: Mucous membranes are moist.     Palate: No mass and lesions.     Pharynx: Oropharynx is clear.  Eyes:     General: Lids are everted, no foreign bodies appreciated. No scleral icterus.       Left eye: No foreign body or hordeolum.     Conjunctiva/sclera: Conjunctivae normal.     Right eye: Right  conjunctiva is not injected.     Left eye: Left conjunctiva is not injected.     Pupils: Pupils are equal, round, and reactive to light.  Neck:     Thyroid: No thyromegaly.     Vascular: No JVD.     Trachea: No tracheal deviation.  Cardiovascular:     Rate and Rhythm: Normal rate and regular rhythm.     Heart sounds: Normal heart sounds. No murmur heard.  No friction rub. No gallop.   Pulmonary:     Effort: Pulmonary effort is normal. No respiratory distress.     Breath sounds: Normal breath sounds. No decreased breath sounds, wheezing, rhonchi or rales.     Comments:  increasedf E/I Abdominal:     General: Bowel sounds are normal.     Palpations: Abdomen is soft. There is no mass.     Tenderness: There is no abdominal tenderness. There is no guarding or rebound.  Musculoskeletal:        General: No tenderness. Normal range of motion.     Cervical back: Normal range of motion and neck supple.  Lymphadenopathy:     Cervical: No cervical adenopathy.  Skin:    General: Skin is warm.     Findings: No rash.  Neurological:     Mental Status: She is alert and oriented to person, place, and time.     Cranial Nerves: No cranial nerve deficit.     Deep Tendon Reflexes: Reflexes normal.  Psychiatric:        Mood and Affect: Mood is not anxious or depressed.     Wt Readings from Last 3 Encounters:  10/13/19 152 lb (68.9 kg)  10/09/19 150 lb (68 kg)  09/16/19 149 lb (67.6 kg)    BP 120/72   Pulse 64   Temp 98.3 F (36.8 C) (Oral)   Ht 5\' 7"  (1.702 m)   Wt 152 lb (68.9 kg)   SpO2 97%   BMI 23.81 kg/m    Assessment and Plan: 1. Acute maxillary sinusitis, recurrence not specified Acute.  Persistent.  Seen in urgent care couple days earlier and prescribed mucolytic agent along with dextromethorphan.  Patient earlier was taking decongestant and I have suggested that be reinstated.  Patient will also continue Flonase nasal spray. - azithromycin (ZITHROMAX) 250 MG tablet; 2 today  then 1 a day for 4 days  Dispense: 6 tablet; Refill: 0  2. COPD, moderate ([place]) Chronic.  Relatively controlled but has an acute exacerbation at this time.  Patient is on albuterol 2 puffs every 6 hours.  Patient has been given a sample of Breo 201 puff every morning for 2 weeks. - guaiFENesin-codeine (ROBITUSSIN AC) 100-10 MG/5ML syrup; Take 5 mLs by mouth 4 (four) times daily as needed for cough.  Dispense: 118 mL; Refill: 0

## 2019-10-17 ENCOUNTER — Encounter: Payer: Self-pay | Admitting: Family Medicine

## 2019-10-20 ENCOUNTER — Ambulatory Visit
Admission: RE | Admit: 2019-10-20 | Discharge: 2019-10-20 | Disposition: A | Discharge status: Home / Self Care | Payer: Medicare Other | Source: Ambulatory Visit | Attending: Family Medicine | Admitting: Family Medicine

## 2019-10-20 ENCOUNTER — Other Ambulatory Visit: Payer: Self-pay

## 2019-10-20 ENCOUNTER — Ambulatory Visit (INDEPENDENT_AMBULATORY_CARE_PROVIDER_SITE_OTHER): Payer: Medicare Other

## 2019-10-20 VITALS — BP 165/77 | HR 65 | Temp 98.5°F | Resp 18 | Ht 67.0 in | Wt 150.0 lb

## 2019-10-20 DIAGNOSIS — Z79899 Other long term (current) drug therapy: Secondary | ICD-10-CM | POA: Diagnosis not present

## 2019-10-20 DIAGNOSIS — Z8709 Personal history of other diseases of the respiratory system: Secondary | ICD-10-CM

## 2019-10-20 DIAGNOSIS — J441 Chronic obstructive pulmonary disease with (acute) exacerbation: Secondary | ICD-10-CM | POA: Insufficient documentation

## 2019-10-20 DIAGNOSIS — M8580 Other specified disorders of bone density and structure, unspecified site: Secondary | ICD-10-CM | POA: Insufficient documentation

## 2019-10-20 DIAGNOSIS — Z7952 Long term (current) use of systemic steroids: Secondary | ICD-10-CM | POA: Diagnosis not present

## 2019-10-20 DIAGNOSIS — Z7982 Long term (current) use of aspirin: Secondary | ICD-10-CM | POA: Diagnosis not present

## 2019-10-20 DIAGNOSIS — R0981 Nasal congestion: Secondary | ICD-10-CM | POA: Diagnosis not present

## 2019-10-20 DIAGNOSIS — R05 Cough: Secondary | ICD-10-CM | POA: Diagnosis not present

## 2019-10-20 DIAGNOSIS — Z20822 Contact with and (suspected) exposure to covid-19: Secondary | ICD-10-CM | POA: Insufficient documentation

## 2019-10-20 DIAGNOSIS — J4520 Mild intermittent asthma, uncomplicated: Secondary | ICD-10-CM | POA: Insufficient documentation

## 2019-10-20 DIAGNOSIS — G2581 Restless legs syndrome: Secondary | ICD-10-CM | POA: Diagnosis not present

## 2019-10-20 DIAGNOSIS — F17210 Nicotine dependence, cigarettes, uncomplicated: Secondary | ICD-10-CM | POA: Insufficient documentation

## 2019-10-20 DIAGNOSIS — I700 Atherosclerosis of aorta: Secondary | ICD-10-CM | POA: Diagnosis not present

## 2019-10-20 DIAGNOSIS — E785 Hyperlipidemia, unspecified: Secondary | ICD-10-CM | POA: Diagnosis not present

## 2019-10-20 DIAGNOSIS — J449 Chronic obstructive pulmonary disease, unspecified: Secondary | ICD-10-CM | POA: Diagnosis not present

## 2019-10-20 LAB — SARS CORONAVIRUS 2 (TAT 6-24 HRS): SARS Coronavirus 2: NEGATIVE

## 2019-10-20 MED ORDER — PREDNISONE 50 MG PO TABS: ORAL_TABLET | ORAL | 0 refills | Status: DC

## 2019-10-20 NOTE — Discharge Instructions (Signed)
Continue the albuterol.  Prednisone as prescribed.  Follow up with PCP.  Take care  Dr. Lacinda Axon

## 2019-10-20 NOTE — ED Triage Notes (Signed)
Patient states she was here on 08/22 and given Mucinex, Flonas and Albuterol.  She states on 08/26 she saw her PCP and they gave her a z-pak and Tussionex. She states she has continued cough and nasal congestion and contacted her PCP today and they suggested she come here for repeat COVID testing and a CXR.

## 2019-10-20 NOTE — ED Provider Notes (Signed)
[facility]    CSN: [id number] Arrival date & time: 10/20/19  1049      History   Chief Complaint Chief Complaint  Patient presents with  . Cough  . Nasal Congestion   HPI  74 year old female presents with above complaints.  Patient recently seen here on 8/22.  Diagnosed with a viral respiratory infection.  She was treated with albuterol, Flonase, Mucinex.  Patient followed up with her primary care physician on 8/26.  She was treated for sinusitis with azithromycin.  Patient states that she has yet to improve.  She has been taking her medication as prescribed without improvement.  Continues to have cough and congestion.  She was advised to come here for further evaluation including repeat Covid testing and chest x-ray.  Patient is a current smoker.  She has COPD.  Past Medical History:  Diagnosis Date  . Collagenous colitis   . Diverticulitis   . Hyperlipidemia   . Osteopenia   . Restless leg     Patient Active Problem List   Diagnosis Date Noted  . Aortic atherosclerosis ([place]) 11/05/2018  . COPD, moderate ([place]) 08/23/2015  . Asthma, mild intermittent 06/07/2015  . Hyperlipidemia 06/07/2015    Past Surgical History:  Procedure Laterality Date  . ABDOMINAL HYSTERECTOMY    . COLONOSCOPY    . TONSILLECTOMY      OB History   No obstetric history on file.      Home Medications    Prior to Admission medications   Medication Sig Start Date End Date Taking? Authorizing Provider  albuterol (VENTOLIN HFA) 108 (90 Base) MCG/ACT inhaler Inhale 1-2 puffs into the lungs every 6 (six) hours as needed for wheezing or shortness of breath. 10/09/19 11/08/19 Yes Danton Clap, PA-C  aspirin 81 MG tablet Take 81 mg by mouth daily.   Yes [provider]  budesonide (ENTOCORT EC) 3 MG 24 hr capsule Take 1 capsule by mouth in the morning. GI 06/17/19 06/16/20 Yes [provider]  calcium carbonate 1250 MG capsule Take 1,250 mg by mouth 2 (two) times  daily with a meal.   Yes [provider]  guaiFENesin-codeine (ROBITUSSIN AC) 100-10 MG/5ML syrup Take 5 mLs by mouth 4 (four) times daily as needed for cough. 10/13/19  Yes Juline Patch, MD  lovastatin (MEVACOR) 20 MG tablet Take 1 tablet (20 mg total) by mouth daily. 08/16/19  Yes Juline Patch, MD  Melatonin 10 MG TABS Take 1 tablet by mouth at bedtime.   Yes [provider]  montelukast (SINGULAIR) 10 MG tablet TAKE 1 TABLET(10 MG) BY MOUTH DAILY 08/16/19  Yes Juline Patch, MD  Multiple Vitamins-Minerals (CENTRUM SILVER ADULT 50+ PO) Take 1 capsule by mouth daily at 6 (six) AM.   Yes [provider]  fluticasone (FLONASE) 50 MCG/ACT nasal spray Place 1 spray into both nostrils in the morning and at bedtime for 10 days. 10/09/19 10/19/19  Danton Clap, PA-C  predniSONE (DELTASONE) 50 MG tablet 1 tablet daily x 5 days 10/20/19   Coral Spikes, DO    Family History Family History  Problem Relation Age of Onset  . Breast cancer Mother 51  . Heart attack Father     Social History Social History   Tobacco Use  . Smoking status: Current Every Day Smoker    Packs/day: 0.25    Years: 50.00    Pack years: 12.50    Types: Cigarettes    Start date: 02/17/1958  .  Smokeless tobacco: Never Used  . Tobacco comment: aware needs to stop   Vaping Use  . Vaping Use: Never used  Substance Use Topics  . Alcohol use: Yes    Alcohol/week: 1.0 standard drink    Types: 1 Glasses of wine per week  . Drug use: No     Allergies   Patient has no known allergies.   Review of Systems Review of Systems  HENT: Positive for congestion.   Respiratory: Positive for cough.    Physical Exam Triage Vital Signs ED Triage Vitals  Enc Vitals Group     BP 10/20/19 1150 (!) 165/77     Pulse Rate 10/20/19 1150 65     Resp 10/20/19 1150 18     Temp 10/20/19 1150 98.5 F (36.9 C)     Temp Source 10/20/19 1150 Oral     SpO2 10/20/19 1150 98 %     Weight 10/20/19 1148 150 lb  (68 kg)     Height 10/20/19 1148 5\' 7"  (1.702 m)     Head Circumference --      Peak Flow --      Pain Score 10/20/19 1148 0     Pain Loc --      Pain Edu? --      Excl. in [place]? --    Updated Vital Signs BP (!) 165/77 (BP Location: Right Arm)   Pulse 65   Temp 98.5 F (36.9 C) (Oral)   Resp 18   Ht 5\' 7"  (1.702 m)   Wt 68 kg   SpO2 98%   BMI 23.49 kg/m   Visual Acuity Right Eye Distance:   Left Eye Distance:   Bilateral Distance:    Right Eye Near:   Left Eye Near:    Bilateral Near:     Physical Exam Vitals and nursing note reviewed.  Constitutional:      General: She is not in acute distress.    Appearance: Normal appearance. She is not ill-appearing.  HENT:     Head: Normocephalic and atraumatic.  Eyes:     General:        Right eye: No discharge.        Left eye: No discharge.     Conjunctiva/sclera: Conjunctivae normal.  Cardiovascular:     Rate and Rhythm: Normal rate and regular rhythm.  Pulmonary:     Effort: Pulmonary effort is normal.     Breath sounds: Wheezing present.  Neurological:     Mental Status: She is alert.  Psychiatric:        Mood and Affect: Mood normal.        Behavior: Behavior normal.    UC Treatments / Results  Labs (all labs ordered are listed, but only abnormal results are displayed) Labs Reviewed  SARS CORONAVIRUS 2 (TAT 6-24 HRS)    EKG   Radiology DG Chest 2 View  Result Date: 10/20/2019 CLINICAL DATA:  Cough. Congestion. History of COPD. EXAM: CHEST - 2 VIEW COMPARISON:  Radiograph 06/16/2017. CT 05/14/2017 FINDINGS: Chronic hyperinflation, unchanged. The cardiomediastinal contours are stable, unchanged tortuosity of the thoracic aorta. Heart is normal in size. Pulmonary vasculature is normal. No consolidation, pleural effusion, or pneumothorax. No acute osseous abnormalities are seen. IMPRESSION: 1. Chronic hyperinflation. No acute abnormality. 2. Stable tortuosity of the thoracic aorta. Electronically Signed   By:  Keith Rake M.D.   On: 10/20/2019 12:22    Procedures Procedures (including critical care time)  Medications Ordered in UC Medications -  No data to display  Initial Impression / Assessment and Plan / UC Course  I have reviewed the triage vital signs and the nursing notes.  Pertinent labs & imaging results that were available during my care of the patient were reviewed by me and considered in my medical decision making (see chart for details).    74 year old female presents with COPD exacerbation.  Chest x-ray obtained and revealed no acute findings.  Chronic hyperinflation.  This is consistent with COPD.  Awaiting Covid test result.  Prednisone as directed.  Supportive care.  Final Clinical Impressions(s) / UC Diagnoses   Final diagnoses:  COPD exacerbation ([place])     Discharge Instructions     Continue the albuterol.  Prednisone as prescribed.  Follow up with PCP.  Take care  Dr. Lacinda Axon    ED Prescriptions    Medication Sig Dispense Auth. Provider   predniSONE (DELTASONE) 50 MG tablet 1 tablet daily x 5 days 5 tablet Thersa Salt G, DO     PDMP not reviewed this encounter.   Coral Spikes, DO 10/20/19 1249

## 2019-10-26 DIAGNOSIS — R0600 Dyspnea, unspecified: Secondary | ICD-10-CM | POA: Diagnosis not present

## 2019-10-26 DIAGNOSIS — J449 Chronic obstructive pulmonary disease, unspecified: Secondary | ICD-10-CM | POA: Diagnosis not present

## 2019-11-10 ENCOUNTER — Other Ambulatory Visit: Payer: Self-pay

## 2019-11-10 MED ORDER — BUDESONIDE 3 MG PO CPEP: 3.0000 mg | ORAL_CAPSULE | Freq: Every morning | ORAL | 11 refills | Status: DC

## 2019-11-10 NOTE — Telephone Encounter (Signed)
Last office visit 08/04/2019 Anemia Collagenous colitis  Last refill 06/17/2019 Historical medication given by pervious GI doctor

## 2019-11-17 DIAGNOSIS — J449 Chronic obstructive pulmonary disease, unspecified: Secondary | ICD-10-CM | POA: Diagnosis not present

## 2019-11-17 DIAGNOSIS — B370 Candidal stomatitis: Secondary | ICD-10-CM | POA: Diagnosis not present

## 2019-12-24 IMAGING — CT CT CHEST W/ CM
1 series · 15 of 33 positions shown, 19 images · IV contrast (iopamidol)
Comparison: Chest radiograph from 3 days prior

CLINICAL DATA: Pneumonia of right lower lobe.  Pulmonary nodule.

EXAM:
CT CHEST WITH CONTRAST
TECHNIQUE: Multidetector CT imaging of the chest was performed during
intravenous contrast administration.
CONTRAST:  75mL [id number] IOPAMIDOL (YUX2OQ-KDD) INJECTION 61%

[Series 2: axial st · axial · 0.74mm/px · z∈[-640,-334]mm · 15 of 181 slices shown, 19 images]
[im 14/181  mediastinal]
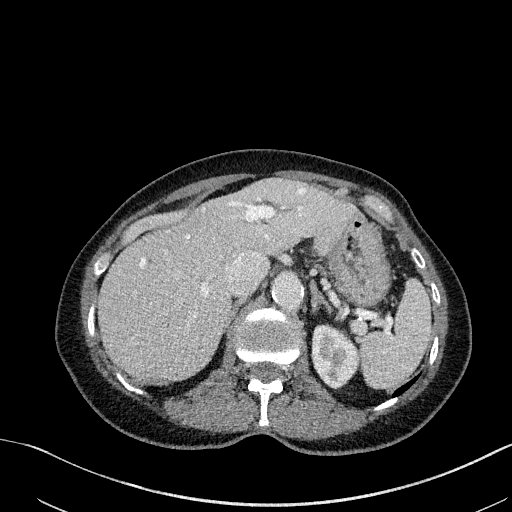
[im 14/181  lung]
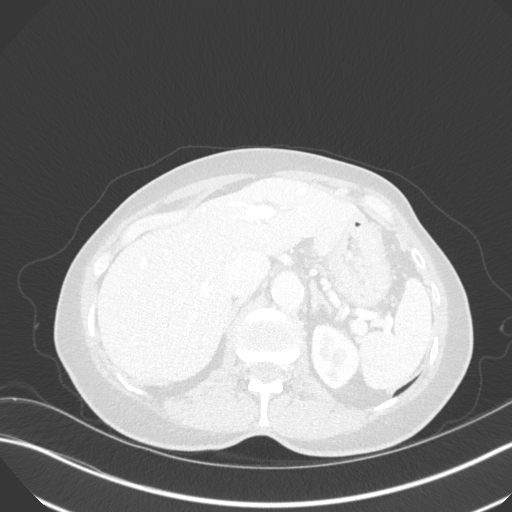
[im 27/181  lung]
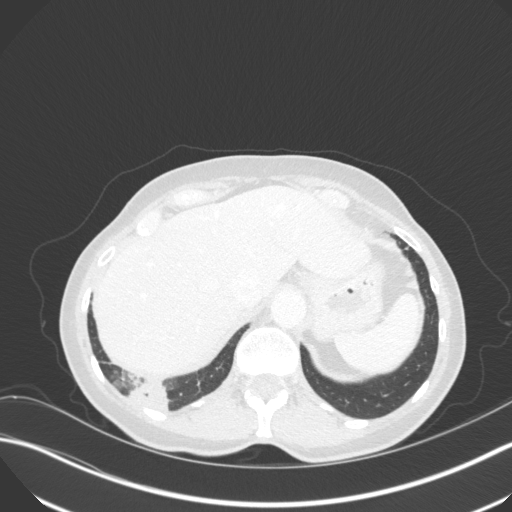
[im 37/181  lung]
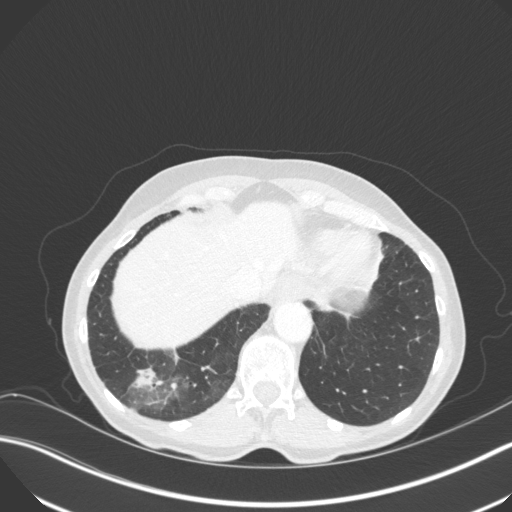
[im 47/181  lung]
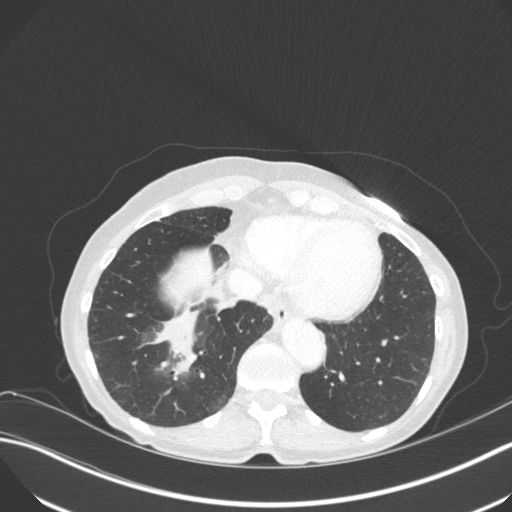
[im 61/181  mediastinal]
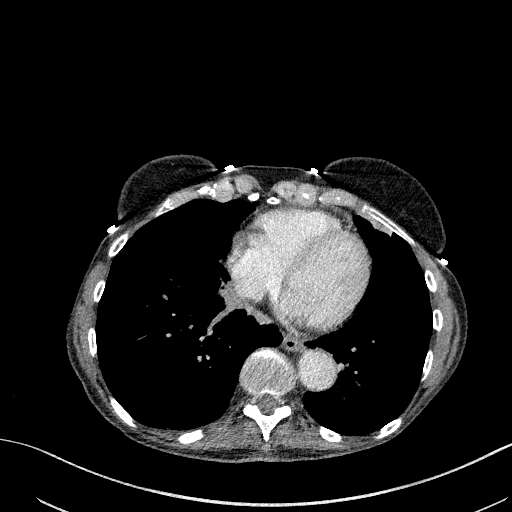
[im 61/181  lung]
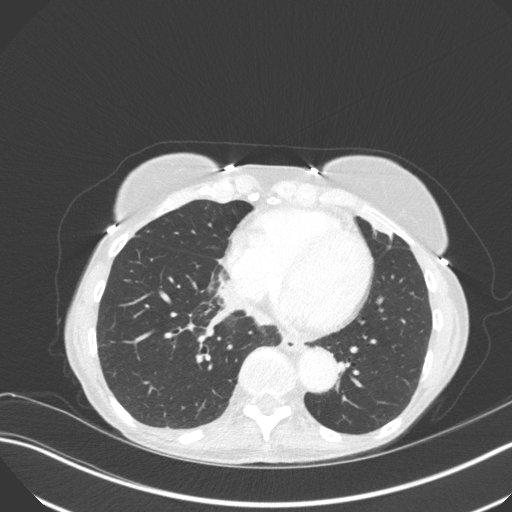
[im 73/181  lung]
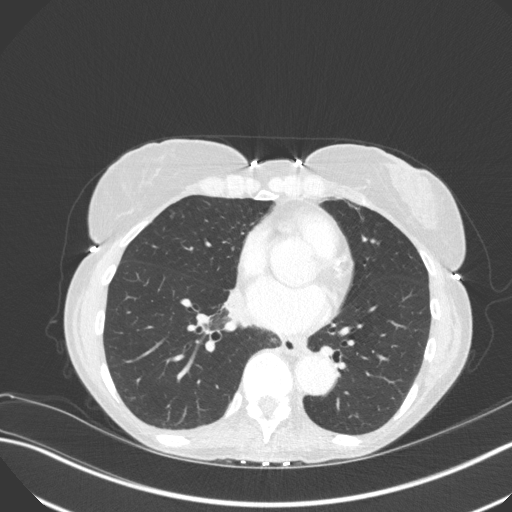
[im 81/181  lung]
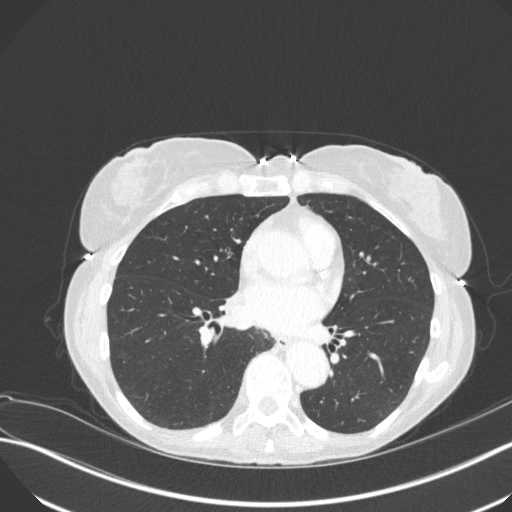
[im 94/181  lung]
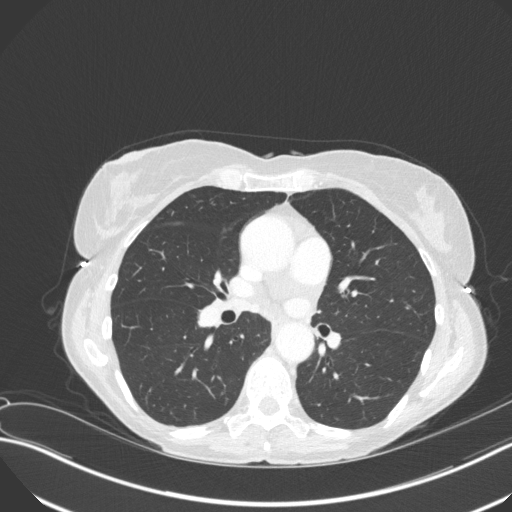
[im 101/181  mediastinal]
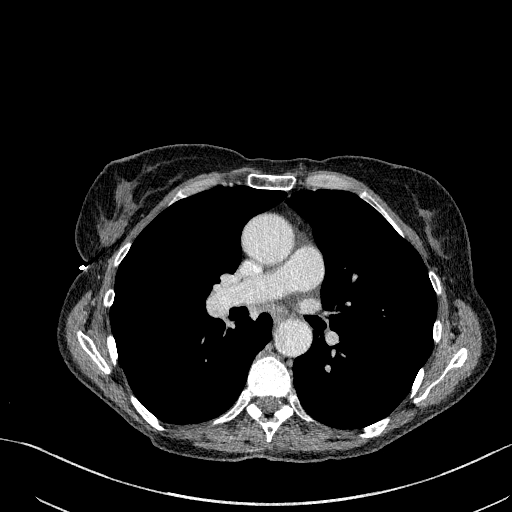
[im 101/181  lung]
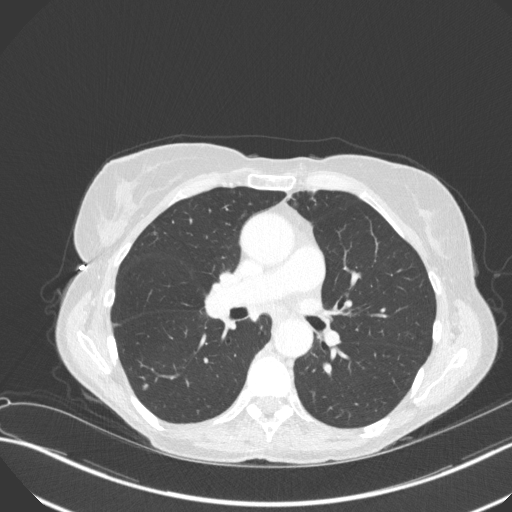
[im 109/181  lung]
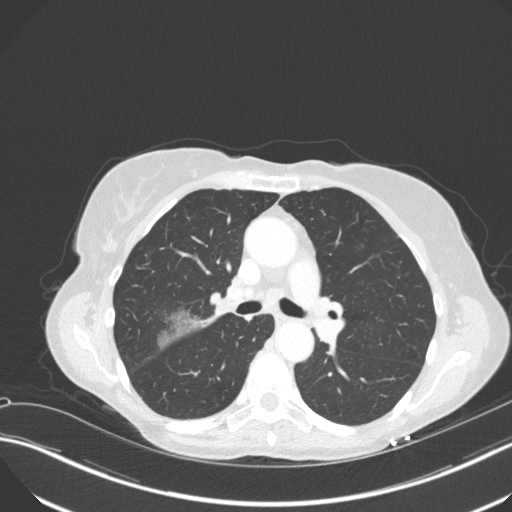
[im 121/181  lung]
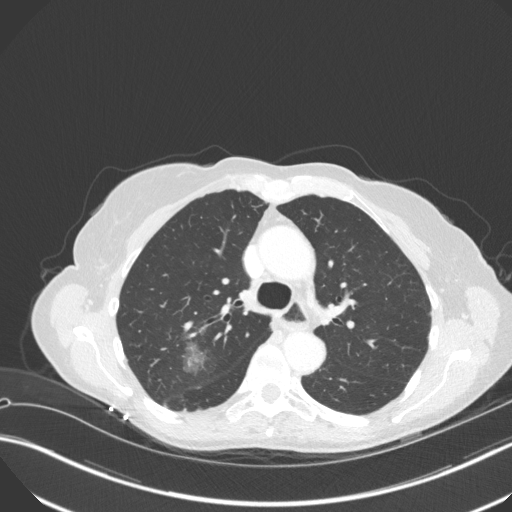
[im 134/181  lung]
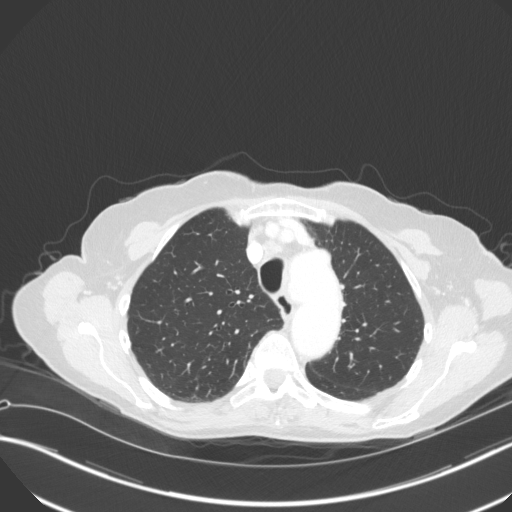
[im 145/181  mediastinal]
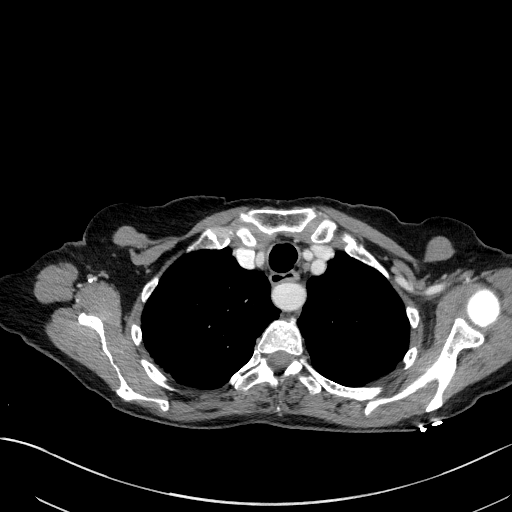
[im 145/181  lung]
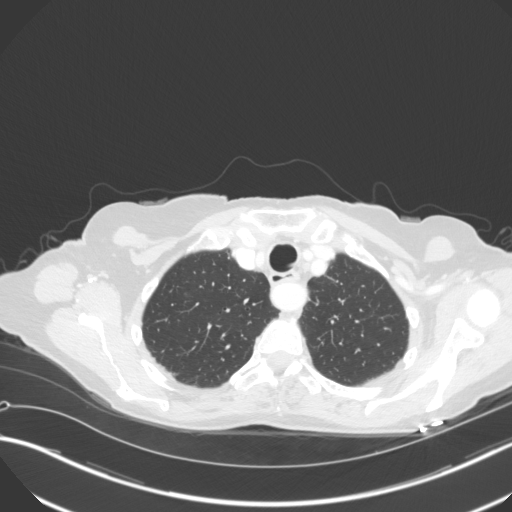
[im 154/181  lung]
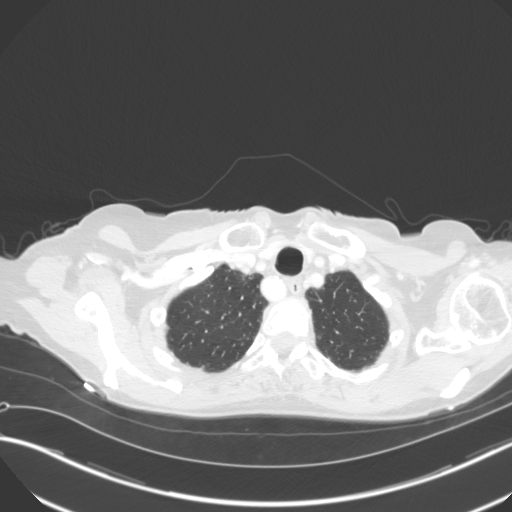
[im 167/181  lung]
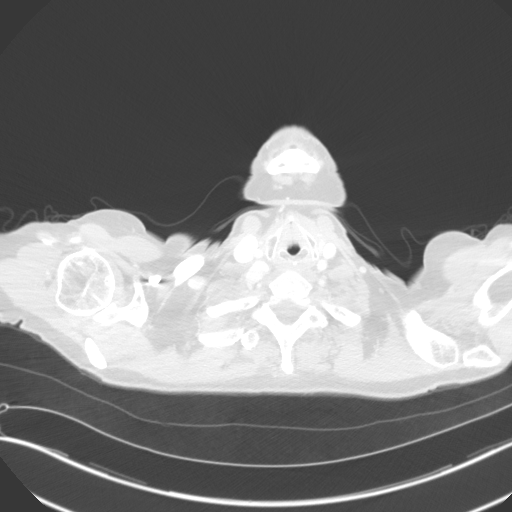

[15 of 33 positions shown; findings below may reference images not displayed]

FINDINGS: Cardiovascular: Normal heart size. No pericardial effusion.
Atherosclerotic calcification of the aorta and coronaries. Average
right subclavian artery with retroesophageal course. Prominent but
nonaneurysmal ascending aortic diameter of 38 mm. Tortuous thoracic
aorta. The opacified pulmonary arteries are patent

Mediastinum/Nodes: Negative for adenopathy or mass.

Lungs/Pleura: Ground-glass opacity in the posterior segment right
upper lobe. There is multi segment consolidation in the right lower
lobe. No suspicious nodules. Findings correlate with clinical
history of pneumonia. The central airways are clear.

Upper Abdomen: Stable intrahepatic bile duct prominence when
compared to 0665

Musculoskeletal: No acute or aggressive finding.
IMPRESSION: Right upper and right lower lobe pneumonia. The nodular density on
preceding chest x-ray is best attributed to infection. Followup PA
and lateral chest X-ray is recommended in 3-4 weeks following trial
of antibiotic therapy to ensure resolution.

## 2020-02-01 ENCOUNTER — Ambulatory Visit: Payer: Medicare Other | Admitting: Family Medicine

## 2020-02-23 ENCOUNTER — Other Ambulatory Visit: Payer: Self-pay

## 2020-02-23 ENCOUNTER — Encounter: Payer: Self-pay | Admitting: Family Medicine

## 2020-02-23 ENCOUNTER — Ambulatory Visit (INDEPENDENT_AMBULATORY_CARE_PROVIDER_SITE_OTHER): Payer: Medicare Other | Admitting: Family Medicine

## 2020-02-23 VITALS — BP 120/80 | HR 64 | Ht 67.0 in | Wt 152.0 lb

## 2020-02-23 DIAGNOSIS — E785 Hyperlipidemia, unspecified: Secondary | ICD-10-CM

## 2020-02-23 DIAGNOSIS — J4520 Mild intermittent asthma, uncomplicated: Secondary | ICD-10-CM

## 2020-02-23 MED ORDER — MONTELUKAST SODIUM 10 MG PO TABS: ORAL_TABLET | ORAL | 1 refills | Status: DC

## 2020-02-23 MED ORDER — LOVASTATIN 20 MG PO TABS: 20.0000 mg | ORAL_TABLET | Freq: Every day | ORAL | 1 refills | Status: DC

## 2020-02-23 MED ORDER — ALBUTEROL SULFATE HFA 108 (90 BASE) MCG/ACT IN AERS
1.0000 | INHALATION_SPRAY | Freq: Four times a day (QID) | RESPIRATORY_TRACT | 0 refills | Status: DC | PRN
Start: 2020-02-23 — End: 2020-05-21

## 2020-02-23 NOTE — Progress Notes (Addendum)
Date:  02/23/2020   Name:  Caroline Harris   DOB:  [birth year]   MRN:  [id number]   Chief Complaint: Allergic Rhinitis  and Hyperlipidemia  Hyperlipidemia This is a chronic problem. The current episode started more than 1 year ago. The problem is controlled. Recent lipid tests were reviewed and are normal. She has no history of chronic renal disease, diabetes, hypothyroidism, liver disease, obesity or nephrotic syndrome. There are no known factors aggravating her hyperlipidemia. Pertinent negatives include no chest pain, focal sensory loss, focal weakness, leg pain, myalgias or shortness of breath. She is currently on no antihyperlipidemic treatment. The current treatment provides mild improvement of lipids. There are no compliance problems.  Risk factors for coronary artery disease include dyslipidemia and hypertension.  Asthma There is no chest tightness, cough, difficulty breathing, frequent throat clearing, hemoptysis, hoarse voice, shortness of breath, sputum production or wheezing. This is a chronic problem. The current episode started more than 1 year ago. The problem has been gradually improving. Pertinent negatives include no chest pain, ear pain, fever, headaches, myalgias, PND, rhinorrhea, sneezing or sore throat. Her symptoms are alleviated by leukotriene antagonist. She reports moderate improvement on treatment. Her symptoms are not alleviated by beta-agonist. Her past medical history is significant for asthma and COPD. There is no history of bronchiectasis, bronchitis, emphysema or pneumonia.    Lab Results  Component Value Date   CREATININE 0.69 08/16/2019   BUN 15 08/16/2019   NA 142 08/16/2019   K 4.8 08/16/2019   CL 103 08/16/2019   CO2 26 08/16/2019   Lab Results  Component Value Date   CHOL 188 08/16/2019   HDL 85 08/16/2019   LDLCALC 86 08/16/2019   TRIG 97 08/16/2019   CHOLHDL 1.7 11/05/2018   Lab Results  Component Value Date   TSH 1.120 06/01/2017   No results  found for: HGBA1C Lab Results  Component Value Date   WBC 9.9 08/04/2019   HGB 14.1 08/04/2019   HCT 39.8 08/04/2019   MCV 92 08/04/2019   PLT 257 08/04/2019   Lab Results  Component Value Date   ALT 15 08/16/2019   AST 21 08/16/2019   ALKPHOS 34 (L) 08/16/2019   BILITOT 0.3 08/16/2019     Review of Systems  Constitutional: Negative.  Negative for chills, fatigue, fever and unexpected weight change.  HENT: Negative for congestion, ear discharge, ear pain, hoarse voice, rhinorrhea, sinus pressure, sneezing and sore throat.   Eyes: Negative for double vision, photophobia, pain, discharge, redness and itching.  Respiratory: Negative for cough, hemoptysis, sputum production, shortness of breath, wheezing and stridor.   Cardiovascular: Negative for chest pain and PND.  Gastrointestinal: Negative for abdominal pain, blood in stool, constipation, diarrhea, nausea and vomiting.  Endocrine: Negative for cold intolerance, heat intolerance, polydipsia, polyphagia and polyuria.  Genitourinary: Negative for dysuria, flank pain, frequency, hematuria, menstrual problem, pelvic pain, urgency, vaginal bleeding and vaginal discharge.  Musculoskeletal: Negative for arthralgias, back pain and myalgias.  Skin: Negative for rash.  Allergic/Immunologic: Negative for environmental allergies and food allergies.  Neurological: Negative for dizziness, focal weakness, weakness, light-headedness, numbness and headaches.  Hematological: Negative for adenopathy. Does not bruise/bleed easily.  Psychiatric/Behavioral: Negative for dysphoric mood. The patient is not nervous/anxious.     Patient Active Problem List   Diagnosis Date Noted  . Aortic atherosclerosis (HCC) 11/05/2018  . COPD, moderate (HCC) 08/23/2015  . Asthma, mild intermittent 06/07/2015  . Hyperlipidemia 06/07/2015    No Known Allergies  Past Surgical History:  Procedure Laterality Date  . ABDOMINAL HYSTERECTOMY    . COLONOSCOPY    .  TONSILLECTOMY      Social History   Tobacco Use  . Smoking status: Current Every Day Smoker    Packs/day: 0.25    Years: 50.00    Pack years: 12.50    Types: Cigarettes    Start date: 02/17/1958  . Smokeless tobacco: Never Used  . Tobacco comment: aware needs to stop   Vaping Use  . Vaping Use: Never used  Substance Use Topics  . Alcohol use: Yes    Alcohol/week: 1.0 standard drink    Types: 1 Glasses of wine per week  . Drug use: No     Medication list has been reviewed and updated.  Current Meds  Medication Sig  . albuterol (VENTOLIN HFA) 108 (90 Base) MCG/ACT inhaler Inhale 1-2 puffs into the lungs every 6 (six) hours as needed for wheezing or shortness of breath.  Marland Kitchen aspirin 81 MG tablet Take 81 mg by mouth daily.  . budesonide (ENTOCORT EC) 3 MG 24 hr capsule Take 1 capsule (3 mg total) by mouth in the morning. GI  . calcium carbonate 1250 MG capsule Take 1,250 mg by mouth 2 (two) times daily with a meal.  . lovastatin (MEVACOR) 20 MG tablet Take 1 tablet (20 mg total) by mouth daily.  . Melatonin 10 MG TABS Take 1 tablet by mouth at bedtime.  . montelukast (SINGULAIR) 10 MG tablet TAKE 1 TABLET(10 MG) BY MOUTH DAILY  . Multiple Vitamins-Minerals (CENTRUM SILVER ADULT 50+ PO) Take 1 capsule by mouth daily at 6 (six) AM.  . [DISCONTINUED] fluticasone (FLONASE) 50 MCG/ACT nasal spray Place 1 spray into both nostrils in the morning and at bedtime for 10 days.    PHQ 2/9 Scores 02/23/2020 09/16/2019 07/04/2019 05/11/2019  PHQ - 2 Score 0 0 0 0  PHQ- 9 Score 0 0 0 -    GAD 7 : Generalized Anxiety Score 09/16/2019 07/04/2019  Nervous, Anxious, on Edge 0 0  Control/stop worrying 0 0  Worry too much - different things 0 0  Trouble relaxing 0 0  Restless 0 0  Easily annoyed or irritable 0 0  Afraid - awful might happen 0 0  Total GAD 7 Score 0 0    BP Readings from Last 3 Encounters:  02/23/20 120/80  10/20/19 (!) 165/77  10/13/19 120/72    Physical Exam Vitals and  nursing note reviewed.  Constitutional:      Appearance: She is well-developed and well-nourished.  HENT:     Head: Normocephalic.     Right Ear: External ear normal.     Left Ear: External ear normal.     Nose: Nose normal.     Mouth/Throat:     Mouth: Oropharynx is clear and moist. Mucous membranes are moist.  Eyes:     General: Lids are everted, no foreign bodies appreciated. No scleral icterus.       Left eye: No foreign body or hordeolum.     Extraocular Movements: EOM normal.     Conjunctiva/sclera: Conjunctivae normal.     Right eye: Right conjunctiva is not injected.     Left eye: Left conjunctiva is not injected.     Pupils: Pupils are equal, round, and reactive to light.  Neck:     Thyroid: No thyromegaly.     Vascular: No JVD.     Trachea: No tracheal deviation.  Cardiovascular:  Rate and Rhythm: Normal rate and regular rhythm.     Pulses: Intact distal pulses.     Heart sounds: Normal heart sounds. No murmur heard. No friction rub. No gallop.   Pulmonary:     Effort: Pulmonary effort is normal. No respiratory distress.     Breath sounds: Normal breath sounds. No wheezing, rhonchi or rales.  Abdominal:     General: Bowel sounds are normal.     Palpations: Abdomen is soft. There is no hepatosplenomegaly or mass.     Tenderness: There is no abdominal tenderness. There is no guarding or rebound.  Musculoskeletal:        General: No tenderness or edema. Normal range of motion.     Cervical back: Normal range of motion and neck supple.  Lymphadenopathy:     Cervical: No cervical adenopathy.  Skin:    General: Skin is warm.     Findings: No rash.  Neurological:     Mental Status: She is alert and oriented to person, place, and time.     Cranial Nerves: No cranial nerve deficit.     Deep Tendon Reflexes: Strength normal. Reflexes normal.  Psychiatric:        Mood and Affect: Mood and affect normal. Mood is not anxious or depressed.     Wt Readings from Last 3  Encounters:  02/23/20 152 lb (68.9 kg)  10/20/19 150 lb (68 kg)  10/13/19 152 lb (68.9 kg)    BP 120/80   Pulse 64   Ht 5\' 7"  (1.702 m)   Wt 152 lb (68.9 kg)   BMI 23.81 kg/m   Assessment and Plan: 1. Hyperlipidemia, unspecified hyperlipidemia type Chronic.  Controlled.  Stable.  Continue lovastatin 20 mg once a day.  Reviewed patient's July labs and patient is well in acceptable range with only an issue of a mildly elevated HDL.  Patient will continue current therapy and dietary approach and will recheck in 6 months - lovastatin (MEVACOR) 20 MG tablet; Take 1 tablet (20 mg total) by mouth daily.  Dispense: 90 tablet; Refill: 1  2. Mild intermittent asthma, unspecified whether complicated Chronic.  Controlled.  Stable.  Continue Singulair 10 mg once a day.  Although patient asked to have albuterol inhaler removed from her chart given the history of her occasional cough that has a bronchospastic component I have continued this in her medication history for her to have on a as needed basis. - montelukast (SINGULAIR) 10 MG tablet; TAKE 1 TABLET(10 MG) BY MOUTH DAILY  Dispense: 90 tablet; Refill: 1

## 2020-03-15 DIAGNOSIS — Z1231 Encounter for screening mammogram for malignant neoplasm of breast: Secondary | ICD-10-CM | POA: Diagnosis not present

## 2020-05-16 ENCOUNTER — Ambulatory Visit: Payer: Medicare Other

## 2020-05-21 ENCOUNTER — Other Ambulatory Visit: Payer: Self-pay

## 2020-05-21 ENCOUNTER — Ambulatory Visit (INDEPENDENT_AMBULATORY_CARE_PROVIDER_SITE_OTHER): Payer: Medicare Other

## 2020-05-21 VITALS — BP 142/86 | HR 59 | Temp 97.8°F | Resp 16 | Ht 67.0 in | Wt 158.6 lb

## 2020-05-21 DIAGNOSIS — Z780 Asymptomatic menopausal state: Secondary | ICD-10-CM

## 2020-05-21 DIAGNOSIS — Z0000 Encounter for general adult medical examination without abnormal findings: Secondary | ICD-10-CM | POA: Diagnosis not present

## 2020-05-21 NOTE — Progress Notes (Signed)
Subjective:   Caroline Harris is a 75 y.o. female who presents for Medicare Annual (Subsequent) preventive examination.  Review of Systems     Cardiac Risk Factors include: advanced age (>29men, >31 women);dyslipidemia     Objective:    Today's Vitals   05/21/20 1129 05/21/20 1131  BP: (!) 142/86   Pulse: (!) 59   Resp: 16   Temp: 97.8 F (36.6 C)   TempSrc: Oral   SpO2: 99%   Weight: 158 lb 9.6 oz (71.9 kg)   Height: 5\' 7"  (1.702 m)   PainSc:  5    Body mass index is 24.84 kg/m.  Advanced Directives 05/21/2020 05/11/2019 04/05/2018 01/22/2018 08/10/2014  Does Patient Have a Medical Advance Directive? No No No No No  Would patient like information on creating a medical advance directive? Yes (MAU/Ambulatory/Procedural Areas - Information given) No - Patient declined Yes (MAU/Ambulatory/Procedural Areas - Information given) - No - patient declined information    Current Medications (verified) Outpatient Encounter Medications as of 05/21/2020  Medication Sig  . aspirin 81 MG tablet Take 81 mg by mouth daily.  . budesonide (ENTOCORT EC) 3 MG 24 hr capsule Take 1 capsule (3 mg total) by mouth in the morning. GI  . calcium carbonate 1250 MG capsule Take 1,250 mg by mouth 2 (two) times daily with a meal.  . lovastatin (MEVACOR) 20 MG tablet Take 1 tablet (20 mg total) by mouth daily.  . Melatonin 10 MG TABS Take 1 tablet by mouth at bedtime.  . montelukast (SINGULAIR) 10 MG tablet TAKE 1 TABLET(10 MG) BY MOUTH DAILY  . Multiple Vitamins-Minerals (CENTRUM SILVER ADULT 50+ PO) Take 1 capsule by mouth daily at 6 (six) AM.  . [DISCONTINUED] albuterol (VENTOLIN HFA) 108 (90 Base) MCG/ACT inhaler Inhale 1-2 puffs into the lungs every 6 (six) hours as needed for wheezing or shortness of breath.  . [DISCONTINUED] guaiFENesin-codeine (ROBITUSSIN AC) 100-10 MG/5ML syrup Take 5 mLs by mouth 4 (four) times daily as needed for cough.   No facility-administered encounter medications on file as of  05/21/2020.    Allergies (verified) Patient has no known allergies.   History: Past Medical History:  Diagnosis Date  . Collagenous colitis   . Diverticulitis   . Hyperlipidemia   . Osteopenia   . Restless leg    Past Surgical History:  Procedure Laterality Date  . ABDOMINAL HYSTERECTOMY    . COLONOSCOPY    . TONSILLECTOMY     Family History  Problem Relation Age of Onset  . Breast cancer Mother 30  . Heart attack Father    Social History   Socioeconomic History  . Marital status: Married    Spouse name: Not on file  . Number of children: 3  . Years of education: Not on file  . Highest education level: Bachelor's degree (e.g., BA, AB, BS)  Occupational History    Comment: part time music teacher at [employer]  Tobacco Use  . Smoking status: Current Every Day Smoker    Packs/day: 0.25    Years: 50.00    Pack years: 12.50    Types: Cigarettes    Start date: 02/17/1958  . Smokeless tobacco: Never Used  . Tobacco comment: aware needs to stop   Vaping Use  . Vaping Use: Never used  Substance and Sexual Activity  . Alcohol use: Yes    Alcohol/week: 1.0 standard drink    Types: 1 Glasses of wine per week  . Drug use:  No  . Sexual activity: Not Currently  Other Topics Concern  . Not on file  Social History Narrative  . Not on file   Social Determinants of Health   Financial Resource Strain: Low Risk   . Difficulty of Paying Living Expenses: Not hard at all  Food Insecurity: No Food Insecurity  . Worried About Charity fundraiser in the Last Year: Never true  . Ran Out of Food in the Last Year: Never true  Transportation Needs: No Transportation Needs  . Lack of Transportation (Medical): No  . Lack of Transportation (Non-Medical): No  Physical Activity: Insufficiently Active  . Days of Exercise per Week: 2 days  . Minutes of Exercise per Session: 30 min  Stress: No Stress Concern Present  . Feeling of Stress : Not at all  Social Connections:  Moderately Integrated  . Frequency of Communication with Friends and Family: More than three times a week  . Frequency of Social Gatherings with Friends and Family: Three times a week  . Attends Religious Services: More than 4 times per year  . Active Member of Clubs or Organizations: No  . Attends Archivist Meetings: Never  . Marital Status: Married    Tobacco Counseling Ready to quit: No Counseling given: Not Answered Comment: aware needs to stop    Clinical Intake:  Pre-visit preparation completed: Yes  Pain : 0-10 Pain Score: 5  Pain Type: Chronic pain Pain Location: Finger (Comment which one) Pain Orientation: Right,Mid Pain Descriptors / Indicators: Aching,Sore (arthritis) Pain Onset: More than a month ago Pain Frequency: Constant     BMI - recorded: 24.84 Nutritional Status: BMI of 19-24  Normal Nutritional Risks: None Diabetes: No  How often do you need to have someone help you when you read instructions, pamphlets, or other written materials from your doctor or pharmacy?: 1 - Never    Interpreter Needed?: No  Information entered by :: Clemetine Marker LPN   Activities of Daily Living In your present state of health, do you have any difficulty performing the following activities: 05/21/2020  Hearing? N  Comment declines hearing aids  Vision? N  Difficulty concentrating or making decisions? N  Walking or climbing stairs? N  Dressing or bathing? N  Doing errands, shopping? N  Preparing Food and eating ? N  Using the Toilet? N  In the past six months, have you accidently leaked urine? N  Do you have problems with loss of bowel control? N  Managing your Medications? N  Managing your Finances? N  Housekeeping or managing your Housekeeping? N  Some recent data might be hidden    Patient Care Team: Juline Patch, MD as PCP - General (Family Medicine) Lin Landsman, MD as Consulting Physician (Gastroenterology) Erby Pian, MD as  Referring Physician (Specialist) Albertine Patricia, DPM as Attending Physician (Podiatry) Roseanne Kaufman, MD as Consulting Physician (Orthopedic Surgery)  Indicate any recent Medical Services you may have received from other than [facility] providers in the past year (date may be approximate).     Assessment:   This is a routine wellness examination for Canesha.  Hearing/Vision screen  Hearing Screening   125Hz  250Hz  500Hz  1000Hz  2000Hz  3000Hz  4000Hz  6000Hz  8000Hz   Right ear:           Left ear:           Comments: Pt has no difficulty hearing  Vision Screening Comments: Annual vision screenings with Dr[facility] Annamaria H[facility]  Dietary issues  and exercise activities discussed: Current Exercise Habits: Home exercise routine, Type of exercise: walking, Time (Minutes): 30, Frequency (Times/Week): 2, Weekly Exercise (Minutes/Week): 60, Intensity: Mild, Exercise limited by: None identified  Goals    . Quit Smoking     Pt states she would like to quit smoking over the next year.       Depression Screen PHQ 2/9 Scores 05/21/2020 02/23/2020 09/16/2019 07/04/2019 05/11/2019 05/11/2019 11/05/2018  PHQ - 2 Score 0 0 0 0 0 0 0  PHQ- 9 Score - 0 0 0 - - -    Fall Risk Fall Risk  05/21/2020 02/23/2020 09/16/2019 07/04/2019 05/11/2019  Falls in the past year? 0 0 0 0 0  Number falls in past yr: 0 - - - 0  Comment tripped in yard once no injuries - - - -  Injury with Fall? 0 - - - 0  Risk for fall due to : No Fall Risks - - - No Fall Risks  Follow up Falls prevention discussed Falls evaluation completed Falls evaluation completed Falls evaluation completed Falls prevention discussed    FALL RISK PREVENTION PERTAINING TO THE HOME:   Any stairs in or around the home? Yes  If so, are there any without handrails? No  Home free of loose throw rugs in walkways, pet beds, electrical cords, etc? Yes  Adequate lighting in your home to reduce risk of falls? Yes   ASSISTIVE DEVICES UTILIZED TO PREVENT  FALLS:  Life alert? No  Use of a cane, walker or w/c? No  Grab bars in the bathroom? No  Shower chair or bench in shower? No  Elevated toilet seat or a handicapped toilet? Yes   TIMED UP AND GO:  Was the test performed? Yes .  Length of time to ambulate 10 feet: 4 sec.   Gait steady and fast without use of assistive device  Cognitive Function:     6CIT Screen 05/11/2019 04/05/2018  What Year? 0 points 0 points  What month? 0 points 0 points  What time? 0 points 0 points  Count back from 20 0 points 0 points  Months in reverse 0 points 0 points  Repeat phrase 0 points 0 points  Total Score 0 0    Immunizations Immunization History  Administered Date(s) Administered  . Fluad Quad(high Dose 65+) 11/05/2018  . Influenza, High Dose Seasonal PF 11/25/2017  . Influenza,inj,Quad PF,6+ Mos 01/04/2015, 12/13/2015  . Influenza-Unspecified 11/20/2016, 11/25/2017, 11/26/2019  . Moderna Sars-Covid-2 Vaccination 03/18/2019, 04/15/2019, 12/19/2019  . Pneumococcal Conjugate-13 08/13/2015  . Pneumococcal Polysaccharide-23 03/21/2011  . Tdap 03/21/2011  . Zoster 02/18/2015  . Zoster Recombinat (Shingrix) 12/02/2017, 02/02/2018    TDAP status: Up to date  Flu Vaccine status: Up to date  Pneumococcal vaccine status: Up to date  Covid-19 vaccine status: Completed vaccines  Qualifies for Shingles Vaccine? Yes   Zostavax completed Yes   Shingrix Completed?: Yes  Screening Tests Health Maintenance  Topic Date Due  . INFLUENZA VACCINE  09/17/2020  . TETANUS/TDAP  03/20/2021  . COLONOSCOPY (Pts 45-35yrs Insurance coverage will need to be confirmed)  05/14/2023  . DEXA SCAN  Completed  . COVID-19 Vaccine  Completed  . Hepatitis C Screening  Completed  . PNA vac Low Risk Adult  Completed  . HPV VACCINES  Aged Out    Health Maintenance  There are no preventive care reminders to display for this patient.  Colorectal cancer screening: Type of screening: Colonoscopy. Completed  05/14/18. Repeat every 5 years  Mammogram status: Completed 03/15/20. Repeat every year  Bone Density status: Completed 04/26/18. Results reflect: Bone density results: OSTEOPENIA. Repeat every 2 years.  Lung Cancer Screening: (Low Dose CT Chest recommended if Age 92-80 years, 30 pack-year currently smoking OR have quit w/in 15years.) does not qualify.   Additional Screening:  Hepatitis C Screening: does qualify; Completed 02/22/16  Vision Screening: Recommended annual ophthalmology exams for early detection of glaucoma and other disorders of the eye. Is the patient up to date with their annual eye exam?  Yes  Who is the provider or what is the name of the office in which the patient attends annual eye exams? [place] Screening: Recommended annual dental exams for proper oral hygiene  Community Resource Referral / Chronic Care Management: CRR required this visit?  No   CCM required this visit?  No      Plan:     I have personally reviewed and noted the following in the patient's chart:   . Medical and social history . Use of alcohol, tobacco or illicit drugs  . Current medications and supplements . Functional ability and status . Nutritional status . Physical activity . Advanced directives . List of other physicians . Hospitalizations, surgeries, and ER visits in previous 12 months . Vitals . Screenings to include cognitive, depression, and falls . Referrals and appointments  In addition, I have reviewed and discussed with patient certain preventive protocols, quality metrics, and best practice recommendations. A written personalized care plan for preventive services as well as general preventive health recommendations were provided to patient.     Clemetine Marker, LPN   05/18/9377   Nurse Notes: pt c/o pain in left middle finger; scheduled to see Dr. Amedeo Plenty in May.

## 2020-05-21 NOTE — Patient Instructions (Signed)
Caroline Harris , Thank you for taking time to come for your Medicare Wellness Visit. I appreciate your ongoing commitment to your health goals. Please review the following plan we discussed and let me know if I can assist you in the future.   Screening recommendations/referrals: Colonoscopy: done 05/14/18. Repeat in 2025 Mammogram: done 03/15/20 Bone Density: done 04/26/18. Please call [phone] to schedule your bone density screening.  Recommended yearly ophthalmology/optometry visit for glaucoma screening and checkup Recommended yearly dental visit for hygiene and checkup  Vaccinations: Influenza vaccine: done 11/26/19 Pneumococcal vaccine: done 08/13/15 Tdap vaccine: done 10/30/11 Shingles vaccine: done 12/02/17 & 02/02/18    Covid-19: done 03/18/19, 04/15/19 & 12/19/19   Advanced directives: Advance directive discussed with you today. I have provided a copy for you to complete at home and have notarized. Once this is complete please bring a copy in to our office so we can scan it into your chart.  Conditions/risks identified: If you wish to quit smoking, help is available. For free tobacco cessation program offerings call the [facility] at [phone] or [facility] at (919)058-4746. You may also visit [email or web address] or email [email or web address] for more information on other programs.   Next appointment: Follow up in one year for your annual wellness visit    Preventive Care 65 Years and Older, Female Preventive care refers to lifestyle choices and visits with your health care provider that can promote health and wellness. What does preventive care include?  A yearly physical exam. This is also called an annual well check.  Dental exams once or twice a year.  Routine eye exams. Ask your health care provider how often you should have your eyes checked.  Personal lifestyle choices, including:  Daily care of your teeth and gums.  Regular physical  activity.  Eating a healthy diet.  Avoiding tobacco and drug use.  Limiting alcohol use.  Practicing safe sex.  Taking low-dose aspirin every day.  Taking vitamin and mineral supplements as recommended by your health care provider. What happens during an annual well check? The services and screenings done by your health care provider during your annual well check will depend on your age, overall health, lifestyle risk factors, and family history of disease. Counseling  Your health care provider may ask you questions about your:  Alcohol use.  Tobacco use.  Drug use.  Emotional well-being.  Home and relationship well-being.  Sexual activity.  Eating habits.  History of falls.  Memory and ability to understand (cognition).  Work and work Statistician.  Reproductive health. Screening  You may have the following tests or measurements:  Height, weight, and BMI.  Blood pressure.  Lipid and cholesterol levels. These may be checked every 5 years, or more frequently if you are over 42 years old.  Skin check.  Lung cancer screening. You may have this screening every year starting at age 75 if you have a 30-pack-year history of smoking and currently smoke or have quit within the past 15 years.  Fecal occult blood test (FOBT) of the stool. You may have this test every year starting at age 80.  Flexible sigmoidoscopy or colonoscopy. You may have a sigmoidoscopy every 5 years or a colonoscopy every 10 years starting at age 44.  Hepatitis C blood test.  Hepatitis B blood test.  Sexually transmitted disease (STD) testing.  Diabetes screening. This is done by checking your blood sugar (glucose) after you have not eaten for a while (fasting). You may  have this done every 1-3 years.  Bone density scan. This is done to screen for osteoporosis. You may have this done starting at age 32.  Mammogram. This may be done every 1-2 years. Talk to your health care provider about  how often you should have regular mammograms. Talk with your health care provider about your test results, treatment options, and if necessary, the need for more tests. Vaccines  Your health care provider may recommend certain vaccines, such as:  Influenza vaccine. This is recommended every year.  Tetanus, diphtheria, and acellular pertussis (Tdap, Td) vaccine. You may need a Td booster every 10 years.  Zoster vaccine. You may need this after age 39.  Pneumococcal 13-valent conjugate (PCV13) vaccine. One dose is recommended after age 2.  Pneumococcal polysaccharide (PPSV23) vaccine. One dose is recommended after age 46. Talk to your health care provider about which screenings and vaccines you need and how often you need them. This information is not intended to replace advice given to you by your health care provider. Make sure you discuss any questions you have with your health care provider. Document Released: 03/02/2015 Document Revised: 10/24/2015 Document Reviewed: 12/05/2014 Elsevier Interactive Patient Education  2017 [place] Prevention in the Home Falls can cause injuries. They can happen to people of all ages. There are many things you can do to make your home safe and to help prevent falls. What can I do on the outside of my home?  Regularly fix the edges of walkways and driveways and fix any cracks.  Remove anything that might make you trip as you walk through a door, such as a raised step or threshold.  Trim any bushes or trees on the path to your home.  Use bright outdoor lighting.  Clear any walking paths of anything that might make someone trip, such as rocks or tools.  Regularly check to see if handrails are loose or broken. Make sure that both sides of any steps have handrails.  Any raised decks and porches should have guardrails on the edges.  Have any leaves, snow, or ice cleared regularly.  Use sand or salt on walking paths during  winter.  Clean up any spills in your garage right away. This includes oil or grease spills. What can I do in the bathroom?  Use night lights.  Install grab bars by the toilet and in the tub and shower. Do not use towel bars as grab bars.  Use non-skid mats or decals in the tub or shower.  If you need to sit down in the shower, use a plastic, non-slip stool.  Keep the floor dry. Clean up any water that spills on the floor as soon as it happens.  Remove soap buildup in the tub or shower regularly.  Attach bath mats securely with double-sided non-slip rug tape.  Do not have throw rugs and other things on the floor that can make you trip. What can I do in the bedroom?  Use night lights.  Make sure that you have a light by your bed that is easy to reach.  Do not use any sheets or blankets that are too big for your bed. They should not hang down onto the floor.  Have a firm chair that has side arms. You can use this for support while you get dressed.  Do not have throw rugs and other things on the floor that can make you trip. What can I do in the kitchen?  Clean up  any spills right away.  Avoid walking on wet floors.  Keep items that you use a lot in easy-to-reach places.  If you need to reach something above you, use a strong step stool that has a grab bar.  Keep electrical cords out of the way.  Do not use floor polish or wax that makes floors slippery. If you must use wax, use non-skid floor wax.  Do not have throw rugs and other things on the floor that can make you trip. What can I do with my stairs?  Do not leave any items on the stairs.  Make sure that there are handrails on both sides of the stairs and use them. Fix handrails that are broken or loose. Make sure that handrails are as long as the stairways.  Check any carpeting to make sure that it is firmly attached to the stairs. Fix any carpet that is loose or worn.  Avoid having throw rugs at the top or  bottom of the stairs. If you do have throw rugs, attach them to the floor with carpet tape.  Make sure that you have a light switch at the top of the stairs and the bottom of the stairs. If you do not have them, ask someone to add them for you. What else can I do to help prevent falls?  Wear shoes that:  Do not have high heels.  Have rubber bottoms.  Are comfortable and fit you well.  Are closed at the toe. Do not wear sandals.  If you use a stepladder:  Make sure that it is fully opened. Do not climb a closed stepladder.  Make sure that both sides of the stepladder are locked into place.  Ask someone to hold it for you, if possible.  Clearly mark and make sure that you can see:  Any grab bars or handrails.  First and last steps.  Where the edge of each step is.  Use tools that help you move around (mobility aids) if they are needed. These include:  Canes.  Walkers.  Scooters.  Crutches.  Turn on the lights when you go into a dark area. Replace any light bulbs as soon as they burn out.  Set up your furniture so you have a clear path. Avoid moving your furniture around.  If any of your floors are uneven, fix them.  If there are any pets around you, be aware of where they are.  Review your medicines with your doctor. Some medicines can make you feel dizzy. This can increase your chance of falling. Ask your doctor what other things that you can do to help prevent falls. This information is not intended to replace advice given to you by your health care provider. Make sure you discuss any questions you have with your health care provider. Document Released: 11/30/2008 Document Revised: 07/12/2015 Document Reviewed: 03/10/2014 Elsevier Interactive Patient Education  2017 [employer].

## 2020-05-28 ENCOUNTER — Other Ambulatory Visit: Payer: Medicare Other

## 2020-06-07 ENCOUNTER — Other Ambulatory Visit: Payer: Self-pay | Admitting: Family Medicine

## 2020-06-07 ENCOUNTER — Telehealth: Payer: Self-pay

## 2020-06-07 DIAGNOSIS — Z780 Asymptomatic menopausal state: Secondary | ICD-10-CM

## 2020-06-07 NOTE — Telephone Encounter (Signed)
Pt cancelled her bone density on 05/28/20- I left message with telephone number for her to call and reschedule her appt

## 2020-06-12 ENCOUNTER — Other Ambulatory Visit: Payer: Self-pay

## 2020-06-12 ENCOUNTER — Encounter: Payer: Self-pay | Admitting: Family Medicine

## 2020-06-12 ENCOUNTER — Ambulatory Visit (INDEPENDENT_AMBULATORY_CARE_PROVIDER_SITE_OTHER): Payer: Medicare Other | Admitting: Family Medicine

## 2020-06-12 VITALS — BP 120/80 | HR 72 | Temp 98.2°F | Ht 67.0 in | Wt 156.0 lb

## 2020-06-12 DIAGNOSIS — J0100 Acute maxillary sinusitis, unspecified: Secondary | ICD-10-CM | POA: Diagnosis not present

## 2020-06-12 MED ORDER — DOXYCYCLINE HYCLATE 100 MG PO TABS: 100.0000 mg | ORAL_TABLET | Freq: Two times a day (BID) | ORAL | 0 refills | Status: DC

## 2020-06-12 MED ORDER — GUAIFENESIN-CODEINE 100-10 MG/5ML PO SYRP: 5.0000 mL | ORAL_SOLUTION | Freq: Four times a day (QID) | ORAL | 0 refills | Status: DC | PRN

## 2020-06-12 NOTE — Progress Notes (Signed)
Date:  06/12/2020   Name:  Caroline Harris   DOB:  1945-08-31   MRN:  [id number]   Chief Complaint: Sinusitis (Pressure around eyes and nose- green production, cough just started)  Sinusitis This is a new problem. The current episode started in the past 7 days. The problem has been gradually worsening since onset. There has been no fever. Her pain is at a severity of 2/10. The pain is mild. Associated symptoms include congestion and sinus pressure. Pertinent negatives include no chills, coughing, diaphoresis, ear pain, headaches, hoarse voice, neck pain, shortness of breath, sneezing, sore throat or swollen glands. Past treatments include nothing. The treatment provided moderate relief.    Lab Results  Component Value Date   CREATININE 0.69 08/16/2019   BUN 15 08/16/2019   NA 142 08/16/2019   K 4.8 08/16/2019   CL 103 08/16/2019   CO2 26 08/16/2019   Lab Results  Component Value Date   CHOL 188 08/16/2019   HDL 85 08/16/2019   LDLCALC 86 08/16/2019   TRIG 97 08/16/2019   CHOLHDL 1.7 11/05/2018   Lab Results  Component Value Date   TSH 1.120 06/01/2017   No results found for: HGBA1C Lab Results  Component Value Date   WBC 9.9 08/04/2019   HGB 14.1 08/04/2019   HCT 39.8 08/04/2019   MCV 92 08/04/2019   PLT 257 08/04/2019   Lab Results  Component Value Date   ALT 15 08/16/2019   AST 21 08/16/2019   ALKPHOS 34 (L) 08/16/2019   BILITOT 0.3 08/16/2019     Review of Systems  Constitutional: Negative.  Negative for chills, diaphoresis, fatigue, fever and unexpected weight change.  HENT: Positive for congestion and sinus pressure. Negative for ear discharge, ear pain, hoarse voice, nosebleeds, postnasal drip, rhinorrhea, sneezing and sore throat.   Eyes: Negative for photophobia, pain, discharge, redness and itching.  Respiratory: Negative for cough, shortness of breath, wheezing and stridor.   Cardiovascular: Negative for chest pain, palpitations and leg swelling.   Gastrointestinal: Negative for abdominal pain, blood in stool, constipation, diarrhea, nausea and vomiting.  Endocrine: Negative for cold intolerance, heat intolerance, polydipsia, polyphagia and polyuria.  Genitourinary: Negative for dysuria, flank pain, frequency, hematuria, menstrual problem, pelvic pain, urgency, vaginal bleeding and vaginal discharge.  Musculoskeletal: Negative for arthralgias, back pain, myalgias and neck pain.  Skin: Negative for rash.  Allergic/Immunologic: Negative for environmental allergies and food allergies.  Neurological: Negative for dizziness, weakness, light-headedness, numbness and headaches.  Hematological: Negative for adenopathy. Does not bruise/bleed easily.  Psychiatric/Behavioral: Negative for dysphoric mood. The patient is not nervous/anxious.     Patient Active Problem List   Diagnosis Date Noted  . Aortic atherosclerosis ([place]) 11/05/2018  . COPD, moderate ([place]) 08/23/2015  . Asthma, mild intermittent 06/07/2015  . Hyperlipidemia 06/07/2015    No Known Allergies  Past Surgical History:  Procedure Laterality Date  . ABDOMINAL HYSTERECTOMY    . COLONOSCOPY    . TONSILLECTOMY      Social History   Tobacco Use  . Smoking status: Current Every Day Smoker    Packs/day: 0.25    Years: 50.00    Pack years: 12.50    Types: Cigarettes    Start date: 02/17/1958  . Smokeless tobacco: Never Used  . Tobacco comment: aware needs to stop   Vaping Use  . Vaping Use: Never used  Substance Use Topics  . Alcohol use: Yes    Alcohol/week: 1.0 standard drink  Types: 1 Glasses of wine per week  . Drug use: No     Medication list has been reviewed and updated.  Current Meds  Medication Sig  . aspirin 81 MG tablet Take 81 mg by mouth daily.  . budesonide (ENTOCORT EC) 3 MG 24 hr capsule Take 1 capsule (3 mg total) by mouth in the morning. GI  . calcium carbonate 1250 MG capsule Take 1,250 mg by mouth 2 (two) times daily with a meal.  .  lovastatin (MEVACOR) 20 MG tablet Take 1 tablet (20 mg total) by mouth daily.  . Melatonin 10 MG TABS Take 1 tablet by mouth at bedtime.  . montelukast (SINGULAIR) 10 MG tablet TAKE 1 TABLET(10 MG) BY MOUTH DAILY  . Multiple Vitamins-Minerals (CENTRUM SILVER ADULT 50+ PO) Take 1 capsule by mouth daily at 6 (six) AM.    PHQ 2/9 Scores 06/12/2020 05/21/2020 02/23/2020 09/16/2019  PHQ - 2 Score 0 0 0 0  PHQ- 9 Score 0 - 0 0    GAD 7 : Generalized Anxiety Score 06/12/2020 09/16/2019 07/04/2019  Nervous, Anxious, on Edge 0 0 0  Control/stop worrying 0 0 0  Worry too much - different things 0 0 0  Trouble relaxing 0 0 0  Restless 0 0 0  Easily annoyed or irritable 0 0 0  Afraid - awful might happen 0 0 0  Total GAD 7 Score 0 0 0    BP Readings from Last 3 Encounters:  06/12/20 120/80  05/21/20 (!) 142/86  02/23/20 120/80    Physical Exam Vitals and nursing note reviewed.  Constitutional:      Appearance: She is well-developed.  HENT:     Head: Normocephalic.     Right Ear: Tympanic membrane, ear canal and external ear normal.     Left Ear: Tympanic membrane, ear canal and external ear normal.     Nose: No congestion or rhinorrhea.     Mouth/Throat:     Mouth: Mucous membranes are moist.  Eyes:     General: Lids are everted, no foreign bodies appreciated. No scleral icterus.       Left eye: No foreign body or hordeolum.     Conjunctiva/sclera: Conjunctivae normal.     Right eye: Right conjunctiva is not injected.     Left eye: Left conjunctiva is not injected.     Pupils: Pupils are equal, round, and reactive to light.  Neck:     Thyroid: No thyromegaly.     Vascular: No JVD.     Trachea: No tracheal deviation.  Cardiovascular:     Rate and Rhythm: Normal rate and regular rhythm.     Heart sounds: Normal heart sounds. No murmur heard. No friction rub. No gallop.   Pulmonary:     Effort: Pulmonary effort is normal. No respiratory distress.     Breath sounds: Normal breath  sounds. No stridor. No wheezing, rhonchi or rales.  Chest:     Chest wall: No tenderness.  Abdominal:     General: Bowel sounds are normal.     Palpations: Abdomen is soft. There is no mass.     Tenderness: There is no abdominal tenderness. There is no guarding or rebound.  Musculoskeletal:        General: No tenderness. Normal range of motion.     Cervical back: Normal range of motion and neck supple.  Lymphadenopathy:     Cervical: No cervical adenopathy.  Skin:    General: Skin is warm.  Findings: No rash.  Neurological:     Mental Status: She is alert and oriented to person, place, and time.     Cranial Nerves: No cranial nerve deficit.     Deep Tendon Reflexes: Reflexes normal.  Psychiatric:        Mood and Affect: Mood is not anxious or depressed.     Wt Readings from Last 3 Encounters:  06/12/20 156 lb (70.8 kg)  05/21/20 158 lb 9.6 oz (71.9 kg)  02/23/20 152 lb (68.9 kg)    BP 120/80   Pulse 72   Temp 98.2 F (36.8 C) (Oral)   Ht 5\' 7"  (1.702 m)   Wt 156 lb (70.8 kg)   SpO2 98%   BMI 24.43 kg/m   Assessment and Plan:  1. Acute maxillary sinusitis, recurrence not specified Acute.  Episodic.  Persistent.  Patient continues to have tenderness over the maxillary sinuses bilateral despite finding of a azithromycin dosing.  We will switch over to doxycycline 100 mg twice a day.  Patient is also been given Robitussin-AC for cough.

## 2020-06-13 ENCOUNTER — Telehealth: Payer: Self-pay | Admitting: Gastroenterology

## 2020-06-13 NOTE — Telephone Encounter (Signed)
Patient's PCP RX a cough med that is constipating her along with the Budesonide.  Please call to advise. Does she need a stool softner?

## 2020-06-13 NOTE — Telephone Encounter (Signed)
Called patient and left a detail message  

## 2020-06-25 DIAGNOSIS — M67441 Ganglion, right hand: Secondary | ICD-10-CM | POA: Insufficient documentation

## 2020-06-25 DIAGNOSIS — M79644 Pain in right finger(s): Secondary | ICD-10-CM | POA: Insufficient documentation

## 2020-06-25 DIAGNOSIS — M19041 Primary osteoarthritis, right hand: Secondary | ICD-10-CM | POA: Diagnosis not present

## 2020-06-26 ENCOUNTER — Ambulatory Visit
Admission: RE | Admit: 2020-06-26 | Discharge: 2020-06-26 | Disposition: A | Discharge status: Home / Self Care | Payer: Medicare Other | Source: Ambulatory Visit | Attending: Family Medicine | Admitting: Family Medicine

## 2020-06-26 ENCOUNTER — Other Ambulatory Visit: Payer: Self-pay

## 2020-06-26 DIAGNOSIS — M8589 Other specified disorders of bone density and structure, multiple sites: Secondary | ICD-10-CM | POA: Diagnosis not present

## 2020-06-26 DIAGNOSIS — Z780 Asymptomatic menopausal state: Secondary | ICD-10-CM | POA: Diagnosis not present

## 2020-07-24 ENCOUNTER — Ambulatory Visit: Payer: Medicare Other | Admitting: Gastroenterology

## 2020-08-10 ENCOUNTER — Encounter: Payer: Self-pay | Admitting: Gastroenterology

## 2020-08-10 ENCOUNTER — Other Ambulatory Visit: Payer: Self-pay

## 2020-08-10 ENCOUNTER — Ambulatory Visit: Payer: Medicare Other | Admitting: Gastroenterology

## 2020-08-10 VITALS — BP 132/86 | HR 67 | Temp 97.4°F | Ht 67.0 in | Wt 156.2 lb

## 2020-08-10 DIAGNOSIS — K52831 Collagenous colitis: Secondary | ICD-10-CM | POA: Diagnosis not present

## 2020-08-10 NOTE — Progress Notes (Signed)
Caroline Darby, MD [address]  [place]  [place], [place] [address]  Main: [phone]  Fax: [phone]    Gastroenterology Consultation  Referring Provider:     Juline Patch, MD Primary Care Physician:  Juline Patch, MD Primary Gastroenterologist:  Dr. Vira Agar Reason for Consultation:     Collagenous colitis        HPI:   Caroline Harris is a 75 y.o. female referred by Dr. Juline Patch, MD  for consultation & management of collagenous colitis.  Patient is diagnosed with collagenous colitis based on colonoscopy in 04/2018 after she was having chronic nonbloody diarrhea since 09/2017.  She started on Entocort 9 mg daily which has resulted in resolution of symptoms.  She is currently on 6 mg daily, in clinical remission.  She reports having 1 bowel movement daily.  She does not have any other GI symptoms.  She wanted to discuss about long-term management of collagenous colitis.  Patient is also found to have normocytic anemia.  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  She is currently maintained on budesonide 3 mg daily.  She tried to stop the medication last year which resulted in return of diarrhea.  He does not have any other concerns today.  She wants to know when she is due for next surveillance colonoscopy  NSAIDs: None  Antiplts/Anticoagulants/Anti thrombotics: None  GI Procedures:  Colonoscopy 05/14/18 + Collagenous Colitis, Serrated Polyp, Repeat 5 years noted    Past Medical History:  Diagnosis Date   Collagenous colitis    Diverticulitis    Hyperlipidemia    Osteopenia    Restless leg     Past Surgical History:  Procedure Laterality Date   ABDOMINAL HYSTERECTOMY     COLONOSCOPY     TONSILLECTOMY      Current Outpatient Medications:    aspirin 81 MG tablet, Take 81 mg by mouth daily., Disp: , Rfl:     budesonide (ENTOCORT EC) 3 MG 24 hr capsule, Take 1 capsule (3 mg total) by mouth in the morning. GI, Disp: 30 capsule, Rfl: 11   calcium carbonate 1250 MG capsule, Take 1,250 mg by mouth 2 (two) times daily with a meal., Disp: , Rfl:    lovastatin (MEVACOR) 20 MG tablet, Take 1 tablet (20 mg total) by mouth daily., Disp: 90 tablet, Rfl: 1   Melatonin 10 MG TABS, Take 1 tablet by mouth at bedtime., Disp: , Rfl:    montelukast (SINGULAIR) 10 MG tablet, TAKE 1 TABLET(10 MG) BY MOUTH DAILY, Disp: 90 tablet, Rfl: 1   Multiple Vitamins-Minerals (CENTRUM SILVER ADULT 50+ PO), Take 1 capsule by mouth daily at 6 (six) AM., Disp: , Rfl:    Family History  Problem Relation Age of Onset   Breast cancer Mother 79   Heart attack Father      Social History   Tobacco Use   Smoking status: Every Day    Packs/day: 0.25    Years: 50.00    Pack years: 12.50    Types: Cigarettes    Start date: 02/17/1958   Smokeless tobacco: Never   Tobacco comments:    aware needs to stop   Vaping Use   Vaping Use: Never used  Substance Use Topics   Alcohol use: Yes    Alcohol/week: 1.0 standard drink    Types: 1  Glasses of wine per week   Drug use: No    Allergies as of 08/10/2020   (No Known Allergies)    Review of Systems:    All systems reviewed and negative except where noted in HPI.   Physical Exam:  BP 132/86 (BP Location: Left Arm, Patient Position: Sitting, Cuff Size: Normal)   Pulse 67   Temp (!) 97.4 F (36.3 C)   Ht 5\' 7"  (1.702 m)   Wt 156 lb 4 oz (70.9 kg)   BMI 24.47 kg/m  No LMP recorded. Patient has had a hysterectomy.  General:   Alert,  Well-developed, well-nourished, pleasant and cooperative in NAD Head:  Normocephalic and atraumatic. Eyes:  Sclera clear, no icterus.   Conjunctiva pink. Ears:  Normal auditory acuity. Nose:  No deformity, discharge, or lesions. Mouth:  No deformity or lesions,oropharynx pink & moist. Neck:  Supple; no masses or thyromegaly. Lungs:   Respirations even and unlabored.  Clear throughout to auscultation.   No wheezes, crackles, or rhonchi. No acute distress. Heart:  Regular rate and rhythm; no murmurs, clicks, rubs, or gallops. Abdomen:  Normal bowel sounds. Soft, non-tender and non-distended without masses, hepatosplenomegaly or hernias noted.  No guarding or rebound tenderness.   Rectal: Not performed Msk:  Symmetrical without gross deformities. Good, equal movement & strength bilaterally. Pulses:  Normal pulses noted. Extremities:  No clubbing or edema.  No cyanosis. Neurologic:  Alert and oriented x3;  grossly normal neurologically. Skin:  Intact without significant lesions or rashes. No jaundice. Psych:  Alert and cooperative. Normal mood and affect.  Imaging Studies: Reviewed  Assessment and Plan:   [name] is a 75 y.o. female with history of chronic tobacco use, restless leg syndrome in consultation for collagenous colitis  Collagenous colitis: Currently in clinical remission on budesonide 3 mg daily Continue the current dose Celiac panel negative  Serrated polyp of the colon, less than 1 cm, Recommend surveillance colonoscopy in 04/2023   Follow up annually   Caroline Darby, MD

## 2020-08-24 ENCOUNTER — Encounter: Payer: Self-pay | Admitting: Family Medicine

## 2020-08-24 ENCOUNTER — Ambulatory Visit (INDEPENDENT_AMBULATORY_CARE_PROVIDER_SITE_OTHER): Payer: Medicare Other | Admitting: Family Medicine

## 2020-08-24 ENCOUNTER — Other Ambulatory Visit: Payer: Self-pay

## 2020-08-24 VITALS — BP 120/80 | HR 80 | Ht 67.0 in | Wt 157.0 lb

## 2020-08-24 DIAGNOSIS — J4520 Mild intermittent asthma, uncomplicated: Secondary | ICD-10-CM | POA: Diagnosis not present

## 2020-08-24 DIAGNOSIS — E785 Hyperlipidemia, unspecified: Secondary | ICD-10-CM

## 2020-08-24 MED ORDER — MONTELUKAST SODIUM 10 MG PO TABS: ORAL_TABLET | ORAL | 1 refills | Status: DC

## 2020-08-24 MED ORDER — LOVASTATIN 20 MG PO TABS: 20.0000 mg | ORAL_TABLET | Freq: Every day | ORAL | 1 refills | Status: DC

## 2020-08-24 NOTE — Progress Notes (Signed)
Date:  08/24/2020   Name:  Caroline Harris   DOB:  1945/10/14   MRN:  [id number]   Chief Complaint: Hyperlipidemia and Allergic Rhinitis   Hyperlipidemia The current episode started more than 1 year ago. The problem is controlled. Recent lipid tests were reviewed and are normal. She has no history of diabetes, hypothyroidism or obesity. Pertinent negatives include no chest pain, focal sensory loss, focal weakness, leg pain, myalgias or shortness of breath. Current antihyperlipidemic treatment includes statins. The current treatment provides mild improvement of lipids. There are no compliance problems.   URI  This is a chronic problem. The current episode started more than 1 year ago. The problem has been gradually improving. There has been no fever. Associated symptoms include joint pain. Pertinent negatives include no abdominal pain, chest pain, coughing, diarrhea, dysuria, ear pain, headaches, joint swelling, nausea, neck pain, rash, sore throat or wheezing.   Lab Results  Component Value Date   CREATININE 0.69 08/16/2019   BUN 15 08/16/2019   NA 142 08/16/2019   K 4.8 08/16/2019   CL 103 08/16/2019   CO2 26 08/16/2019   Lab Results  Component Value Date   CHOL 188 08/16/2019   HDL 85 08/16/2019   LDLCALC 86 08/16/2019   TRIG 97 08/16/2019   CHOLHDL 1.7 11/05/2018   Lab Results  Component Value Date   TSH 1.120 06/01/2017   No results found for: HGBA1C Lab Results  Component Value Date   WBC 9.9 08/04/2019   HGB 14.1 08/04/2019   HCT 39.8 08/04/2019   MCV 92 08/04/2019   PLT 257 08/04/2019   Lab Results  Component Value Date   ALT 15 08/16/2019   AST 21 08/16/2019   ALKPHOS 34 (L) 08/16/2019   BILITOT 0.3 08/16/2019     Review of Systems  Constitutional:  Positive for fatigue. Negative for chills and fever.  HENT:  Negative for ear pain and sore throat.   Eyes:  Negative for itching.  Respiratory:  Negative for cough, shortness of breath and wheezing.    Cardiovascular:  Negative for chest pain and leg swelling.  Gastrointestinal:  Negative for abdominal pain, blood in stool, constipation, diarrhea and nausea.  Endocrine: Negative for polydipsia.  Genitourinary:  Negative for dysuria, frequency, hematuria and urgency.  Musculoskeletal:  Positive for joint pain. Negative for back pain, myalgias and neck pain.  Skin:  Negative for rash.  Allergic/Immunologic: Negative for environmental allergies.  Neurological:  Negative for dizziness, focal weakness and headaches.  Hematological:  Does not bruise/bleed easily.  Psychiatric/Behavioral:  Negative for suicidal ideas. The patient is not nervous/anxious.    Patient Active Problem List   Diagnosis Date Noted   Ganglion cyst of finger of right hand 06/25/2020   Pain in finger of right hand 06/25/2020   Aortic atherosclerosis ([place]) 11/05/2018   COPD, moderate ([place]) 08/23/2015   Asthma, mild intermittent 06/07/2015   Hyperlipidemia 06/07/2015    No Known Allergies  Past Surgical History:  Procedure Laterality Date   ABDOMINAL HYSTERECTOMY     COLONOSCOPY     TONSILLECTOMY      Social History   Tobacco Use   Smoking status: Every Day    Packs/day: 0.25    Years: 50.00    Pack years: 12.50    Types: Cigarettes    Start date: 02/17/1958   Smokeless tobacco: Never   Tobacco comments:    aware needs to stop   Vaping Use   Vaping Use:  Never used  Substance Use Topics   Alcohol use: Yes    Alcohol/week: 1.0 standard drink    Types: 1 Glasses of wine per week   Drug use: No     Medication list has been reviewed and updated.  Current Meds  Medication Sig   aspirin 81 MG tablet Take 81 mg by mouth daily.   budesonide (ENTOCORT EC) 3 MG 24 hr capsule Take 1 capsule (3 mg total) by mouth in the morning. GI   calcium carbonate 1250 MG capsule Take 1,250 mg by mouth 2 (two) times daily with a meal.   lovastatin (MEVACOR) 20 MG tablet Take 1 tablet (20 mg total) by mouth daily.    Melatonin 10 MG TABS Take 1 tablet by mouth at bedtime.   montelukast (SINGULAIR) 10 MG tablet TAKE 1 TABLET(10 MG) BY MOUTH DAILY   Multiple Vitamins-Minerals (CENTRUM SILVER ADULT 50+ PO) Take 1 capsule by mouth daily at 6 (six) AM.    PHQ 2/9 Scores 08/24/2020 06/12/2020 05/21/2020 02/23/2020  PHQ - 2 Score 0 0 0 0  PHQ- 9 Score 0 0 - 0    GAD 7 : Generalized Anxiety Score 08/24/2020 06/12/2020 09/16/2019 07/04/2019  Nervous, Anxious, on Edge 0 0 0 0  Control/stop worrying 0 0 0 0  Worry too much - different things 0 0 0 0  Trouble relaxing 0 0 0 0  Restless 0 0 0 0  Easily annoyed or irritable 0 0 0 0  Afraid - awful might happen 0 0 0 0  Total GAD 7 Score 0 0 0 0    BP Readings from Last 3 Encounters:  08/24/20 120/80  08/10/20 132/86  06/12/20 120/80    Physical Exam Vitals and nursing note reviewed.  Constitutional:      General: She is not in acute distress.    Appearance: She is not diaphoretic.  HENT:     Head: Normocephalic and atraumatic.     Right Ear: Tympanic membrane and external ear normal.     Left Ear: Tympanic membrane and external ear normal.     Nose: Nose normal.  Eyes:     General:        Right eye: No discharge.        Left eye: No discharge.     Conjunctiva/sclera: Conjunctivae normal.     Pupils: Pupils are equal, round, and reactive to light.  Neck:     Thyroid: No thyromegaly.     Vascular: No JVD.  Cardiovascular:     Rate and Rhythm: Normal rate and regular rhythm.     Heart sounds: Normal heart sounds, S1 normal and S2 normal. No murmur heard. No systolic murmur is present.  No diastolic murmur is present.    No friction rub. No gallop. No S3 or S4 sounds.  Pulmonary:     Effort: Pulmonary effort is normal.     Breath sounds: Normal breath sounds. No decreased breath sounds, wheezing, rhonchi or rales.  Abdominal:     General: Bowel sounds are normal.     Palpations: Abdomen is soft. There is no mass.     Tenderness: There is no abdominal  tenderness. There is no guarding.  Musculoskeletal:        General: Normal range of motion.     Cervical back: Normal range of motion and neck supple.     Right lower leg: No edema.     Left lower leg: No edema.  Lymphadenopathy:  Cervical: No cervical adenopathy.  Skin:    General: Skin is warm and dry.  Neurological:     Mental Status: She is alert.     Deep Tendon Reflexes: Reflexes are normal and symmetric.    Wt Readings from Last 3 Encounters:  08/24/20 157 lb (71.2 kg)  08/10/20 156 lb 4 oz (70.9 kg)  06/12/20 156 lb (70.8 kg)    BP 120/80   Pulse 80   Ht 5\' 7"  (1.702 m)   Wt 157 lb (71.2 kg)   BMI 24.59 kg/m   Assessment and Plan:  1. Hyperlipidemia, unspecified hyperlipidemia type Chronic.  Controlled.  Stable.  Uncomplicated.  Will check CMP and lipid panel for level of control of LDL and rule out any hepatotoxicity due to medication.  We will continue lovastatin 20 mg once a day. - lovastatin (MEVACOR) 20 MG tablet; Take 1 tablet (20 mg total) by mouth daily.  Dispense: 90 tablet; Refill: 1 - Comprehensive Metabolic Panel (CMET) - Lipid Panel With LDL/HDL Ratio  2. Mild intermittent asthma, unspecified whether complicated Chronic.  Controlled.  Stable.  Continue Singulair 10 mg once a day. - montelukast (SINGULAIR) 10 MG tablet; TAKE 1 TABLET(10 MG) BY MOUTH DAILY  Dispense: 90 tablet; Refill: 1

## 2020-08-25 LAB — COMPREHENSIVE METABOLIC PANEL
ALT: 17 IU/L (ref 0–32)
AST: 23 IU/L (ref 0–40)
Albumin/Globulin Ratio: 2 (ref 1.2–2.2)
Albumin: 4.3 g/dL (ref 3.7–4.7)
Alkaline Phosphatase: 38 IU/L — ABNORMAL LOW (ref 44–121)
BUN/Creatinine Ratio: 16 (ref 12–28)
BUN: 12 mg/dL (ref 8–27)
Bilirubin Total: 0.4 mg/dL (ref 0.0–1.2)
CO2: 26 mmol/L (ref 20–29)
Calcium: 9.3 mg/dL (ref 8.7–10.3)
Chloride: 101 mmol/L (ref 96–106)
Creatinine, Ser: 0.74 mg/dL (ref 0.57–1.00)
Globulin, Total: 2.1 g/dL (ref 1.5–4.5)
Glucose: 87 mg/dL (ref 65–99)
Potassium: 4.4 mmol/L (ref 3.5–5.2)
Sodium: 140 mmol/L (ref 134–144)
Total Protein: 6.4 g/dL (ref 6.0–8.5)
eGFR: 84 mL/min/{1.73_m2} (ref >59)

## 2020-08-25 LAB — LIPID PANEL WITH LDL/HDL RATIO
Cholesterol, Total: 187 mg/dL (ref 100–199)
HDL: 86 mg/dL (ref >39)
LDL Chol Calc (NIH): 86 mg/dL (ref 0–99)
LDL/HDL Ratio: 1 ratio (ref 0.0–3.2)
Triglycerides: 84 mg/dL (ref 0–149)
VLDL Cholesterol Cal: 15 mg/dL (ref 5–40)

## 2020-09-05 DIAGNOSIS — Z86018 Personal history of other benign neoplasm: Secondary | ICD-10-CM | POA: Diagnosis not present

## 2020-09-05 DIAGNOSIS — Z872 Personal history of diseases of the skin and subcutaneous tissue: Secondary | ICD-10-CM | POA: Diagnosis not present

## 2020-09-05 DIAGNOSIS — L578 Other skin changes due to chronic exposure to nonionizing radiation: Secondary | ICD-10-CM | POA: Diagnosis not present

## 2020-09-05 DIAGNOSIS — Z85828 Personal history of other malignant neoplasm of skin: Secondary | ICD-10-CM | POA: Diagnosis not present

## 2020-09-05 DIAGNOSIS — D485 Neoplasm of uncertain behavior of skin: Secondary | ICD-10-CM | POA: Diagnosis not present

## 2020-09-05 DIAGNOSIS — C44311 Basal cell carcinoma of skin of nose: Secondary | ICD-10-CM | POA: Diagnosis not present

## 2020-09-12 DIAGNOSIS — C44311 Basal cell carcinoma of skin of nose: Secondary | ICD-10-CM | POA: Diagnosis not present

## 2020-09-17 DIAGNOSIS — Z48817 Encounter for surgical aftercare following surgery on the skin and subcutaneous tissue: Secondary | ICD-10-CM | POA: Diagnosis not present

## 2020-10-10 DIAGNOSIS — Z48817 Encounter for surgical aftercare following surgery on the skin and subcutaneous tissue: Secondary | ICD-10-CM | POA: Diagnosis not present

## 2020-10-11 ENCOUNTER — Telehealth: Payer: Self-pay | Admitting: Gastroenterology

## 2020-10-11 NOTE — Telephone Encounter (Signed)
Woke up at 2am with diarrhea and messed up everything. Patient states the abdominal pain is in her left side and is a nagging pain. She states since Tuesday she has had a gassy feeling in her stomach. She had diarrhea early this morning 2 or 3 times and took imodium around 4:00 this morning and has not had it since. Has been on Budesonide for over 2 years. Denies any blood in stool

## 2020-10-11 NOTE — Telephone Encounter (Signed)
Patient verbalized understand of instructions. She will call us back in 1 week to let us know how she is feeling

## 2020-10-11 NOTE — Telephone Encounter (Signed)
LVM that she is having Colitis symptoms and abd pain. What can she do?

## 2020-10-11 NOTE — Telephone Encounter (Signed)
Based on my last encounter with her in office, she was taking budesonide 3 mg 1 pill daily.  I recommended her to increase to 3 pills daily at least for 1 week and see if her diarrhea improves.  If it does not improve, recommend to check stool studies to rule out infection  RV

## 2020-10-24 DIAGNOSIS — Z48817 Encounter for surgical aftercare following surgery on the skin and subcutaneous tissue: Secondary | ICD-10-CM | POA: Diagnosis not present

## 2020-10-29 ENCOUNTER — Ambulatory Visit (INDEPENDENT_AMBULATORY_CARE_PROVIDER_SITE_OTHER): Payer: Medicare Other | Admitting: Family Medicine

## 2020-10-29 ENCOUNTER — Other Ambulatory Visit: Payer: Self-pay

## 2020-10-29 ENCOUNTER — Telehealth: Payer: Self-pay

## 2020-10-29 ENCOUNTER — Encounter: Payer: Self-pay | Admitting: Family Medicine

## 2020-10-29 VITALS — BP 100/70 | HR 84 | Ht 67.0 in | Wt 154.0 lb

## 2020-10-29 DIAGNOSIS — R110 Nausea: Secondary | ICD-10-CM

## 2020-10-29 DIAGNOSIS — E860 Dehydration: Secondary | ICD-10-CM

## 2020-10-29 DIAGNOSIS — G4452 New daily persistent headache (NDPH): Secondary | ICD-10-CM | POA: Diagnosis not present

## 2020-10-29 MED ORDER — ONDANSETRON HCL 8 MG PO TABS: 8.0000 mg | ORAL_TABLET | Freq: Three times a day (TID) | ORAL | 0 refills | Status: DC | PRN

## 2020-10-29 MED ORDER — KETOROLAC TROMETHAMINE 60 MG/2ML IM SOLN
60.0000 mg | Freq: Once | INTRAMUSCULAR | Status: AC
  Administered 2020-10-29: 60 mg via INTRAMUSCULAR

## 2020-10-29 MED ORDER — KETOROLAC TROMETHAMINE 60 MG/2ML IM SOLN: 60.0000 mg | Freq: Once | INTRAMUSCULAR | Status: DC

## 2020-10-29 NOTE — Progress Notes (Signed)
Date:  10/29/2020   Name:  Caroline Harris   DOB:  [birth year]   MRN:  [id number]   Chief Complaint: Headache (Started with headache last Wednesday- then had diarrhea and nausea. Headache is constant. Has taken 3 COVID test, last one yesterday- negative. No fever)  Headache  This is a new problem. The current episode started in the past 7 days. The problem occurs constantly. The problem has been unchanged. The pain is located in the Frontal and bilateral region. The pain does not radiate. The pain quality is not similar to prior headaches. The quality of the pain is described as band-like. The pain is at a severity of 3/10. The pain is mild. Associated symptoms include abdominal pain, nausea, vomiting and weakness. Pertinent negatives include no abnormal behavior, anorexia, back pain, blurred vision, coughing, dizziness, drainage, ear pain, eye pain, eye redness, eye watering, facial sweating, fever, hearing loss, insomnia, loss of balance, muscle aches, neck pain, numbness, phonophobia, photophobia, rhinorrhea, scalp tenderness, seizures, sinus pressure, sore throat, swollen glands, tingling, tinnitus, visual change or weight loss. Nothing aggravates the symptoms. She has tried NSAIDs (pepcid AC) for the symptoms. The treatment provided no relief (says to take before eating- she is not eating). There is no history of cluster headaches, hypertension, migraine headaches or migraines in the family.  Emesis  This is a new problem. The current episode started in the past 7 days. Episode frequency: constant. The problem has been unchanged. There has been no fever. Associated symptoms include abdominal pain, diarrhea and headaches. Pertinent negatives include no chills, coughing, dizziness, fever, URI or weight loss. She has tried acetaminophen (nsaid) for the symptoms. The treatment provided no relief.   Lab Results  Component Value Date   CREATININE 0.74 08/24/2020   BUN 12 08/24/2020   NA 140 08/24/2020    K 4.4 08/24/2020   CL 101 08/24/2020   CO2 26 08/24/2020   Lab Results  Component Value Date   CHOL 187 08/24/2020   HDL 86 08/24/2020   LDLCALC 86 08/24/2020   TRIG 84 08/24/2020   CHOLHDL 1.7 11/05/2018   Lab Results  Component Value Date   TSH 1.120 06/01/2017   No results found for: HGBA1C Lab Results  Component Value Date   WBC 9.9 08/04/2019   HGB 14.1 08/04/2019   HCT 39.8 08/04/2019   MCV 92 08/04/2019   PLT 257 08/04/2019   Lab Results  Component Value Date   ALT 17 08/24/2020   AST 23 08/24/2020   ALKPHOS 38 (L) 08/24/2020   BILITOT 0.4 08/24/2020     Review of Systems  Constitutional:  Negative for chills, fever and weight loss.  HENT:  Negative for ear pain, hearing loss, rhinorrhea, sinus pressure, sore throat and tinnitus.   Eyes:  Negative for blurred vision, photophobia, pain and redness.  Respiratory:  Negative for cough and wheezing.   Gastrointestinal:  Positive for abdominal pain, diarrhea, nausea and vomiting. Negative for anorexia.  Musculoskeletal:  Negative for back pain and neck pain.  Neurological:  Positive for weakness and headaches. Negative for dizziness, tingling, seizures, numbness and loss of balance.  Psychiatric/Behavioral:  The patient does not have insomnia.    Patient Active Problem List   Diagnosis Date Noted   Ganglion cyst of finger of right hand 06/25/2020   Pain in finger of right hand 06/25/2020   Aortic atherosclerosis ([place]) 11/05/2018   COPD, moderate ([place]) 08/23/2015   Asthma, mild intermittent 06/07/2015   Hyperlipidemia  06/07/2015    No Known Allergies  Past Surgical History:  Procedure Laterality Date   ABDOMINAL HYSTERECTOMY     COLONOSCOPY     TONSILLECTOMY      Social History   Tobacco Use   Smoking status: Every Day    Packs/day: 0.25    Years: 50.00    Pack years: 12.50    Types: Cigarettes    Start date: 02/17/1958   Smokeless tobacco: Never   Tobacco comments:    aware needs to stop    Vaping Use   Vaping Use: Never used  Substance Use Topics   Alcohol use: Yes    Alcohol/week: 1.0 standard drink    Types: 1 Glasses of wine per week   Drug use: No     Medication list has been reviewed and updated.  Current Meds  Medication Sig   aspirin 81 MG tablet Take 81 mg by mouth daily.   budesonide (ENTOCORT EC) 3 MG 24 hr capsule Take 1 capsule (3 mg total) by mouth in the morning. GI   calcium carbonate 1250 MG capsule Take 1,250 mg by mouth 2 (two) times daily with a meal.   lovastatin (MEVACOR) 20 MG tablet Take 1 tablet (20 mg total) by mouth daily.   Melatonin 10 MG TABS Take 1 tablet by mouth at bedtime.   montelukast (SINGULAIR) 10 MG tablet TAKE 1 TABLET(10 MG) BY MOUTH DAILY   Multiple Vitamins-Minerals (CENTRUM SILVER ADULT 50+ PO) Take 1 capsule by mouth daily at 6 (six) AM.    PHQ 2/9 Scores 08/24/2020 06/12/2020 05/21/2020 02/23/2020  PHQ - 2 Score 0 0 0 0  PHQ- 9 Score 0 0 - 0    GAD 7 : Generalized Anxiety Score 08/24/2020 06/12/2020 09/16/2019 07/04/2019  Nervous, Anxious, on Edge 0 0 0 0  Control/stop worrying 0 0 0 0  Worry too much - different things 0 0 0 0  Trouble relaxing 0 0 0 0  Restless 0 0 0 0  Easily annoyed or irritable 0 0 0 0  Afraid - awful might happen 0 0 0 0  Total GAD 7 Score 0 0 0 0    BP Readings from Last 3 Encounters:  08/24/20 120/80  08/10/20 132/86  06/12/20 120/80    Physical Exam Vitals and nursing note reviewed.  HENT:     Head: Normocephalic and atraumatic.     Mouth/Throat:     Lips: Pink.     Mouth: Mucous membranes are dry.  Eyes:     Extraocular Movements: Extraocular movements intact.     Pupils: Pupils are equal, round, and reactive to light. Pupils are equal.  Cardiovascular:     Heart sounds: S1 normal and S2 normal. No murmur heard. No systolic murmur is present.  No diastolic murmur is present.    No S3 or S4 sounds.  Pulmonary:     Breath sounds: Normal breath sounds. No decreased breath sounds,  wheezing, rhonchi or rales.  Musculoskeletal:     Right lower leg: No edema.     Left lower leg: No edema.  Neurological:     Mental Status: She is alert.     Cranial Nerves: Cranial nerves are intact. No cranial nerve deficit or facial asymmetry.     Sensory: Sensation is intact.     Motor: Motor function is intact.     Deep Tendon Reflexes:     Reflex Scores:      Tricep reflexes are 2+ on the  right side and 2+ on the left side.      Bicep reflexes are 2+ on the right side and 2+ on the left side.      Brachioradialis reflexes are 2+ on the right side and 2+ on the left side.      Patellar reflexes are 2+ on the right side and 2+ on the left side.      Achilles reflexes are 2+ on the right side and 2+ on the left side.   Wt Readings from Last 3 Encounters:  10/29/20 154 lb (69.9 kg)  08/24/20 157 lb (71.2 kg)  08/10/20 156 lb 4 oz (70.9 kg)    Ht '5\' 7"'$  (1.702 m)   Wt 154 lb (69.9 kg)   BMI 24.12 kg/m   Assessment and Plan:  1. New daily persistent headache New headache.  Patient has a history of headaches but was evaluated for occipital headaches in the past.  Persistent for 4 days.  Stable.  No neurologic deficit.  Patient has had a persistent headache unresolved on acetaminophen and NSAID.  We will initiate with a Toradol injection 60 mg IM.  If pain is unresolved in the next 6 to 12 hours patient is to go to the emergency room for evaluation and further treatment. - ketorolac (TORADOL) injection 60 mg  2. Nausea New onset.  Persistent.  Patient has had 1 episode of emesis but continues to have nausea with decreased oral intake of food and inability to take adequate fluids.  We will treat with Zofran 8 mg 1 every 8 hours for nausea and vomiting. - ondansetron (ZOFRAN) 8 MG tablet; Take 1 tablet (8 mg total) by mouth every 8 (eight) hours as needed for nausea or vomiting.  Dispense: 20 tablet; Refill: 0  3. Dehydration Resultant of the above circumstances patient is unable  to keep anything down in the GI tract.  Patient has nausea with discomfort in the abdominal.  She does have a history of colitis and if continue we will be referring back to GI or to an ER for IV hydration and control of nausea.

## 2020-10-29 NOTE — Telephone Encounter (Signed)
Called patient and spoke with her. Checked up on her from today's visit with Dr Ronnald Ramp to see how she is feeling post injection and she said her headache is slightly better than this morning but still lingering. Nausea still present. Resting and staying hydrated. Told her to call us with any questions or concerns.

## 2020-10-30 ENCOUNTER — Encounter: Payer: Self-pay | Admitting: Family Medicine

## 2020-10-31 ENCOUNTER — Encounter: Payer: Self-pay | Admitting: Gastroenterology

## 2020-10-31 ENCOUNTER — Telehealth: Payer: Self-pay | Admitting: Gastroenterology

## 2020-10-31 NOTE — Telephone Encounter (Signed)
Pt. Requesting a call back she said she is severely constipated

## 2020-11-01 DIAGNOSIS — M810 Age-related osteoporosis without current pathological fracture: Secondary | ICD-10-CM | POA: Diagnosis not present

## 2020-11-01 DIAGNOSIS — R519 Headache, unspecified: Secondary | ICD-10-CM | POA: Diagnosis not present

## 2020-11-01 NOTE — Telephone Encounter (Signed)
Patient also sent a mychart message. Dr. Marius Ditch stated  fleet enema and a bottle of magnesium citrate today. Informed patient by mychart. Called patient and patient verbalized understanding of instructions she going to take medication after she gets back from her doctor appointment

## 2020-11-02 ENCOUNTER — Telehealth: Payer: Self-pay | Admitting: Gastroenterology

## 2020-11-02 ENCOUNTER — Other Ambulatory Visit: Payer: Self-pay | Admitting: Neurology

## 2020-11-02 DIAGNOSIS — R519 Headache, unspecified: Secondary | ICD-10-CM

## 2020-11-02 NOTE — Telephone Encounter (Signed)
Patient verbalized understanding and will try taking the miralax

## 2020-11-02 NOTE — Telephone Encounter (Signed)
Patient she states the magnesium Citrate is it recalled and can not find it. She did the fleet enema yesterday. She states she did have good a bowel movement around 7:00pm. She states she was having some abdominal cramping last night but not currently. She states now she feels like she has to go but can not have a bowel movement. She states she does not know if she need to take something else. She has not taken the Budesonide since Wednesday

## 2020-11-02 NOTE — Telephone Encounter (Signed)
Pt. Calling she has a question about the meds that were prescribed for her constipation.

## 2020-11-02 NOTE — Telephone Encounter (Signed)
Start MiraLAX 1-2 capfuls daily and see if it helps.  Hold off on budesonide for now.   RV

## 2020-11-06 ENCOUNTER — Encounter: Payer: Self-pay | Admitting: Gastroenterology

## 2020-11-12 ENCOUNTER — Encounter: Payer: Self-pay | Admitting: Family Medicine

## 2020-11-13 ENCOUNTER — Ambulatory Visit
Admission: RE | Admit: 2020-11-13 | Discharge: 2020-11-13 | Disposition: A | Discharge status: Home / Self Care | Payer: Medicare Other | Source: Ambulatory Visit | Attending: Neurology | Admitting: Neurology

## 2020-11-13 ENCOUNTER — Other Ambulatory Visit: Payer: Self-pay

## 2020-11-13 DIAGNOSIS — R519 Headache, unspecified: Secondary | ICD-10-CM

## 2020-11-13 DIAGNOSIS — I6782 Cerebral ischemia: Secondary | ICD-10-CM | POA: Insufficient documentation

## 2020-11-29 ENCOUNTER — Encounter: Payer: Self-pay | Admitting: Gastroenterology

## 2020-11-29 DIAGNOSIS — R519 Headache, unspecified: Secondary | ICD-10-CM | POA: Diagnosis not present

## 2020-11-29 DIAGNOSIS — Z8673 Personal history of transient ischemic attack (TIA), and cerebral infarction without residual deficits: Secondary | ICD-10-CM | POA: Diagnosis not present

## 2020-11-30 ENCOUNTER — Other Ambulatory Visit: Payer: Self-pay | Admitting: Gastroenterology

## 2021-01-03 ENCOUNTER — Encounter: Payer: Self-pay | Admitting: Emergency Medicine

## 2021-01-03 ENCOUNTER — Ambulatory Visit: Payer: Self-pay | Admitting: *Deleted

## 2021-01-03 ENCOUNTER — Other Ambulatory Visit: Payer: Self-pay

## 2021-01-03 ENCOUNTER — Ambulatory Visit
Admission: EM | Admit: 2021-01-03 | Discharge: 2021-01-03 | Disposition: A | Discharge status: Home / Self Care | Payer: Medicare Other | Attending: Emergency Medicine | Admitting: Emergency Medicine

## 2021-01-03 DIAGNOSIS — E785 Hyperlipidemia, unspecified: Secondary | ICD-10-CM | POA: Diagnosis not present

## 2021-01-03 DIAGNOSIS — B349 Viral infection, unspecified: Secondary | ICD-10-CM | POA: Insufficient documentation

## 2021-01-03 DIAGNOSIS — Z20822 Contact with and (suspected) exposure to covid-19: Secondary | ICD-10-CM | POA: Diagnosis not present

## 2021-01-03 DIAGNOSIS — F17210 Nicotine dependence, cigarettes, uncomplicated: Secondary | ICD-10-CM | POA: Insufficient documentation

## 2021-01-03 DIAGNOSIS — R059 Cough, unspecified: Secondary | ICD-10-CM | POA: Diagnosis present

## 2021-01-03 MED ORDER — IBUPROFEN 800 MG PO TABS: 800.0000 mg | ORAL_TABLET | Freq: Three times a day (TID) | ORAL | 0 refills | Status: DC

## 2021-01-03 NOTE — Telephone Encounter (Signed)
2nd attempt to reach pt. Left VM to call back.

## 2021-01-03 NOTE — Telephone Encounter (Signed)
Pt called back in, stating she is having severe headache, body aches and chills, that started last night. She now has a cough as well and is wondering if possibly she has the flu. Pt states she has had a headache like this before and went to Neurology and they advised her if she has another headache like this then to go to ED. Scheduled pt for mychart VV tomorrow at 1120 with Dr. Ronnald Ramp and gave care advice but advised pt to call neurology office to notify them of headache as well just to be on safe side. Advised if tylenol doesn't help and symptoms get worse to go to ER. Pt verbalized understanding. No other questions/concerns noted.    Message from Scherrie Gerlach sent at 01/03/2021  8:07 AM EST  Pt states she has headache, body aches, chills, just had a bad night. Would like to know if she should come in to see Dr Ronnald Ramp?     Reason for Disposition  [1] MODERATE headache (e.g., interferes with normal activities) AND [2] present > 24 hours AND [3] unexplained  (Exceptions: analgesics not tried, typical migraine, or headache part of viral illness)  Answer Assessment - Initial Assessment Questions 1. LOCATION: "Where does it hurt?"      forehead 2. ONSET: "When did the headache start?" (Minutes, hours or days)      Last night 3. PATTERN: "Does the pain come and go, or has it been constant since it started?"     constant 4. SEVERITY: "How bad is the pain?" and "What does it keep you from doing?"  (e.g., Scale 1-10; mild, moderate, or severe)   - MILD (1-3): doesn't interfere with normal activities    - MODERATE (4-7): interferes with normal activities or awakens from sleep    - SEVERE (8-10): excruciating pain, unable to do any normal activities        7 5. RECURRENT SYMPTOM: "Have you ever had headaches before?" If Yes, ask: "When was the last time?" and "What happened that time?"      Yes similar to when she seen Neuro 6. CAUSE: "What do you think is causing the headache?"     Unsure if flu  or if need to f/up with Neuro  7. MIGRAINE: "Have you been diagnosed with migraine headaches?" If Yes, ask: "Is this headache similar?"      No 8. HEAD INJURY: "Has there been any recent injury to the head?"      No 9. OTHER SYMPTOMS: "Do you have any other symptoms?" (fever, stiff neck, eye pain, sore throat, cold symptoms)     Chills, body aches, cough,  10. PREGNANCY: "Is there any chance you are pregnant?" "When was your last menstrual period?"       No  Protocols used: Headache-A-AH

## 2021-01-03 NOTE — ED Provider Notes (Signed)
[facility]    CSN: [id number] Arrival date & time: 01/03/21  1422      History   Chief Complaint Chief Complaint  Patient presents with   Cough   Fever   Headache    HPI RIDHIMA GOLBERG is a 75 y.o. female.   Patient presents with nonproductive cough, fever, chills and intermittent generalized headache for 1 day.  No known sick contacts.  Attempted use of guaifenesin, tylenol, ibuprofen with some relief.  Denies nasal congestion, rhinorrhea, sore throat, ear pain, shortness of breath, wheezing, abdominal pain, nausea, vomiting, diarrhea.  History of hyperlipidemia, diverticulitis, restless leg syndrome and headache disorder.  Past Medical History:  Diagnosis Date   Collagenous colitis    Diverticulitis    Hyperlipidemia    Osteopenia    Restless leg     Patient Active Problem List   Diagnosis Date Noted   Ganglion cyst of finger of right hand 06/25/2020   Pain in finger of right hand 06/25/2020   Aortic atherosclerosis ([place]) 11/05/2018   COPD, moderate ([place]) 08/23/2015   Asthma, mild intermittent 06/07/2015   Hyperlipidemia 06/07/2015    Past Surgical History:  Procedure Laterality Date   ABDOMINAL HYSTERECTOMY     COLONOSCOPY     TONSILLECTOMY      OB History   No obstetric history on file.      Home Medications    Prior to Admission medications   Medication Sig Start Date End Date Taking? Authorizing Provider  aspirin 81 MG tablet Take 81 mg by mouth daily.    [provider]  budesonide (ENTOCORT EC) 3 MG 24 hr capsule TAKE ONE CAPSULE BY MOUTH EVERY MORNING 11/30/20   Vanga, Tally Due, MD  calcium carbonate 1250 MG capsule Take 1,250 mg by mouth 2 (two) times daily with a meal.    [provider]  lovastatin (MEVACOR) 20 MG tablet Take 1 tablet (20 mg total) by mouth daily. 08/24/20   Juline Patch, MD  Melatonin 10 MG TABS Take 1 tablet by mouth at bedtime.    [provider]  montelukast (SINGULAIR) 10 MG  tablet TAKE 1 TABLET(10 MG) BY MOUTH DAILY 08/24/20   Juline Patch, MD  Multiple Vitamins-Minerals (CENTRUM SILVER ADULT 50+ PO) Take 1 capsule by mouth daily at 6 (six) AM.    [provider]  ondansetron (ZOFRAN) 8 MG tablet Take 1 tablet (8 mg total) by mouth every 8 (eight) hours as needed for nausea or vomiting. 10/29/20   Juline Patch, MD    Family History Family History  Problem Relation Age of Onset   Breast cancer Mother 12   Heart attack Father     Social History Social History   Tobacco Use   Smoking status: Every Day    Packs/day: 0.25    Years: 50.00    Pack years: 12.50    Types: Cigarettes    Start date: 02/17/1958   Smokeless tobacco: Never   Tobacco comments:    aware needs to stop   Vaping Use   Vaping Use: Never used  Substance Use Topics   Alcohol use: Yes    Alcohol/week: 1.0 standard drink    Types: 1 Glasses of wine per week   Drug use: No     Allergies   Patient has no known allergies.   Review of Systems Review of Systems  Constitutional:  Positive for appetite change, chills and fever. Negative for activity change, diaphoresis, fatigue and  unexpected weight change.  HENT:  Negative for congestion, dental problem, drooling, ear discharge, ear pain, facial swelling, hearing loss, mouth sores, nosebleeds, postnasal drip, rhinorrhea, sinus pressure, sinus pain, sneezing, sore throat, tinnitus, trouble swallowing and voice change.   Respiratory:  Positive for cough. Negative for apnea, choking, chest tightness, shortness of breath, wheezing and stridor.   Cardiovascular: Negative.   Gastrointestinal: Negative.   Skin: Negative.   Neurological:  Positive for headaches. Negative for dizziness, tremors, seizures, syncope, facial asymmetry, speech difficulty, weakness, light-headedness and numbness.    Physical Exam Triage Vital Signs ED Triage Vitals  Enc Vitals Group     BP 01/03/21 1509 132/75     Pulse Rate 01/03/21 1509 70      Resp 01/03/21 1509 18     Temp 01/03/21 1509 98.7 F (37.1 C)     Temp Source 01/03/21 1509 Oral     SpO2 01/03/21 1509 95 %     Weight --      Height --      Head Circumference --      Peak Flow --      Pain Score 01/03/21 1512 5     Pain Loc --      Pain Edu? --      Excl. in [place]? --    No data found.  Updated Vital Signs BP 132/75 (BP Location: Left Arm)   Pulse 70   Temp 98.7 F (37.1 C) (Oral)   Resp 18   SpO2 95%   Visual Acuity Right Eye Distance:   Left Eye Distance:   Bilateral Distance:    Right Eye Near:   Left Eye Near:    Bilateral Near:     Physical Exam Constitutional:      Appearance: Normal appearance. She is well-developed and normal weight.  HENT:     Head: Normocephalic.     Right Ear: Tympanic membrane, ear canal and external ear normal.     Left Ear: Tympanic membrane, ear canal and external ear normal.     Nose: Nose normal.     Mouth/Throat:     Mouth: Mucous membranes are moist.     Pharynx: Posterior oropharyngeal erythema present.  Eyes:     Extraocular Movements: Extraocular movements intact.  Cardiovascular:     Rate and Rhythm: Normal rate and regular rhythm.     Pulses: Normal pulses.     Heart sounds: Normal heart sounds.  Pulmonary:     Effort: Pulmonary effort is normal.     Breath sounds: Normal breath sounds.  Musculoskeletal:     Cervical back: Normal range of motion and neck supple.  Lymphadenopathy:     Cervical: Cervical adenopathy present.  Skin:    General: Skin is warm and dry.  Neurological:     Mental Status: She is alert and oriented to person, place, and time. Mental status is at baseline.  Psychiatric:        Mood and Affect: Mood normal.        Behavior: Behavior normal.     UC Treatments / Results  Labs (all labs ordered are listed, but only abnormal results are displayed) Labs Reviewed  SARS CORONAVIRUS 2 (TAT 6-24 HRS)    EKG   Radiology No results found.  Procedures Procedures  (including critical care time)  Medications Ordered in UC Medications - No data to display  Initial Impression / Assessment and Plan / UC Course  I have reviewed the triage vital signs  and the nursing notes.  Pertinent labs & imaging results that were available during my care of the patient were reviewed by me and considered in my medical decision making (see chart for details).  Viral illness  1. COVID and flu test pending 2.  Ibuprofen 800 mg 3 times daily as needed 3.  Over-the-counter medication for remaining symptom management 6 4.  Urgent care follow-up as needed Final Clinical Impressions(s) / UC Diagnoses   Final diagnoses:  None   Discharge Instructions   None    ED Prescriptions   None    PDMP not reviewed this encounter.   Hans Eden, NP 01/03/21 1620

## 2021-01-03 NOTE — ED Triage Notes (Signed)
Cough, headache and fever that began yesterday

## 2021-01-03 NOTE — Telephone Encounter (Signed)
3 attempts to reach pt, left VM each attempt to return call to discuss symptoms. Unable to reach pt after 3 attempts by PEC NT. Routing to provider for resolution per protocol.

## 2021-01-03 NOTE — Telephone Encounter (Signed)
Informed patient she will need to go to urgent care or ER for treatment.

## 2021-01-03 NOTE — Telephone Encounter (Signed)
Pt states she has headache, body aches, chills, just had a bad night. Would like to know if she should come in to see Dr Ronnald Ramp

## 2021-01-03 NOTE — Discharge Instructions (Addendum)
We will contact you if your COVID of flu test is positive.  Please quarantine while you wait for the results.  If your test is negative you may resume normal activities.  If your test is positive please continue to quarantine for at least 5 days from your symptom onset or until you are without a fever for at least 24 hours after the medications.    You can take Tylenol and/or Ibuprofen as needed for fever reduction and pain relief.   For cough: honey 1/2 to 1 teaspoon (you can dilute the honey in water or another fluid).  You can also use guaifenesin and dextromethorphan for cough. You can use a humidifier for chest congestion and cough.  If you don't have a humidifier, you can sit in the bathroom with the hot shower running.      For sore throat: try warm salt water gargles, cepacol lozenges, throat spray, warm tea or water with lemon/honey, popsicles or ice, or OTC cold relief medicine for throat discomfort.   For congestion: take a daily anti-histamine like Zyrtec, Claritin, and a oral decongestant, such as pseudoephedrine.  You can also use Flonase 1-2 sprays in each nostril daily.   It is important to stay hydrated: drink plenty of fluids (water, gatorade/powerade/pedialyte, juices, or teas) to keep your throat moisturized and help further relieve irritation/discomfort.

## 2021-01-04 ENCOUNTER — Telehealth: Payer: Medicare Other | Admitting: Family Medicine

## 2021-01-04 LAB — SARS CORONAVIRUS 2 (TAT 6-24 HRS): SARS Coronavirus 2: NEGATIVE

## 2021-01-28 ENCOUNTER — Ambulatory Visit (INDEPENDENT_AMBULATORY_CARE_PROVIDER_SITE_OTHER): Payer: Medicare Other | Admitting: Family Medicine

## 2021-01-28 ENCOUNTER — Encounter: Payer: Self-pay | Admitting: Family Medicine

## 2021-01-28 ENCOUNTER — Other Ambulatory Visit: Payer: Self-pay

## 2021-01-28 VITALS — BP 120/84 | HR 72 | Ht 67.0 in | Wt 160.0 lb

## 2021-01-28 DIAGNOSIS — J3489 Other specified disorders of nose and nasal sinuses: Secondary | ICD-10-CM | POA: Diagnosis not present

## 2021-01-28 DIAGNOSIS — N952 Postmenopausal atrophic vaginitis: Secondary | ICD-10-CM

## 2021-01-28 MED ORDER — MUPIROCIN CALCIUM 2 % NA OINT: 1.0000 "application " | TOPICAL_OINTMENT | Freq: Two times a day (BID) | NASAL | 0 refills | Status: DC

## 2021-01-28 NOTE — Progress Notes (Signed)
Date:  01/28/2021   Name:  Caroline Harris   DOB:  Jul 06, 1945   MRN:  [id number]   Chief Complaint: vaginal odor and lump in nose (From Mohs surgery)  Patient is a 75year old female who presents for a nasal exam status post Mohs surgery. The patient reports the following problems: Can feel a firm area lateral aspect of right nasal passage that she had recent Mohs surgery.[facility] maintenance has been reviewed up-to-date    Female GU Problem The patient's primary symptoms include a genital odor. The patient's pertinent negatives include no genital itching, genital rash or vaginal discharge. This is a new problem. The current episode started more than 1 month ago. The problem occurs daily. The problem has been unchanged. The patient is experiencing no pain. Pertinent negatives include no abdominal pain, back pain, chills, constipation, diarrhea, discolored urine, fever, nausea, rash or sore throat. Nothing aggravates the symptoms. She has tried nothing for the symptoms. She is not sexually active.   Lab Results  Component Value Date   NA 140 08/24/2020   K 4.4 08/24/2020   CO2 26 08/24/2020   GLUCOSE 87 08/24/2020   BUN 12 08/24/2020   CREATININE 0.74 08/24/2020   CALCIUM 9.3 08/24/2020   EGFR 84 08/24/2020   GFRNONAA 85 08/16/2019   Lab Results  Component Value Date   CHOL 187 08/24/2020   HDL 86 08/24/2020   LDLCALC 86 08/24/2020   TRIG 84 08/24/2020   CHOLHDL 1.7 11/05/2018   Lab Results  Component Value Date   TSH 1.120 06/01/2017   No results found for: HGBA1C Lab Results  Component Value Date   WBC 9.9 08/04/2019   HGB 14.1 08/04/2019   HCT 39.8 08/04/2019   MCV 92 08/04/2019   PLT 257 08/04/2019   Lab Results  Component Value Date   ALT 17 08/24/2020   AST 23 08/24/2020   ALKPHOS 38 (L) 08/24/2020   BILITOT 0.4 08/24/2020   No results found for: 25OHVITD2, 25OHVITD3, VD25OH   Review of Systems  Constitutional:  Negative for chills and fever.  HENT:   Negative for drooling, ear discharge, ear pain, nosebleeds, postnasal drip, rhinorrhea and sore throat.   Respiratory:  Negative for cough, shortness of breath and wheezing.   Cardiovascular:  Negative for chest pain, palpitations and leg swelling.  Gastrointestinal:  Negative for abdominal pain, blood in stool, constipation, diarrhea and nausea.  Endocrine: Negative for polydipsia.  Genitourinary:  Negative for vaginal bleeding, vaginal discharge and vaginal pain.  Musculoskeletal:  Negative for back pain, myalgias and neck pain.  Skin:  Negative for rash.  Hematological:  Does not bruise/bleed easily.  Psychiatric/Behavioral:  Negative for suicidal ideas. The patient is not nervous/anxious.    Patient Active Problem List   Diagnosis Date Noted   Ganglion cyst of finger of right hand 06/25/2020   Pain in finger of right hand 06/25/2020   Aortic atherosclerosis ([place]) 11/05/2018   COPD, moderate ([place]) 08/23/2015   Asthma, mild intermittent 06/07/2015   Hyperlipidemia 06/07/2015    No Known Allergies  Past Surgical History:  Procedure Laterality Date   ABDOMINAL HYSTERECTOMY     COLONOSCOPY     TONSILLECTOMY      Social History   Tobacco Use   Smoking status: Every Day    Packs/day: 0.25    Years: 50.00    Pack years: 12.50    Types: Cigarettes    Start date: 02/17/1958   Smokeless tobacco: Never  Tobacco comments:    aware needs to stop   Vaping Use   Vaping Use: Never used  Substance Use Topics   Alcohol use: Yes    Alcohol/week: 1.0 standard drink    Types: 1 Glasses of wine per week   Drug use: No     Medication list has been reviewed and updated.  Current Meds  Medication Sig   aspirin 81 MG tablet Take 81 mg by mouth daily.   budesonide (ENTOCORT EC) 3 MG 24 hr capsule TAKE ONE CAPSULE BY MOUTH EVERY MORNING   calcium carbonate 1250 MG capsule Take 1,250 mg by mouth 2 (two) times daily with a meal.   lovastatin (MEVACOR) 20 MG tablet Take 1 tablet (20 mg  total) by mouth daily.   Melatonin 10 MG TABS Take 1 tablet by mouth at bedtime.   montelukast (SINGULAIR) 10 MG tablet TAKE 1 TABLET(10 MG) BY MOUTH DAILY   Multiple Vitamins-Minerals (CENTRUM SILVER ADULT 50+ PO) Take 1 capsule by mouth daily at 6 (six) AM.    PHQ 2/9 Scores 08/24/2020 06/12/2020 05/21/2020 02/23/2020  PHQ - 2 Score 0 0 0 0  PHQ- 9 Score 0 0 - 0    GAD 7 : Generalized Anxiety Score 08/24/2020 06/12/2020 09/16/2019 07/04/2019  Nervous, Anxious, on Edge 0 0 0 0  Control/stop worrying 0 0 0 0  Worry too much - different things 0 0 0 0  Trouble relaxing 0 0 0 0  Restless 0 0 0 0  Easily annoyed or irritable 0 0 0 0  Afraid - awful might happen 0 0 0 0  Total GAD 7 Score 0 0 0 0    BP Readings from Last 3 Encounters:  01/28/21 120/84  01/03/21 132/75  10/29/20 100/70    Physical Exam Vitals and nursing note reviewed.  Constitutional:      General: She is not in acute distress.    Appearance: She is not diaphoretic.  HENT:     Head: Normocephalic and atraumatic.     Right Ear: Tympanic membrane and external ear normal.     Left Ear: Tympanic membrane and external ear normal.     Nose: Nose normal. No signs of injury, laceration, nasal tenderness, mucosal edema, congestion or rhinorrhea.     Right Nostril: No foreign body, epistaxis, septal hematoma or occlusion.     Comments: Erythema lateral wall cyst seen with an excoriated area. Eyes:     General:        Right eye: No discharge.        Left eye: No discharge.     Conjunctiva/sclera: Conjunctivae normal.     Pupils: Pupils are equal, round, and reactive to light.  Neck:     Thyroid: No thyromegaly.     Vascular: No JVD.  Cardiovascular:     Rate and Rhythm: Normal rate and regular rhythm.     Heart sounds: Normal heart sounds. No murmur heard.   No friction rub. No gallop.  Pulmonary:     Effort: Pulmonary effort is normal.     Breath sounds: Normal breath sounds.  Abdominal:     General: Bowel sounds are  normal.     Palpations: Abdomen is soft. There is no mass.     Tenderness: There is no abdominal tenderness. There is no guarding.  Genitourinary:    Exam position: Lithotomy position.     Vagina: No signs of injury and foreign body. No vaginal discharge, erythema, tenderness, bleeding, lesions  or prolapsed vaginal walls.  Musculoskeletal:        General: Normal range of motion.     Cervical back: Normal range of motion and neck supple.  Lymphadenopathy:     Cervical: No cervical adenopathy.  Skin:    General: Skin is warm and dry.  Neurological:     Mental Status: She is alert.     Deep Tendon Reflexes: Reflexes are normal and symmetric.    Wt Readings from Last 3 Encounters:  01/28/21 160 lb (72.6 kg)  10/29/20 154 lb (69.9 kg)  08/24/20 157 lb (71.2 kg)    BP 120/84   Pulse 72   Ht '5\' 7"'  (1.702 m)   Wt 160 lb (72.6 kg)   BMI 25.06 kg/m   Assessment and Plan:  1. Nasal sore New onset.  Persistent.  Patient has noticed a sore in the left lateral nostril.  I have encouraged her not to handle this area and we will treat it with nasal Bactroban apply twice a day if it is unresolved then we will refer for further investigation. - mupirocin nasal ointment (BACTROBAN) 2 %; Place 1 application into the nose 2 (two) times daily. Place small amount on a q-tip and apply to inside of R) nostril bid  Dispense: 10 g; Refill: 0  2. Atrophic vaginitis New onset.  Persistent.  Onset for about a month.  Has noticed an odor with no vaginal discharge.  Wet prep was done and there was no white cells, clue cells, or suggestions of yeast infection.  Examination of vaginal wall notes no excoriation or lesion.  But we will refer to gynecology for further evaluation since this is been going on for 4 weeks and the recommend further therapy. - Ambulatory referral to Gynecology

## 2021-01-31 ENCOUNTER — Telehealth: Payer: Self-pay

## 2021-01-31 NOTE — Telephone Encounter (Signed)
[facility] referring for vaginal odor. Called and left voicemail for patient to call back to be scheduled.

## 2021-01-31 NOTE — Telephone Encounter (Signed)
Patient is scheduled for 02/22/20 with JEG

## 2021-02-21 ENCOUNTER — Ambulatory Visit (INDEPENDENT_AMBULATORY_CARE_PROVIDER_SITE_OTHER): Payer: PPO | Admitting: Advanced Practice Midwife

## 2021-02-21 ENCOUNTER — Other Ambulatory Visit (HOSPITAL_COMMUNITY)
Admission: RE | Admit: 2021-02-21 | Discharge: 2021-02-21 | Disposition: A | Discharge status: Home / Self Care | Payer: PPO | Source: Ambulatory Visit | Attending: Advanced Practice Midwife | Admitting: Advanced Practice Midwife

## 2021-02-21 ENCOUNTER — Encounter: Payer: Self-pay | Admitting: Advanced Practice Midwife

## 2021-02-21 ENCOUNTER — Other Ambulatory Visit: Payer: Self-pay

## 2021-02-21 VITALS — BP 154/91 | Ht 67.0 in | Wt 159.0 lb

## 2021-02-21 DIAGNOSIS — N898 Other specified noninflammatory disorders of vagina: Secondary | ICD-10-CM

## 2021-02-21 LAB — POCT URINALYSIS DIPSTICK
Bilirubin, UA: NEGATIVE
Blood, UA: NEGATIVE
Glucose, UA: NEGATIVE
Ketones, UA: NEGATIVE
Leukocytes, UA: NEGATIVE
Nitrite, UA: NEGATIVE
Protein, UA: NEGATIVE
Spec Grav, UA: 1.01 (ref 1.010–1.025)
Urobilinogen, UA: 0.2 E.U./dL
pH, UA: 6 (ref 5.0–8.0)

## 2021-02-21 NOTE — Progress Notes (Signed)
Patient ID: Caroline Harris, female   DOB: Jan 31, 1946, 76 y.o.   MRN: [id number]  Reason for Consult: Vaginal Discharge (Referral for vaginal odor. Just a small yellowish discharge, mostly a odor x1 month. RM 3)   Referred by Juline Patch, MD  Subjective:  HPI:  Caroline Harris is a 76 y.o. female vaginal odor for the past month. She denies fishy or urine odor. She denies discharge, itching, irritation, burning with urination, frequency or urgency. She denies urine leakage. She is not sexually active. She had a total hysterectomy at age 66 and she discontinued PAP smears at that time. She has not changed body care or laundry products. We discussed preventative and comfort measures for vaginitis.  Past Medical History:  Diagnosis Date   Collagenous colitis    Diverticulitis    Hyperlipidemia    Osteopenia    Restless leg    Family History  Problem Relation Age of Onset   Breast cancer Mother 59   Heart attack Father    Past Surgical History:  Procedure Laterality Date   ABDOMINAL HYSTERECTOMY     COLONOSCOPY     TONSILLECTOMY      Short Social History:  Social History   Tobacco Use   Smoking status: Every Day    Packs/day: 0.25    Years: 50.00    Pack years: 12.50    Types: Cigarettes    Start date: 02/17/1958   Smokeless tobacco: Never   Tobacco comments:    aware needs to stop   Substance Use Topics   Alcohol use: Yes    Alcohol/week: 1.0 standard drink    Types: 1 Glasses of wine per week    No Known Allergies  Current Outpatient Medications  Medication Sig Dispense Refill   aspirin 81 MG tablet Take 81 mg by mouth daily.     budesonide (ENTOCORT EC) 3 MG 24 hr capsule TAKE ONE CAPSULE BY MOUTH EVERY MORNING 30 capsule 5   calcium carbonate 1250 MG capsule Take 1,250 mg by mouth 2 (two) times daily with a meal.     lovastatin (MEVACOR) 20 MG tablet Take 1 tablet (20 mg total) by mouth daily. 90 tablet 1   Magnesium 400 MG TABS      Melatonin 10 MG TABS Take  1 tablet by mouth at bedtime.     montelukast (SINGULAIR) 10 MG tablet TAKE 1 TABLET(10 MG) BY MOUTH DAILY 90 tablet 1   Multiple Vitamins-Minerals (CENTRUM SILVER ADULT 50+ PO) Take 1 capsule by mouth daily at 6 (six) AM.     No current facility-administered medications for this visit.    Review of Systems  Constitutional:  Negative for chills and fever.  HENT:  Negative for congestion, ear discharge, ear pain, hearing loss, sinus pain and sore throat.   Eyes:  Negative for blurred vision and double vision.  Respiratory:  Negative for cough, shortness of breath and wheezing.   Cardiovascular:  Negative for chest pain, palpitations and leg swelling.  Gastrointestinal:  Negative for abdominal pain, blood in stool, constipation, diarrhea, heartburn, melena, nausea and vomiting.  Genitourinary:  Negative for dysuria, flank pain, frequency, hematuria and urgency.       Positive for vaginal odor  Musculoskeletal:  Negative for back pain, joint pain and myalgias.  Skin:  Negative for itching and rash.  Neurological:  Negative for dizziness, tingling, tremors, sensory change, speech change, focal weakness, seizures, loss of consciousness, weakness and headaches.  Endo/Heme/Allergies:  Negative for  environmental allergies. Does not bruise/bleed easily.  Psychiatric/Behavioral:  Negative for depression, hallucinations, memory loss, substance abuse and suicidal ideas. The patient is not nervous/anxious and does not have insomnia.         Objective:  Objective   Vitals:   02/21/21 1417  BP: (!) 154/91  Weight: 159 lb (72.1 kg)  Height: 5\' 7"  (1.702 m)   Body mass index is 24.9 kg/m. Constitutional: Well nourished, well developed female in no acute distress.  HEENT: normal Skin: Warm and dry.  Cardiovascular: Regular rate and rhythm.   Extremity:  no edema   Respiratory: Clear to auscultation bilateral. Normal respiratory effort Neuro: DTRs 2+, Cranial nerves grossly intact Psych:  Alert and Oriented x3. No memory deficits. Normal mood and affect.  MS: normal gait, normal bilateral lower extremity ROM/strength/stability.  Pelvic exam: is not limited by body habitus EGBUS: within normal limits Vagina: within normal limits and with normal post menopausal atrophic mucosa, no discharge   Data:  Latest Reference Range & Units 02/21/21 14:43  Appearance  Clear  Bilirubin, UA  Negative  Color, UA  Pale yellow  Glucose Negative  Negative  Ketones, UA  Negative  Leukocytes,UA Negative  Negative  Nitrite, UA  Negative  pH, UA 5.0 - 8.0  6.0  Protein,UA Negative  Negative  Specific Gravity, UA 1.010 - 1.025  1.010  Urobilinogen, UA 0.2 or 1.0 E.U./dL 0.2  RBC, UA  Negative       Assessment/Plan:     76 y.o. G62 [id number] female with vaginal odor/possible BV  Urinalysis Aptima: vaginitis Follow up as needed after lab results/metro gel preferred if needed Apple cider vinegar tub soak weekly   [facility] 02/21/2021, 3:03 PM

## 2021-02-23 LAB — CERVICOVAGINAL ANCILLARY ONLY
Bacterial Vaginitis (gardnerella): NEGATIVE
Candida Glabrata: NEGATIVE
Candida Vaginitis: NEGATIVE
Comment: NEGATIVE
Comment: NEGATIVE
Comment: NEGATIVE

## 2021-02-26 ENCOUNTER — Encounter: Payer: Self-pay | Admitting: Family Medicine

## 2021-02-26 ENCOUNTER — Other Ambulatory Visit: Payer: Self-pay

## 2021-02-26 ENCOUNTER — Ambulatory Visit (INDEPENDENT_AMBULATORY_CARE_PROVIDER_SITE_OTHER): Payer: PPO | Admitting: Family Medicine

## 2021-02-26 VITALS — BP 128/84 | HR 58 | Ht 67.0 in | Wt 157.0 lb

## 2021-02-26 DIAGNOSIS — E785 Hyperlipidemia, unspecified: Secondary | ICD-10-CM | POA: Diagnosis not present

## 2021-02-26 DIAGNOSIS — J4520 Mild intermittent asthma, uncomplicated: Secondary | ICD-10-CM

## 2021-02-26 MED ORDER — LOVASTATIN 20 MG PO TABS: 20.0000 mg | ORAL_TABLET | Freq: Every day | ORAL | 1 refills | Status: DC

## 2021-02-26 MED ORDER — MONTELUKAST SODIUM 10 MG PO TABS: ORAL_TABLET | ORAL | 1 refills | Status: DC

## 2021-02-26 NOTE — Progress Notes (Signed)
Date:  02/26/2021   Name:  Caroline Harris   DOB:  04/07/1945   MRN:  [id number]   Chief Complaint: Allergic Rhinitis  and Hyperlipidemia  Hyperlipidemia This is a chronic problem. The current episode started more than 1 year ago. The problem is controlled. Recent lipid tests were reviewed and are normal. She has no history of diabetes or obesity. Pertinent negatives include no chest pain, focal sensory loss, focal weakness, leg pain, myalgias or shortness of breath. Current antihyperlipidemic treatment includes diet change, exercise and statins. The current treatment provides moderate improvement of lipids. There are no compliance problems.  Risk factors for coronary artery disease include dyslipidemia and hypertension.   Lab Results  Component Value Date   NA 140 08/24/2020   K 4.4 08/24/2020   CO2 26 08/24/2020   GLUCOSE 87 08/24/2020   BUN 12 08/24/2020   CREATININE 0.74 08/24/2020   CALCIUM 9.3 08/24/2020   EGFR 84 08/24/2020   GFRNONAA 85 08/16/2019   Lab Results  Component Value Date   CHOL 187 08/24/2020   HDL 86 08/24/2020   LDLCALC 86 08/24/2020   TRIG 84 08/24/2020   CHOLHDL 1.7 11/05/2018   Lab Results  Component Value Date   TSH 1.120 06/01/2017   No results found for: HGBA1C Lab Results  Component Value Date   WBC 9.9 08/04/2019   HGB 14.1 08/04/2019   HCT 39.8 08/04/2019   MCV 92 08/04/2019   PLT 257 08/04/2019   Lab Results  Component Value Date   ALT 17 08/24/2020   AST 23 08/24/2020   ALKPHOS 38 (L) 08/24/2020   BILITOT 0.4 08/24/2020   No results found for: 25OHVITD2, 25OHVITD3, VD25OH   Review of Systems  Constitutional:  Negative for chills and fever.  HENT:  Negative for drooling, ear discharge, ear pain and sore throat.   Respiratory:  Negative for cough, shortness of breath and wheezing.   Cardiovascular:  Negative for chest pain, palpitations and leg swelling.  Gastrointestinal:  Negative for abdominal pain, blood in stool,  constipation, diarrhea and nausea.  Endocrine: Negative for polydipsia.  Genitourinary:  Negative for dysuria, frequency, hematuria and urgency.  Musculoskeletal:  Negative for back pain, myalgias and neck pain.  Skin:  Negative for rash.  Allergic/Immunologic: Negative for environmental allergies.  Neurological:  Negative for dizziness, focal weakness and headaches.  Hematological:  Does not bruise/bleed easily.  Psychiatric/Behavioral:  Negative for suicidal ideas. The patient is not nervous/anxious.    Patient Active Problem List   Diagnosis Date Noted   Ganglion cyst of finger of right hand 06/25/2020   Pain in finger of right hand 06/25/2020   Aortic atherosclerosis ([place]) 11/05/2018   COPD, moderate ([place]) 08/23/2015   Asthma, mild intermittent 06/07/2015   Hyperlipidemia 06/07/2015    No Known Allergies  Past Surgical History:  Procedure Laterality Date   ABDOMINAL HYSTERECTOMY     COLONOSCOPY     TONSILLECTOMY      Social History   Tobacco Use   Smoking status: Every Day    Packs/day: 0.25    Years: 50.00    Pack years: 12.50    Types: Cigarettes    Start date: 02/17/1958   Smokeless tobacco: Never   Tobacco comments:    aware needs to stop   Vaping Use   Vaping Use: Never used  Substance Use Topics   Alcohol use: Yes    Alcohol/week: 1.0 standard drink    Types: 1 Glasses of wine  per week   Drug use: No     Medication list has been reviewed and updated.  Current Meds  Medication Sig   aspirin 81 MG tablet Take 81 mg by mouth daily.   budesonide (ENTOCORT EC) 3 MG 24 hr capsule TAKE ONE CAPSULE BY MOUTH EVERY MORNING   calcium carbonate 1250 MG capsule Take 1,250 mg by mouth 2 (two) times daily with a meal.   lovastatin (MEVACOR) 20 MG tablet Take 1 tablet (20 mg total) by mouth daily.   Magnesium 400 MG TABS    Melatonin 10 MG TABS Take 1 tablet by mouth at bedtime.   montelukast (SINGULAIR) 10 MG tablet TAKE 1 TABLET(10 MG) BY MOUTH DAILY   Multiple  Vitamins-Minerals (CENTRUM SILVER ADULT 50+ PO) Take 1 capsule by mouth daily at 6 (six) AM.    PHQ 2/9 Scores 02/26/2021 08/24/2020 06/12/2020 05/21/2020  PHQ - 2 Score 0 0 0 0  PHQ- 9 Score 0 0 0 -    GAD 7 : Generalized Anxiety Score 02/26/2021 08/24/2020 06/12/2020 09/16/2019  Nervous, Anxious, on Edge 0 0 0 0  Control/stop worrying 0 0 0 0  Worry too much - different things 0 0 0 0  Trouble relaxing 0 0 0 0  Restless 0 0 0 0  Easily annoyed or irritable 0 0 0 0  Afraid - awful might happen 0 0 0 0  Total GAD 7 Score 0 0 0 0  Anxiety Difficulty Not difficult at all - - -    BP Readings from Last 3 Encounters:  02/26/21 128/84  02/21/21 (!) 154/91  01/28/21 120/84    Physical Exam Vitals and nursing note reviewed.  Constitutional:      Appearance: She is well-developed.  HENT:     Head: Normocephalic.     Right Ear: Tympanic membrane, ear canal and external ear normal. There is no impacted cerumen.     Left Ear: Tympanic membrane, ear canal and external ear normal. There is no impacted cerumen.     Nose: Nose normal. No congestion or rhinorrhea.     Mouth/Throat:     Mouth: Mucous membranes are moist.     Pharynx: Oropharynx is clear.  Eyes:     General: Lids are everted, no foreign bodies appreciated. No scleral icterus.       Right eye: No discharge.        Left eye: No foreign body, discharge or hordeolum.     Conjunctiva/sclera: Conjunctivae normal.     Right eye: Right conjunctiva is not injected.     Left eye: Left conjunctiva is not injected.     Pupils: Pupils are equal, round, and reactive to light.  Neck:     Thyroid: No thyromegaly.     Vascular: No JVD.     Trachea: No tracheal deviation.  Cardiovascular:     Rate and Rhythm: Normal rate and regular rhythm.     Heart sounds: Normal heart sounds. No murmur heard.   No friction rub. No gallop.  Pulmonary:     Effort: Pulmonary effort is normal. No respiratory distress.     Breath sounds: Normal breath sounds.  No wheezing, rhonchi or rales.  Abdominal:     General: Bowel sounds are normal. There is no distension.     Palpations: Abdomen is soft. There is no mass.     Tenderness: There is no abdominal tenderness. There is no guarding or rebound.     Hernia: No hernia is  present.  Musculoskeletal:        General: No tenderness. Normal range of motion.     Cervical back: Normal range of motion and neck supple.  Lymphadenopathy:     Cervical: No cervical adenopathy.  Skin:    General: Skin is warm.     Findings: No rash.  Neurological:     Mental Status: She is alert and oriented to person, place, and time.     Cranial Nerves: No cranial nerve deficit.     Deep Tendon Reflexes: Reflexes normal.  Psychiatric:        Mood and Affect: Mood is not anxious or depressed.    Wt Readings from Last 3 Encounters:  02/26/21 157 lb (71.2 kg)  02/21/21 159 lb (72.1 kg)  01/28/21 160 lb (72.6 kg)    BP 128/84    Pulse (!) 58    Ht _0  (1.702 m)    Wt 157 lb (71.2 kg)    BMI 24.59 kg/m   Assessment and Plan:  1. Hyperlipidemia, unspecified hyperlipidemia type Chronic.  Controlled.  Stable.  Continue lovastatin 20 mg once a day.  Will check lipid panel for current level of LDL control - lovastatin (MEVACOR) 20 MG tablet; Take 1 tablet (20 mg total) by mouth daily.  Dispense: 90 tablet; Refill: 1 - Lipid Panel With LDL/HDL Ratio  2. Mild intermittent asthma, unspecified whether complicated Chronic.  Controlled.  Stable.  Mild.  Episodes are intermittent.  Continue Singulair 10 mg once a day. - montelukast (SINGULAIR) 10 MG tablet; TAKE 1 TABLET(10 MG) BY MOUTH DAILY  Dispense: 90 tablet; Refill: 1

## 2021-02-27 LAB — LIPID PANEL WITH LDL/HDL RATIO
Cholesterol, Total: 178 mg/dL (ref 100–199)
HDL: 83 mg/dL (ref >39)
LDL Chol Calc (NIH): 80 mg/dL (ref 0–99)
LDL/HDL Ratio: 1 ratio (ref 0.0–3.2)
Triglycerides: 81 mg/dL (ref 0–149)
VLDL Cholesterol Cal: 15 mg/dL (ref 5–40)

## 2021-03-13 DIAGNOSIS — R52 Pain, unspecified: Secondary | ICD-10-CM | POA: Insufficient documentation

## 2021-03-13 DIAGNOSIS — M65849 Other synovitis and tenosynovitis, unspecified hand: Secondary | ICD-10-CM | POA: Insufficient documentation

## 2021-03-13 DIAGNOSIS — M13849 Other specified arthritis, unspecified hand: Secondary | ICD-10-CM | POA: Diagnosis not present

## 2021-03-19 DIAGNOSIS — Z1231 Encounter for screening mammogram for malignant neoplasm of breast: Secondary | ICD-10-CM | POA: Diagnosis not present

## 2021-03-22 LAB — HM MAMMOGRAPHY

## 2021-03-29 ENCOUNTER — Encounter: Payer: Self-pay | Admitting: Family Medicine

## 2021-03-29 ENCOUNTER — Emergency Department: Payer: PPO

## 2021-03-29 ENCOUNTER — Encounter (INDEPENDENT_AMBULATORY_CARE_PROVIDER_SITE_OTHER): Payer: BC Managed Care – PPO | Admitting: Family Medicine

## 2021-03-29 ENCOUNTER — Telehealth: Payer: Self-pay | Admitting: Gastroenterology

## 2021-03-29 ENCOUNTER — Inpatient Hospital Stay
Admission: EM | Admit: 2021-03-29 | Discharge: 2021-03-31 | DRG: 392 | Disposition: A | Discharge status: Home / Self Care | Payer: PPO | Attending: Internal Medicine | Admitting: Internal Medicine

## 2021-03-29 ENCOUNTER — Other Ambulatory Visit: Payer: Self-pay

## 2021-03-29 DIAGNOSIS — K5792 Diverticulitis of intestine, part unspecified, without perforation or abscess without bleeding: Secondary | ICD-10-CM

## 2021-03-29 DIAGNOSIS — J4520 Mild intermittent asthma, uncomplicated: Secondary | ICD-10-CM | POA: Diagnosis present

## 2021-03-29 DIAGNOSIS — Z20822 Contact with and (suspected) exposure to covid-19: Secondary | ICD-10-CM | POA: Diagnosis present

## 2021-03-29 DIAGNOSIS — R109 Unspecified abdominal pain: Secondary | ICD-10-CM | POA: Diagnosis not present

## 2021-03-29 DIAGNOSIS — Z7982 Long term (current) use of aspirin: Secondary | ICD-10-CM | POA: Diagnosis not present

## 2021-03-29 DIAGNOSIS — Z8249 Family history of ischemic heart disease and other diseases of the circulatory system: Secondary | ICD-10-CM | POA: Diagnosis not present

## 2021-03-29 DIAGNOSIS — K5732 Diverticulitis of large intestine without perforation or abscess without bleeding: Principal | ICD-10-CM | POA: Diagnosis present

## 2021-03-29 DIAGNOSIS — E785 Hyperlipidemia, unspecified: Secondary | ICD-10-CM | POA: Diagnosis not present

## 2021-03-29 DIAGNOSIS — G2581 Restless legs syndrome: Secondary | ICD-10-CM | POA: Diagnosis present

## 2021-03-29 DIAGNOSIS — F17210 Nicotine dependence, cigarettes, uncomplicated: Secondary | ICD-10-CM | POA: Diagnosis not present

## 2021-03-29 DIAGNOSIS — R1032 Left lower quadrant pain: Secondary | ICD-10-CM | POA: Diagnosis present

## 2021-03-29 DIAGNOSIS — M8580 Other specified disorders of bone density and structure, unspecified site: Secondary | ICD-10-CM | POA: Diagnosis not present

## 2021-03-29 DIAGNOSIS — Z79899 Other long term (current) drug therapy: Secondary | ICD-10-CM

## 2021-03-29 DIAGNOSIS — Z803 Family history of malignant neoplasm of breast: Secondary | ICD-10-CM | POA: Diagnosis not present

## 2021-03-29 DIAGNOSIS — K52831 Collagenous colitis: Secondary | ICD-10-CM | POA: Diagnosis not present

## 2021-03-29 DIAGNOSIS — I700 Atherosclerosis of aorta: Secondary | ICD-10-CM | POA: Diagnosis not present

## 2021-03-29 HISTORY — DX: Diverticulitis of intestine, part unspecified, without perforation or abscess without bleeding: K57.92

## 2021-03-29 HISTORY — DX: Noninfective gastroenteritis and colitis, unspecified: K52.9

## 2021-03-29 LAB — BASIC METABOLIC PANEL
Anion gap: 14 (ref 5–15)
BUN: 13 mg/dL (ref 8–23)
CO2: 22 mmol/L (ref 22–32)
Calcium: 9 mg/dL (ref 8.9–10.3)
Chloride: 98 mmol/L (ref 98–111)
Creatinine, Ser: 0.52 mg/dL (ref 0.44–1.00)
GFR, Estimated: >60 mL/min (ref >60)
Glucose, Bld: 114 mg/dL — ABNORMAL HIGH (ref 70–99)
Potassium: 3.9 mmol/L (ref 3.5–5.1)
Sodium: 134 mmol/L — ABNORMAL LOW (ref 135–145)

## 2021-03-29 LAB — CBC
HCT: 39 % (ref 36.0–46.0)
Hemoglobin: 12.9 g/dL (ref 12.0–15.0)
MCH: 31.8 pg (ref 26.0–34.0)
MCHC: 33.1 g/dL (ref 30.0–36.0)
MCV: 96.1 fL (ref 80.0–100.0)
Platelets: 260 10*3/uL (ref 150–400)
RBC: 4.06 MIL/uL (ref 3.87–5.11)
RDW: 14.1 % (ref 11.5–15.5)
WBC: 13 10*3/uL — ABNORMAL HIGH (ref 4.0–10.5)
nRBC: 0 % (ref 0.0–0.2)

## 2021-03-29 LAB — URINALYSIS, ROUTINE W REFLEX MICROSCOPIC
Bilirubin Urine: NEGATIVE
Glucose, UA: NEGATIVE mg/dL
Ketones, ur: NEGATIVE mg/dL
Leukocytes,Ua: NEGATIVE
Nitrite: NEGATIVE
Protein, ur: NEGATIVE mg/dL
Specific Gravity, Urine: 1.005 (ref 1.005–1.030)
pH: 5 (ref 5.0–8.0)

## 2021-03-29 LAB — RESP PANEL BY RT-PCR (FLU A&B, COVID) ARPGX2
Influenza A by PCR: NEGATIVE
Influenza B by PCR: NEGATIVE
SARS Coronavirus 2 by RT PCR: NEGATIVE

## 2021-03-29 MED ORDER — ACETAMINOPHEN 325 MG PO TABS
650.0000 mg | ORAL_TABLET | Freq: Four times a day (QID) | ORAL | Status: DC | PRN
  Administered 2021-03-30 – 2021-03-31 (×3): 650 mg via ORAL
  Filled 2021-03-29 (×3): qty 2

## 2021-03-29 MED ORDER — ACETAMINOPHEN 325 MG RE SUPP
650.0000 mg | Freq: Four times a day (QID) | RECTAL | Status: DC | PRN
  Filled 2021-03-29: qty 2

## 2021-03-29 MED ORDER — MELATONIN 5 MG PO TABS
10.0000 mg | ORAL_TABLET | Freq: Every day | ORAL | Status: DC
  Administered 2021-03-29 – 2021-03-30 (×2): 10 mg via ORAL
  Filled 2021-03-29 (×2): qty 2

## 2021-03-29 MED ORDER — PIPERACILLIN-TAZOBACTAM 3.375 G IVPB
3.3750 g | Freq: Three times a day (TID) | INTRAVENOUS | Status: DC
  Administered 2021-03-29 – 2021-03-31 (×5): 3.375 g via INTRAVENOUS
  Filled 2021-03-29 (×5): qty 50

## 2021-03-29 MED ORDER — LACTATED RINGERS IV SOLN: INTRAVENOUS | Status: DC

## 2021-03-29 MED ORDER — PIPERACILLIN-TAZOBACTAM 3.375 G IVPB 30 MIN: 3.3750 g | Freq: Three times a day (TID) | INTRAVENOUS | Status: DC

## 2021-03-29 MED ORDER — IOHEXOL 300 MG/ML  SOLN
100.0000 mL | Freq: Once | INTRAMUSCULAR | Status: AC | PRN
  Administered 2021-03-29: 100 mL via INTRAVENOUS
  Filled 2021-03-29: qty 100

## 2021-03-29 MED ORDER — LACTATED RINGERS IV SOLN: INTRAVENOUS | Status: AC

## 2021-03-29 MED ORDER — BUDESONIDE 3 MG PO CPEP
3.0000 mg | ORAL_CAPSULE | Freq: Every day | ORAL | Status: DC
  Administered 2021-03-30: 3 mg via ORAL
  Filled 2021-03-29 (×2): qty 1

## 2021-03-29 MED ORDER — HEPARIN SODIUM (PORCINE) 5000 UNIT/ML IJ SOLN
5000.0000 [IU] | Freq: Three times a day (TID) | INTRAMUSCULAR | Status: DC
  Administered 2021-03-29 – 2021-03-30 (×4): 5000 [IU] via SUBCUTANEOUS
  Filled 2021-03-29 (×5): qty 1

## 2021-03-29 NOTE — Plan of Care (Signed)
  Problem: Clinical Measurements: Goal: Ability to maintain clinical measurements within normal limits will improve Outcome: Progressing Goal: Diagnostic test results will improve Outcome: Progressing   

## 2021-03-29 NOTE — Telephone Encounter (Signed)
Patient verbalized understanding she states she is in the er now

## 2021-03-29 NOTE — Telephone Encounter (Signed)
Please inform patient that it is hard to tell where exactly the pain is coming from with no associated GI symptoms.  If the pain is worsening, please have her go to urgent care.  RV

## 2021-03-29 NOTE — Consult Note (Signed)
Pharmacy Antibiotic Note  Caroline Harris is a 76 y.o. female admitted on 03/29/2021 with an intra-abdominal infection.  Pharmacy has been consulted for  dosing.  Plan: Zosyn 3.375 gm IV q8h (4 hour infusion) Monitor clinical picture and renal function F/U C&S, abx deescalation / LOT   Height: 5\' 7"  (170.2 cm) Weight: 71.2 kg (157 lb) IBW/kg (Calculated) : 61.6  Temp (24hrs), Avg:98.1 F (36.7 C), Min:97.8 F (36.6 C), Max:98.3 F (36.8 C)  Recent Labs  Lab 03/29/21 1317  WBC 13.0*  CREATININE 0.52    Estimated Creatinine Clearance: 58.2 mL/min (by C-G formula based on SCr of 0.52 mg/dL).    No Known Allergies  Antimicrobials this admission: 2/10 Zosyn >>    Dose adjustments this admission:   Microbiology results: No cultures ordered at this time  Thank you for allowing pharmacy to be a part of this patients care.  Darnelle Bos, PharmD 03/29/2021 8:02 PM

## 2021-03-29 NOTE — Telephone Encounter (Signed)
Pt is calling complaining of left side pain x2 days

## 2021-03-29 NOTE — ED Triage Notes (Signed)
Pt to ED via POV from home. Pt reports pain in left flank that started yesterday. Pt states this morning around 3am was awoken by excruciating pain. Pt denies any urinary symptoms. Pt denies CP, SOB, N/V/D.

## 2021-03-29 NOTE — ED Notes (Signed)
See triage note  presents with flank pain  states pain started yesterday  became worse this am

## 2021-03-29 NOTE — Telephone Encounter (Signed)
The pain sharp pain that is constant that is around the left side of waist. Denies nausea constipation diarrhea or constipation

## 2021-03-29 NOTE — H&P (Signed)
History and Physical    Caroline Harris NLG:[id number] DOB: 05/07/1945 DOA: 03/29/2021  PCP: Juline Patch, MD  Patient coming from: Home.  Chief Complaint: Abdominal pain.  HPI: Caroline Harris is a 76 y.o. female with history of collagenous colitis, hyperlipidemia presents to the ER with left lower quadrant pain.  Patient's pain started 24 hours ago with severe left lower quadrant pain no associated nausea vomiting fever or chills.  She did have a normal bowel movement this morning.  Since the pain continued patient went to her primary care physician and was advised to come to the ER.  ED Course: In the ER on exam patient does have left lower quadrant tenderness Labs show mild leukocytosis and CT scan shows uncomplicated diverticulitis.  Given that patient has leukocytosis and ongoing pain admitted for further observation.  COVID test is pending.  Review of Systems: As per HPI, rest all negative.   Past Medical History:  Diagnosis Date   Collagenous colitis    Diverticulitis    Hyperlipidemia    Osteopenia    Restless leg     Past Surgical History:  Procedure Laterality Date   ABDOMINAL HYSTERECTOMY     COLONOSCOPY     TONSILLECTOMY       reports that she has been smoking cigarettes. She started smoking about 63 years ago. She has a 12.50 pack-year smoking history. She has never used smokeless tobacco. She reports current alcohol use of about 1.0 standard drink per week. She reports that she does not use drugs.  No Known Allergies  Family History  Problem Relation Age of Onset   Breast cancer Mother 60   Heart attack Father     Prior to Admission medications   Medication Sig Start Date End Date Taking? Authorizing Provider  aspirin 81 MG tablet Take 81 mg by mouth daily.    [provider]  budesonide (ENTOCORT EC) 3 MG 24 hr capsule TAKE ONE CAPSULE BY MOUTH EVERY MORNING 11/30/20   Vanga, Tally Due, MD  calcium carbonate 1250 MG capsule Take 1,250 mg by  mouth 2 (two) times daily with a meal.    [provider]  lovastatin (MEVACOR) 20 MG tablet Take 1 tablet (20 mg total) by mouth daily. 02/26/21   Juline Patch, MD  Magnesium 400 MG TABS  01/01/21   [provider]  Melatonin 10 MG TABS Take 1 tablet by mouth at bedtime.    [provider]  montelukast (SINGULAIR) 10 MG tablet TAKE 1 TABLET(10 MG) BY MOUTH DAILY 02/26/21   Juline Patch, MD  Multiple Vitamins-Minerals (CENTRUM SILVER ADULT 50+ PO) Take 1 capsule by mouth daily at 6 (six) AM.    [provider]    Physical Exam: Constitutional: Moderately built and nourished. Vitals:   03/29/21 1430 03/29/21 1500 03/29/21 1530 03/29/21 1600  BP: 131/64 126/65 127/64 129/69  Pulse: 63 65 63 62  Resp: 18 16 18 18   Temp:      TempSrc:      SpO2: 98% 97% 99% 97%  Weight:      Height:       Eyes: Anicteric no pallor. ENMT: No discharge from the ears eyes nose and mouth. Neck: No mass felt.  No neck rigidity. Respiratory: No rhonchi or crepitations. Cardiovascular: S1-S2 heard. Abdomen: Left lower quadrant tenderness present no guarding or rigidity. Musculoskeletal: No edema. Skin: No rash. Neurologic: Alert awake oriented to time place and person.  Moves all extremities.  Psychiatric: Appears normal.  Normal affect.   Labs on Admission: I have personally reviewed following labs and imaging studies  CBC: Recent Labs  Lab 03/29/21 1317  WBC 13.0*  HGB 12.9  HCT 39.0  MCV 96.1  PLT 373   Basic Metabolic Panel: Recent Labs  Lab 03/29/21 1317  NA 134*  K 3.9  CL 98  CO2 22  GLUCOSE 114*  BUN 13  CREATININE 0.52  CALCIUM 9.0   GFR: Estimated Creatinine Clearance: 58.2 mL/min (by C-G formula based on SCr of 0.52 mg/dL). Liver Function Tests: No results for input(s): AST, ALT, ALKPHOS, BILITOT, PROT, ALBUMIN in the last 168 hours. No results for input(s): LIPASE, AMYLASE in the last 168 hours. No results for input(s): AMMONIA in  the last 168 hours. Coagulation Profile: No results for input(s): INR, PROTIME in the last 168 hours. Cardiac Enzymes: No results for input(s): CKTOTAL, CKMB, CKMBINDEX, TROPONINI in the last 168 hours. BNP (last 3 results) No results for input(s): PROBNP in the last 8760 hours. HbA1C: No results for input(s): HGBA1C in the last 72 hours. CBG: No results for input(s): GLUCAP in the last 168 hours. Lipid Profile: No results for input(s): CHOL, HDL, LDLCALC, TRIG, CHOLHDL, LDLDIRECT in the last 72 hours. Thyroid Function Tests: No results for input(s): TSH, T4TOTAL, FREET4, T3FREE, THYROIDAB in the last 72 hours. Anemia Panel: No results for input(s): VITAMINB12, FOLATE, FERRITIN, TIBC, IRON, RETICCTPCT in the last 72 hours. Urine analysis:    Component Value Date/Time   COLORURINE YELLOW (A) 03/29/2021 1317   APPEARANCEUR CLEAR (A) 03/29/2021 1317   LABSPEC 1.005 03/29/2021 1317   PHURINE 5.0 03/29/2021 1317   GLUCOSEU NEGATIVE 03/29/2021 1317   HGBUR SMALL (A) 03/29/2021 1317   BILIRUBINUR NEGATIVE 03/29/2021 1317   BILIRUBINUR Negative 02/21/2021 1443   KETONESUR NEGATIVE 03/29/2021 1317   PROTEINUR NEGATIVE 03/29/2021 1317   UROBILINOGEN 0.2 02/21/2021 1443   NITRITE NEGATIVE 03/29/2021 1317   LEUKOCYTESUR NEGATIVE 03/29/2021 1317   Sepsis Labs: @LABRCNTIP (procalcitonin:4,lacticidven:4) )No results found for this or any previous visit (from the past 240 hour(s)).   Radiological Exams on Admission: CT ABDOMEN PELVIS W CONTRAST  Result Date: 03/29/2021 CLINICAL DATA:  Left lower quadrant abdominal/flank pain since yesterday. Concern for diverticulitis. EXAM: CT ABDOMEN AND PELVIS WITH CONTRAST TECHNIQUE: Multidetector CT imaging of the abdomen and pelvis was performed using the standard protocol following bolus administration of intravenous contrast. RADIATION DOSE REDUCTION: This exam was performed according to the departmental dose-optimization program which includes  automated exposure control, adjustment of the mA and/or kV according to patient size and/or use of iterative reconstruction technique. CONTRAST:  175mL OMNIPAQUE IOHEXOL 300 MG/ML  SOLN COMPARISON:  Abdominopelvic CT 10/27/2017. FINDINGS: Lower chest: Lung base evaluation mildly limited by breathing artifact. The lung bases appear clear. No significant pleural or pericardial effusion. Hepatobiliary: There is chronic atrophy in the left hepatic lobe with associated mild chronic intrahepatic biliary dilatation. No focal hepatic lesion identified. The gallbladder appears normal, without gallstones or wall thickening. Pancreas: Unremarkable. No pancreatic ductal dilatation or surrounding inflammatory changes. Spleen: Normal in size without focal abnormality. Adrenals/Urinary Tract: Both adrenal glands appear normal. The kidneys appear stable without evidence of urinary tract calculus, hydronephrosis or perinephric soft tissue stranding. The right kidney is malrotated. The bladder appears unremarkable for its degree of distention. Stomach/Bowel: No enteric contrast administered. The stomach appears unremarkable for its degree of distention. The small bowel, appendix and proximal colon appear normal. There is long segment wall thickening and  surrounding inflammation associated with the proximal descending colon consistent with acute diverticulitis. There are multiple diverticula throughout the descending and sigmoid colon. No evidence of bowel obstruction, perforation or abscess. Vascular/Lymphatic: There are no enlarged abdominal or pelvic lymph nodes. Aortic and branch vessel atherosclerosis without acute vascular findings. The portal, superior mesenteric and splenic veins are patent. Reproductive: Hysterectomy.  No adnexal mass. Other: No evidence of abdominal wall mass or hernia. No ascites. Musculoskeletal: No acute or significant osseous findings. Mild lumbar spondylosis, similar to previous study. IMPRESSION: 1.  Findings are consistent with acute non complicated diverticulitis of the proximal descending colon. There are inflammatory changes in the surrounding fat, but no evidence of abscess, perforation or obstruction. 2. Underlying diffuse distal colonic diverticulosis. The appendix appears normal. 3. Stable chronic atrophy within the left hepatic lobe. 4. No acute vascular findings. Aortic Atherosclerosis (ICD10-I70.0). Electronically Signed   By: Richardean Sale M.D.   On: 03/29/2021 17:06     Assessment/Plan Principal Problem:   Diverticulitis Active Problems:   Asthma, mild intermittent   Hyperlipidemia    Acute diverticulitis -with the patient history of persistent pain with left lower quadrant tenderness we will keep patient IV fluids n.p.o. antibiotics.  History of collagenous colitis on Entocort. History of hyperlipidemia on statins.   COVID test is pending.   DVT prophylaxis: Heparin. Code Status: Full code. Family Communication: Husband at the bedside. Disposition Plan: Home. Consults called: None. Admission status: Observation.   Rise Patience MD [facility] Pager [phone].  If 7PM-7AM, please contact night-coverage [email or web address] Password [facility]  03/29/2021, 6:40 PM

## 2021-03-29 NOTE — Progress Notes (Signed)
Sending pt to ER for eval possible diverticula perforation

## 2021-03-29 NOTE — ED Provider Notes (Signed)
[facility] Provider Note    Event Date/Time   First MD Initiated Contact with Patient 03/29/21 1452     (approximate)   History   Flank Pain   HPI  Caroline Harris is a 76 y.o. female who reports onset of severe left flank pain that worsened early this morning.  Pain is low in the flank just above the left pelvic brim there was very severe earlier this morning but is now just mild to moderate      Physical Exam   Triage Vital Signs: ED Triage Vitals  Enc Vitals Group     BP 03/29/21 1312 (!) 149/68     Pulse Rate 03/29/21 1312 65     Resp 03/29/21 1312 18     Temp 03/29/21 1312 97.8 F (36.6 C)     Temp Source 03/29/21 1312 Oral     SpO2 03/29/21 1312 96 %     Weight 03/29/21 1318 157 lb (71.2 kg)     Height 03/29/21 1318 5\' 7"  (1.702 m)     Head Circumference --      Peak Flow --      Pain Score 03/29/21 1317 4     Pain Loc --      Pain Edu? --      Excl. in [place]? --     Most recent vital signs: Vitals:   03/29/21 1530 03/29/21 1600  BP: 127/64 129/69  Pulse: 63 62  Resp: 18 18  Temp:    SpO2: 99% 97%     General: Awake, no distress.  CV:  Good peripheral perfusion.  Heart regular rate and rhythm no audible murmurs Resp:  Normal effort.  Lungs are clear Abd:  No distention.  Soft bowel sounds are positive there is tenderness when the left flank to palpation and percussion.  Tenderness is not severe Extremities: No edema   ED Results / Procedures / Treatments   Labs (all labs ordered are listed, but only abnormal results are displayed) Labs Reviewed  URINALYSIS, ROUTINE W REFLEX MICROSCOPIC - Abnormal; Notable for the following components:      Result Value   Color, Urine YELLOW (*)    APPearance CLEAR (*)    Hgb urine dipstick SMALL (*)    All other components within normal limits  BASIC METABOLIC PANEL - Abnormal; Notable for the following components:   Sodium 134 (*)    Glucose, Bld 114 (*)    All other components  within normal limits  CBC - Abnormal; Notable for the following components:   WBC 13.0 (*)    All other components within normal limits     EKG     RADIOLOGY CT of the abdomen pelvis read by radiology reviewed by me shows noncomplicated diverticulitis of the left colon.  PROCEDURES:  Critical Care performed:   Procedures   MEDICATIONS ORDERED IN ED: Medications  iohexol (OMNIPAQUE) 300 MG/ML solution 100 mL (100 mLs Intravenous Contrast Given 03/29/21 1644)     IMPRESSION / MDM / ASSESSMENT AND PLAN / ED COURSE  I reviewed the triage vital signs and the nursing notes. Patient with a white count of 13,000 normal electrolytes and renal function.  According to up-to-date for noncomplicated diverticulitis in a patient over 19 such as this lady and a white count over 12,000 and admission should be done.  I do not see any other sign of intra-abdominal pathology her urine is clear and the rest of the CT is  negative. I discussed the patient with hospital doctor.  We will admit her.  I also discussed her with her husband to explain why we are admitting her.  The patient is on the cardiac monitor to evaluate for evidence of arrhythmia and/or significant heart rate changes.      FINAL CLINICAL IMPRESSION(S) / ED DIAGNOSES   Final diagnoses:  Diverticulitis of large intestine without perforation or abscess without bleeding     Rx / DC Orders   ED Discharge Orders     None        Note:  This document was prepared using Dragon voice recognition software and may include unintentional dictation errors.   Nena Polio, MD 03/29/21 [phone]

## 2021-03-29 NOTE — Telephone Encounter (Signed)
Patient returned call, I gave her the information noted from dr Marius Ditch, but she still wants cal back about this.

## 2021-03-30 DIAGNOSIS — K52831 Collagenous colitis: Secondary | ICD-10-CM | POA: Diagnosis present

## 2021-03-30 DIAGNOSIS — J4520 Mild intermittent asthma, uncomplicated: Secondary | ICD-10-CM | POA: Diagnosis present

## 2021-03-30 DIAGNOSIS — E785 Hyperlipidemia, unspecified: Secondary | ICD-10-CM | POA: Diagnosis present

## 2021-03-30 DIAGNOSIS — G2581 Restless legs syndrome: Secondary | ICD-10-CM | POA: Diagnosis present

## 2021-03-30 DIAGNOSIS — Z20822 Contact with and (suspected) exposure to covid-19: Secondary | ICD-10-CM | POA: Diagnosis present

## 2021-03-30 DIAGNOSIS — Z79899 Other long term (current) drug therapy: Secondary | ICD-10-CM | POA: Diagnosis not present

## 2021-03-30 DIAGNOSIS — K5792 Diverticulitis of intestine, part unspecified, without perforation or abscess without bleeding: Secondary | ICD-10-CM | POA: Diagnosis not present

## 2021-03-30 DIAGNOSIS — K5732 Diverticulitis of large intestine without perforation or abscess without bleeding: Secondary | ICD-10-CM | POA: Diagnosis present

## 2021-03-30 DIAGNOSIS — Z803 Family history of malignant neoplasm of breast: Secondary | ICD-10-CM | POA: Diagnosis not present

## 2021-03-30 DIAGNOSIS — M8580 Other specified disorders of bone density and structure, unspecified site: Secondary | ICD-10-CM | POA: Diagnosis present

## 2021-03-30 DIAGNOSIS — Z8249 Family history of ischemic heart disease and other diseases of the circulatory system: Secondary | ICD-10-CM | POA: Diagnosis not present

## 2021-03-30 DIAGNOSIS — F17210 Nicotine dependence, cigarettes, uncomplicated: Secondary | ICD-10-CM | POA: Diagnosis present

## 2021-03-30 DIAGNOSIS — Z7982 Long term (current) use of aspirin: Secondary | ICD-10-CM | POA: Diagnosis not present

## 2021-03-30 DIAGNOSIS — R1032 Left lower quadrant pain: Secondary | ICD-10-CM | POA: Diagnosis present

## 2021-03-30 LAB — CBC
HCT: 32.3 % — ABNORMAL LOW (ref 36.0–46.0)
Hemoglobin: 10.9 g/dL — ABNORMAL LOW (ref 12.0–15.0)
MCH: 32 pg (ref 26.0–34.0)
MCHC: 33.7 g/dL (ref 30.0–36.0)
MCV: 94.7 fL (ref 80.0–100.0)
Platelets: 211 10*3/uL (ref 150–400)
RBC: 3.41 MIL/uL — ABNORMAL LOW (ref 3.87–5.11)
RDW: 13.9 % (ref 11.5–15.5)
WBC: 7.2 10*3/uL (ref 4.0–10.5)
nRBC: 0 % (ref 0.0–0.2)

## 2021-03-30 LAB — COMPREHENSIVE METABOLIC PANEL
ALT: 12 U/L (ref 0–44)
AST: 15 U/L (ref 15–41)
Albumin: 3 g/dL — ABNORMAL LOW (ref 3.5–5.0)
Alkaline Phosphatase: 33 U/L — ABNORMAL LOW (ref 38–126)
Anion gap: 7 (ref 5–15)
BUN: 9 mg/dL (ref 8–23)
CO2: 27 mmol/L (ref 22–32)
Calcium: 8.4 mg/dL — ABNORMAL LOW (ref 8.9–10.3)
Chloride: 103 mmol/L (ref 98–111)
Creatinine, Ser: 0.67 mg/dL (ref 0.44–1.00)
GFR, Estimated: >60 mL/min (ref >60)
Glucose, Bld: 91 mg/dL (ref 70–99)
Potassium: 3.5 mmol/L (ref 3.5–5.1)
Sodium: 137 mmol/L (ref 135–145)
Total Bilirubin: 0.9 mg/dL (ref 0.3–1.2)
Total Protein: 5.4 g/dL — ABNORMAL LOW (ref 6.5–8.1)

## 2021-03-30 MED ORDER — TRAMADOL HCL 50 MG PO TABS: 50.0000 mg | ORAL_TABLET | Freq: Four times a day (QID) | ORAL | Status: DC | PRN

## 2021-03-30 MED ORDER — MORPHINE SULFATE (PF) 2 MG/ML IV SOLN
2.0000 mg | INTRAVENOUS | Status: DC | PRN
Start: 2021-03-30 — End: 2021-03-31

## 2021-03-30 NOTE — Progress Notes (Signed)
PROGRESS NOTE    Caroline Harris  QMG:[id number] DOB: 18-Jan-1946 DOA: 03/29/2021 PCP: Juline Patch, MD   Brief Narrative:  76 y.o. female with history of collagenous colitis, hyperlipidemia presented with left-sided abdominal pain.  On presentation, she was found to have mild leukocytosis and CT scan showed uncomplicated diverticulitis.  She was started on broad-spectrum antibiotics.   Assessment & Plan:   Acute diverticulitis -Presented with abdominal pain with CT findings consistent with acute uncomplicated diverticulitis.  Currently on Zosyn. -Decrease IV fluids to 75 cc an hour.  We will switch her from n.p.o. to soft diet.  If she tolerates diet today and if pain is better controlled, possible discharge home in a.m.  Leukocytosis -Resolved   History of collagenous colitis -Outpatient follow-up with GI.  Continue budesonide   Hyperlipidemia -Continue home regimen on discharge    DVT prophylaxis: Heparin subcutaneous Code Status: Full Family Communication: None at bedside Disposition Plan: Status is: Observation The patient will require care spanning > 2 midnights and should be moved to inpatient because: Of need for IV antibiotics and fluids  Consultants: None  Procedures: None  Antimicrobials: Zosyn from 03/29/2021 onwards   Subjective: Patient seen and examined at bedside.  Complains of intermittent lower abdominal pain but improving.  Denies any nausea, fever or chest pain. Objective: Vitals:   03/29/21 1856 03/29/21 2030 03/30/21 0503 03/30/21 0737  BP: (!) 144/67 (!) 149/74 129/64 130/62  Pulse: 65 62 63 (!) 57  Resp: 20 20 20 18   Temp: 98.1 F (36.7 C) 98.4 F (36.9 C) 98.1 F (36.7 C) 98.5 F (36.9 C)  TempSrc:  Oral Oral Oral  SpO2: 97% 100% 96% 95%  Weight:      Height:        Intake/Output Summary (Last 24 hours) at 03/30/2021 1330 Last data filed at 03/30/2021 0500 Gross per 24 hour  Intake 1090.75 ml  Output 900 ml  Net 190.75 ml   Filed  Weights   03/29/21 1318  Weight: 71.2 kg    Examination:  General exam: Appears calm and comfortable.  Currently on room air. Respiratory system: Bilateral decreased breath sounds at bases Cardiovascular system: S1 & S2 heard, Rate controlled Gastrointestinal system: Abdomen is nondistended, soft and mildly tender in the left lower quadrant.  Normal bowel sounds heard. Extremities: No cyanosis, clubbing, edema  Central nervous system: Alert and oriented. No focal neurological deficits. Moving extremities Skin: No rashes, lesions or ulcers Psychiatry: Judgement and insight appear normal. Mood & affect appropriate.     Data Reviewed: I have personally reviewed following labs and imaging studies  CBC: Recent Labs  Lab 03/29/21 1317 03/30/21 0347  WBC 13.0* 7.2  HGB 12.9 10.9*  HCT 39.0 32.3*  MCV 96.1 94.7  PLT 260 326   Basic Metabolic Panel: Recent Labs  Lab 03/29/21 1317 03/30/21 0347  NA 134* 137  K 3.9 3.5  CL 98 103  CO2 22 27  GLUCOSE 114* 91  BUN 13 9  CREATININE 0.52 0.67  CALCIUM 9.0 8.4*   GFR: Estimated Creatinine Clearance: 58.2 mL/min (by C-G formula based on SCr of 0.67 mg/dL). Liver Function Tests: Recent Labs  Lab 03/30/21 0347  AST 15  ALT 12  ALKPHOS 33*  BILITOT 0.9  PROT 5.4*  ALBUMIN 3.0*   No results for input(s): LIPASE, AMYLASE in the last 168 hours. No results for input(s): AMMONIA in the last 168 hours. Coagulation Profile: No results for input(s): INR, PROTIME in the last  168 hours. Cardiac Enzymes: No results for input(s): CKTOTAL, CKMB, CKMBINDEX, TROPONINI in the last 168 hours. BNP (last 3 results) No results for input(s): PROBNP in the last 8760 hours. HbA1C: No results for input(s): HGBA1C in the last 72 hours. CBG: No results for input(s): GLUCAP in the last 168 hours. Lipid Profile: No results for input(s): CHOL, HDL, LDLCALC, TRIG, CHOLHDL, LDLDIRECT in the last 72 hours. Thyroid Function Tests: No results for  input(s): TSH, T4TOTAL, FREET4, T3FREE, THYROIDAB in the last 72 hours. Anemia Panel: No results for input(s): VITAMINB12, FOLATE, FERRITIN, TIBC, IRON, RETICCTPCT in the last 72 hours. Sepsis Labs: No results for input(s): PROCALCITON, LATICACIDVEN in the last 168 hours.  Recent Results (from the past 240 hour(s))  Resp Panel by RT-PCR (Flu A&B, Covid) Nasopharyngeal Swab     Status: None   Collection Time: 03/29/21  6:27 PM   Specimen: Nasopharyngeal Swab; Nasopharyngeal(NP) swabs in vial transport medium  Result Value Ref Range Status   SARS Coronavirus 2 by RT PCR NEGATIVE NEGATIVE Final    Comment: (NOTE) SARS-CoV-2 target nucleic acids are NOT DETECTED.  The SARS-CoV-2 RNA is generally detectable in upper respiratory specimens during the acute phase of infection. The lowest concentration of SARS-CoV-2 viral copies this assay can detect is 138 copies/mL. A negative result does not preclude SARS-Cov-2 infection and should not be used as the sole basis for treatment or other patient management decisions. A negative result may occur with  improper specimen collection/handling, submission of specimen other than nasopharyngeal swab, presence of viral mutation(s) within the areas targeted by this assay, and inadequate number of viral copies(<138 copies/mL). A negative result must be combined with clinical observations, patient history, and epidemiological information. The expected result is Negative.  Fact Sheet for Patients:  EntrepreneurPulse.com.au  Fact Sheet for Healthcare Providers:  IncredibleEmployment.be  This test is no t yet approved or cleared by the [place] FDA and  has been authorized for detection and/or diagnosis of SARS-CoV-2 by FDA under an Emergency Use Authorization (EUA). This EUA will remain  in effect (meaning this test can be used) for the duration of the COVID-19 declaration under Section 564(b)(1) of the Act,  21 U.S.C.section 360bbb-3(b)(1), unless the authorization is terminated  or revoked sooner.       Influenza A by PCR NEGATIVE NEGATIVE Final   Influenza B by PCR NEGATIVE NEGATIVE Final    Comment: (NOTE) The Xpert Xpress SARS-CoV-2/FLU/RSV plus assay is intended as an aid in the diagnosis of influenza from Nasopharyngeal swab specimens and should not be used as a sole basis for treatment. Nasal washings and aspirates are unacceptable for Xpert Xpress SARS-CoV-2/FLU/RSV testing.  Fact Sheet for Patients: EntrepreneurPulse.com.au  Fact Sheet for Healthcare Providers: IncredibleEmployment.be  This test is not yet approved or cleared by the [place] FDA and has been authorized for detection and/or diagnosis of SARS-CoV-2 by FDA under an Emergency Use Authorization (EUA). This EUA will remain in effect (meaning this test can be used) for the duration of the COVID-19 declaration under Section 564(b)(1) of the Act, 21 U.S.C. section 360bbb-3(b)(1), unless the authorization is terminated or revoked.  Performed at [facility], [address] [place], [place] [address]          Radiology Studies: CT ABDOMEN PELVIS W CONTRAST  Result Date: 03/29/2021 CLINICAL DATA:  Left lower quadrant abdominal/flank pain since yesterday. Concern for diverticulitis. EXAM: CT ABDOMEN AND PELVIS WITH CONTRAST TECHNIQUE: Multidetector CT imaging of the abdomen and pelvis was  performed using the standard protocol following bolus administration of intravenous contrast. RADIATION DOSE REDUCTION: This exam was performed according to the departmental dose-optimization program which includes automated exposure control, adjustment of the mA and/or kV according to patient size and/or use of iterative reconstruction technique. CONTRAST:  141mL OMNIPAQUE IOHEXOL 300 MG/ML  SOLN COMPARISON:  Abdominopelvic CT 10/27/2017. FINDINGS: Lower chest: Lung base evaluation  mildly limited by breathing artifact. The lung bases appear clear. No significant pleural or pericardial effusion. Hepatobiliary: There is chronic atrophy in the left hepatic lobe with associated mild chronic intrahepatic biliary dilatation. No focal hepatic lesion identified. The gallbladder appears normal, without gallstones or wall thickening. Pancreas: Unremarkable. No pancreatic ductal dilatation or surrounding inflammatory changes. Spleen: Normal in size without focal abnormality. Adrenals/Urinary Tract: Both adrenal glands appear normal. The kidneys appear stable without evidence of urinary tract calculus, hydronephrosis or perinephric soft tissue stranding. The right kidney is malrotated. The bladder appears unremarkable for its degree of distention. Stomach/Bowel: No enteric contrast administered. The stomach appears unremarkable for its degree of distention. The small bowel, appendix and proximal colon appear normal. There is long segment wall thickening and surrounding inflammation associated with the proximal descending colon consistent with acute diverticulitis. There are multiple diverticula throughout the descending and sigmoid colon. No evidence of bowel obstruction, perforation or abscess. Vascular/Lymphatic: There are no enlarged abdominal or pelvic lymph nodes. Aortic and branch vessel atherosclerosis without acute vascular findings. The portal, superior mesenteric and splenic veins are patent. Reproductive: Hysterectomy.  No adnexal mass. Other: No evidence of abdominal wall mass or hernia. No ascites. Musculoskeletal: No acute or significant osseous findings. Mild lumbar spondylosis, similar to previous study. IMPRESSION: 1. Findings are consistent with acute non complicated diverticulitis of the proximal descending colon. There are inflammatory changes in the surrounding fat, but no evidence of abscess, perforation or obstruction. 2. Underlying diffuse distal colonic diverticulosis. The  appendix appears normal. 3. Stable chronic atrophy within the left hepatic lobe. 4. No acute vascular findings. Aortic Atherosclerosis (ICD10-I70.0). Electronically Signed   By: Richardean Sale M.D.   On: 03/29/2021 17:06        Scheduled Meds:  budesonide  3 mg Oral Daily   heparin  5,000 Units Subcutaneous Q8H   melatonin  10 mg Oral QHS   Continuous Infusions:  lactated ringers 125 mL/hr at 03/30/21 1050   piperacillin-tazobactam (ZOSYN)  IV 3.375 g (03/30/21 0533)          Aline August, MD [facility] 03/30/2021, 1:30 PM

## 2021-03-30 NOTE — Discharge Summary (Signed)
Physician Discharge Summary   Patient: Caroline Harris MRN: [id number] DOB: 16-Oct-1945  Admit date:     03/29/2021  Discharge date: 03/31/21  Discharge Physician: Aline August   PCP: Juline Patch, MD   Recommendations at discharge:   Follow-up with PCP within a week Outpatient follow-up with GI Follow-up in the ED if symptoms worsen or new appears  Hospital Course: 76 y.o. female with history of collagenous colitis, hyperlipidemia presented with left-sided abdominal pain.  On presentation, she was found to have mild leukocytosis and CT scan showed uncomplicated diverticulitis.  She was started on broad-spectrum antibiotics.  During the hospitalization, her symptoms are improving.  Diet has been advanced and she has tolerated.  She will be discharged home today on oral ciprofloxacin and Flagyl for 10 days.  Will need outpatient follow-up with GI in 4 to 6 weeks.  Follow-up with PCP within a week.  Discharge diagnosis and assessment and Plan:  Acute diverticulitis -Presented with abdominal pain with CT findings consistent with acute uncomplicated diverticulitis.  Currently on Zosyn. - During the hospitalization, her symptoms are improving.  Diet has been advanced and she has tolerated.  She will be discharged home today on oral ciprofloxacin and Flagyl for 10 days.  Will need outpatient follow-up with GI in 4 to 6 weeks.  Follow-up with PCP within a week.  History of collagenous colitis -Outpatient follow-up with GI.  Continue budesonide  Hyperlipidemia -Continue statin       Consultants: None Procedures performed: None Disposition: Home Diet recommendation:  Cardiac diet  DISCHARGE MEDICATION: Allergies as of 03/31/2021   No Known Allergies      Medication List     TAKE these medications    aspirin 81 MG tablet Take 81 mg by mouth daily.   budesonide 3 MG 24 hr capsule Commonly known as: ENTOCORT EC TAKE ONE CAPSULE BY MOUTH EVERY MORNING   calcium carbonate  1250 MG capsule Take 1,250 mg by mouth 2 (two) times daily with a meal.   CENTRUM SILVER ADULT 50+ PO Take 1 capsule by mouth daily at 6 (six) AM.   ciprofloxacin 500 MG tablet Commonly known as: Cipro Take 1 tablet (500 mg total) by mouth 2 (two) times daily for 10 days.   lovastatin 20 MG tablet Commonly known as: MEVACOR Take 1 tablet (20 mg total) by mouth daily.   Magnesium 400 MG Tabs   Melatonin 10 MG Tabs Take 1 tablet by mouth at bedtime.   metroNIDAZOLE 500 MG tablet Commonly known as: Flagyl Take 1 tablet (500 mg total) by mouth 3 (three) times daily for 10 days.   montelukast 10 MG tablet Commonly known as: SINGULAIR TAKE 1 TABLET(10 MG) BY MOUTH DAILY   traMADol 50 MG tablet Commonly known as: Ultram Take 1 tablet (50 mg total) by mouth every 6 (six) hours as needed for moderate pain.         Follow-up Information     Juline Patch, MD. Schedule an appointment as soon as possible for a visit in 1 week(s).   Specialty: Family Medicine Contact information: [address] [place] [address] [phone]         Lin Landsman, MD. Schedule an appointment as soon as possible for a visit in 2 week(s).   Specialty: Gastroenterology Contact information: [place] [address] [phone]               Subjective: Patient seen and examined at  bedside.  States that her pain has much improved and she is tolerating diet.  She feels okay to go home today.  No overnight fever, nausea, vomiting reported.  Discharge Exam: Filed Weights   03/29/21 1318  Weight: 71.2 kg   General: No acute distress, currently on room air respiratory: Bilateral decreased breath sounds at bases with some scattered crackles CVS: S1-S2 heard, rate controlled Abdominal: Soft, currently nontender, nondistended, no organomegaly, bowel sounds heard Extremities: No cyanosis, clubbing, edema   Condition at discharge: good  The  results of significant diagnostics from this hospitalization (including imaging, microbiology, ancillary and laboratory) are listed below for reference.   Imaging Studies: CT ABDOMEN PELVIS W CONTRAST  Result Date: 03/29/2021 CLINICAL DATA:  Left lower quadrant abdominal/flank pain since yesterday. Concern for diverticulitis. EXAM: CT ABDOMEN AND PELVIS WITH CONTRAST TECHNIQUE: Multidetector CT imaging of the abdomen and pelvis was performed using the standard protocol following bolus administration of intravenous contrast. RADIATION DOSE REDUCTION: This exam was performed according to the departmental dose-optimization program which includes automated exposure control, adjustment of the mA and/or kV according to patient size and/or use of iterative reconstruction technique. CONTRAST:  12mL OMNIPAQUE IOHEXOL 300 MG/ML  SOLN COMPARISON:  Abdominopelvic CT 10/27/2017. FINDINGS: Lower chest: Lung base evaluation mildly limited by breathing artifact. The lung bases appear clear. No significant pleural or pericardial effusion. Hepatobiliary: There is chronic atrophy in the left hepatic lobe with associated mild chronic intrahepatic biliary dilatation. No focal hepatic lesion identified. The gallbladder appears normal, without gallstones or wall thickening. Pancreas: Unremarkable. No pancreatic ductal dilatation or surrounding inflammatory changes. Spleen: Normal in size without focal abnormality. Adrenals/Urinary Tract: Both adrenal glands appear normal. The kidneys appear stable without evidence of urinary tract calculus, hydronephrosis or perinephric soft tissue stranding. The right kidney is malrotated. The bladder appears unremarkable for its degree of distention. Stomach/Bowel: No enteric contrast administered. The stomach appears unremarkable for its degree of distention. The small bowel, appendix and proximal colon appear normal. There is long segment wall thickening and surrounding inflammation associated  with the proximal descending colon consistent with acute diverticulitis. There are multiple diverticula throughout the descending and sigmoid colon. No evidence of bowel obstruction, perforation or abscess. Vascular/Lymphatic: There are no enlarged abdominal or pelvic lymph nodes. Aortic and branch vessel atherosclerosis without acute vascular findings. The portal, superior mesenteric and splenic veins are patent. Reproductive: Hysterectomy.  No adnexal mass. Other: No evidence of abdominal wall mass or hernia. No ascites. Musculoskeletal: No acute or significant osseous findings. Mild lumbar spondylosis, similar to previous study. IMPRESSION: 1. Findings are consistent with acute non complicated diverticulitis of the proximal descending colon. There are inflammatory changes in the surrounding fat, but no evidence of abscess, perforation or obstruction. 2. Underlying diffuse distal colonic diverticulosis. The appendix appears normal. 3. Stable chronic atrophy within the left hepatic lobe. 4. No acute vascular findings. Aortic Atherosclerosis (ICD10-I70.0). Electronically Signed   By: Richardean Sale M.D.   On: 03/29/2021 17:06    Microbiology: Results for orders placed or performed during the hospital encounter of 03/29/21  Resp Panel by RT-PCR (Flu A&B, Covid) Nasopharyngeal Swab     Status: None   Collection Time: 03/29/21  6:27 PM   Specimen: Nasopharyngeal Swab; Nasopharyngeal(NP) swabs in vial transport medium  Result Value Ref Range Status   SARS Coronavirus 2 by RT PCR NEGATIVE NEGATIVE Final    Comment: (NOTE) SARS-CoV-2 target nucleic acids are NOT DETECTED.  The SARS-CoV-2 RNA is generally detectable in upper  respiratory specimens during the acute phase of infection. The lowest concentration of SARS-CoV-2 viral copies this assay can detect is 138 copies/mL. A negative result does not preclude SARS-Cov-2 infection and should not be used as the sole basis for treatment or other patient  management decisions. A negative result may occur with  improper specimen collection/handling, submission of specimen other than nasopharyngeal swab, presence of viral mutation(s) within the areas targeted by this assay, and inadequate number of viral copies(<138 copies/mL). A negative result must be combined with clinical observations, patient history, and epidemiological information. The expected result is Negative.  Fact Sheet for Patients:  EntrepreneurPulse.com.au  Fact Sheet for Healthcare Providers:  IncredibleEmployment.be  This test is no t yet approved or cleared by the [place] FDA and  has been authorized for detection and/or diagnosis of SARS-CoV-2 by FDA under an Emergency Use Authorization (EUA). This EUA will remain  in effect (meaning this test can be used) for the duration of the COVID-19 declaration under Section 564(b)(1) of the Act, 21 U.S.C.section 360bbb-3(b)(1), unless the authorization is terminated  or revoked sooner.       Influenza A by PCR NEGATIVE NEGATIVE Final   Influenza B by PCR NEGATIVE NEGATIVE Final    Comment: (NOTE) The Xpert Xpress SARS-CoV-2/FLU/RSV plus assay is intended as an aid in the diagnosis of influenza from Nasopharyngeal swab specimens and should not be used as a sole basis for treatment. Nasal washings and aspirates are unacceptable for Xpert Xpress SARS-CoV-2/FLU/RSV testing.  Fact Sheet for Patients: EntrepreneurPulse.com.au  Fact Sheet for Healthcare Providers: IncredibleEmployment.be  This test is not yet approved or cleared by the [place] FDA and has been authorized for detection and/or diagnosis of SARS-CoV-2 by FDA under an Emergency Use Authorization (EUA). This EUA will remain in effect (meaning this test can be used) for the duration of the COVID-19 declaration under Section 564(b)(1) of the Act, 21 U.S.C. section 360bbb-3(b)(1),  unless the authorization is terminated or revoked.  Performed at [facility] [address] [place], [place] [address]     Labs: CBC: Recent Labs  Lab 03/29/21 1317 03/30/21 0347  WBC 13.0* 7.2  HGB 12.9 10.9*  HCT 39.0 32.3*  MCV 96.1 94.7  PLT 260 600   Basic Metabolic Panel: Recent Labs  Lab 03/29/21 1317 03/30/21 0347  NA 134* 137  K 3.9 3.5  CL 98 103  CO2 22 27  GLUCOSE 114* 91  BUN 13 9  CREATININE 0.52 0.67  CALCIUM 9.0 8.4*   Liver Function Tests: Recent Labs  Lab 03/30/21 0347  AST 15  ALT 12  ALKPHOS 33*  BILITOT 0.9  PROT 5.4*  ALBUMIN 3.0*   CBG: No results for input(s): GLUCAP in the last 168 hours.  Discharge time spent: Greater than 30 minutes.  Signed: Aline August, MD [facility] 03/30/2021

## 2021-03-30 NOTE — Plan of Care (Signed)

## 2021-03-31 ENCOUNTER — Encounter: Payer: Self-pay | Admitting: Family Medicine

## 2021-03-31 DIAGNOSIS — J4520 Mild intermittent asthma, uncomplicated: Secondary | ICD-10-CM

## 2021-03-31 DIAGNOSIS — E785 Hyperlipidemia, unspecified: Secondary | ICD-10-CM

## 2021-03-31 LAB — BASIC METABOLIC PANEL
Anion gap: 6 (ref 5–15)
BUN: 13 mg/dL (ref 8–23)
CO2: 26 mmol/L (ref 22–32)
Calcium: 8.4 mg/dL — ABNORMAL LOW (ref 8.9–10.3)
Chloride: 105 mmol/L (ref 98–111)
Creatinine, Ser: 0.55 mg/dL (ref 0.44–1.00)
GFR, Estimated: >60 mL/min (ref >60)
Glucose, Bld: 102 mg/dL — ABNORMAL HIGH (ref 70–99)
Potassium: 3.4 mmol/L — ABNORMAL LOW (ref 3.5–5.1)
Sodium: 137 mmol/L (ref 135–145)

## 2021-03-31 LAB — C-REACTIVE PROTEIN: CRP: 7 mg/dL — ABNORMAL HIGH (ref <1.0)

## 2021-03-31 LAB — CBC WITH DIFFERENTIAL/PLATELET
Abs Immature Granulocytes: 0.01 10*3/uL (ref 0.00–0.07)
Basophils Absolute: 0.1 10*3/uL (ref 0.0–0.1)
Basophils Relative: 1 %
Eosinophils Absolute: 0.2 10*3/uL (ref 0.0–0.5)
Eosinophils Relative: 3 %
HCT: 33.8 % — ABNORMAL LOW (ref 36.0–46.0)
Hemoglobin: 11.2 g/dL — ABNORMAL LOW (ref 12.0–15.0)
Immature Granulocytes: 0 %
Lymphocytes Relative: 34 %
Lymphs Abs: 1.8 10*3/uL (ref 0.7–4.0)
MCH: 30.9 pg (ref 26.0–34.0)
MCHC: 33.1 g/dL (ref 30.0–36.0)
MCV: 93.4 fL (ref 80.0–100.0)
Monocytes Absolute: 0.4 10*3/uL (ref 0.1–1.0)
Monocytes Relative: 8 %
Neutro Abs: 2.8 10*3/uL (ref 1.7–7.7)
Neutrophils Relative %: 54 %
Platelets: 231 10*3/uL (ref 150–400)
RBC: 3.62 MIL/uL — ABNORMAL LOW (ref 3.87–5.11)
RDW: 13.8 % (ref 11.5–15.5)
WBC: 5.3 10*3/uL (ref 4.0–10.5)
nRBC: 0 % (ref 0.0–0.2)

## 2021-03-31 LAB — MAGNESIUM: Magnesium: 2.2 mg/dL (ref 1.7–2.4)

## 2021-03-31 MED ORDER — METRONIDAZOLE 500 MG PO TABS
500.0000 mg | ORAL_TABLET | Freq: Three times a day (TID) | ORAL | 0 refills | Status: AC
Start: 2021-03-31 — End: 2021-04-10

## 2021-03-31 MED ORDER — CIPROFLOXACIN HCL 500 MG PO TABS: 500.0000 mg | ORAL_TABLET | Freq: Two times a day (BID) | ORAL | 0 refills | Status: DC

## 2021-03-31 MED ORDER — TRAMADOL HCL 50 MG PO TABS: 50.0000 mg | ORAL_TABLET | Freq: Four times a day (QID) | ORAL | 0 refills | Status: DC | PRN

## 2021-03-31 MED ORDER — POTASSIUM CHLORIDE CRYS ER 20 MEQ PO TBCR
40.0000 meq | EXTENDED_RELEASE_TABLET | Freq: Once | ORAL | Status: AC
  Administered 2021-03-31: 40 meq via ORAL
  Filled 2021-03-31: qty 2

## 2021-03-31 NOTE — Progress Notes (Signed)
Pt discharged to home. DC instructions given with husband at bedside. No concerns voiced. Pt left unit ambulatory accompanied by husband . Preferred not to be transported in wheelchair. Encouraged to stop by pharmacy and pick up meds that were e-prescribed by Provider. Voiced understanding. Let unit in stable condition.

## 2021-03-31 NOTE — TOC Transition Note (Signed)
Transition of Care [facility]) - CM/SW Discharge Note   Patient Details  Name: Caroline Harris MRN: [id number] Date of Birth: August 02, 1945  [facility] CM/SW Contact:  Izola Price, RN Phone Number: 03/31/2021, 10:22 AM   Clinical Narrative:  2/12: Discharge orders in for today, did not discharge yesterday. Provider provided follow up plan as outlined below. Patient has insurance and a PCP.  No TOC needs identified for discharge plan.  Per provider discharge summary patient is to:  Follow-up with PCP within a week (Dr. Otilio Miu) Outpatient follow-up with GI Follow-up in the ED if symptoms worsen or new appears Simmie Davies RN CM   Final next level of care: Home/Self Care Barriers to Discharge: Barriers Resolved   Patient Goals and CMS Choice     Choice offered to / list presented to : NA  Discharge Placement                       Discharge Plan and Services                DME Arranged: N/A DME Agency: NA       HH Arranged: NA HH Agency: NA        Social Determinants of Health (SDOH) Interventions     Readmission Risk Interventions No flowsheet data found.

## 2021-04-01 ENCOUNTER — Telehealth: Payer: Self-pay

## 2021-04-01 NOTE — Telephone Encounter (Signed)
Transition Care Management Follow-up Telephone Call Date of discharge and from where: 03/31/21 [facility] How have you been since you were released from the hospital? Pt states she is doing well Any questions or concerns? No  Items Reviewed: Did the pt receive and understand the discharge instructions provided? Yes  Medications obtained and verified? Yes  Other? No  Any new allergies since your discharge? No  Dietary orders reviewed? Yes Do you have support at home? Yes   Home Care and Equipment/Supplies: Were home health services ordered? no   Were any new equipment or medical supplies ordered?  No   Functional Questionnaire: (I = Independent and D = Dependent) ADLs: I  Bathing/Dressing- I  Meal Prep- I  Eating- I  Maintaining continence- I  Transferring/Ambulation- I  Managing Meds- I  Follow up appointments reviewed:  PCP Hospital f/u appt confirmed? Yes  Scheduled to see Dr. Ronnald Ramp on 04/09/21 @ 10:40. [facility] f/u appt confirmed? Yes  Scheduled to see Dr. Marius Ditch on 04/03/21 @ 1:15. Are transportation arrangements needed? No  If their condition worsens, is the pt aware to call PCP or go to the Emergency Dept.? Yes Was the patient provided with contact information for the PCP's office or ED? Yes Was to pt encouraged to call back with questions or concerns? Yes

## 2021-04-02 ENCOUNTER — Encounter: Payer: Self-pay | Admitting: Gastroenterology

## 2021-04-03 ENCOUNTER — Telehealth: Payer: Self-pay

## 2021-04-03 ENCOUNTER — Other Ambulatory Visit: Payer: Self-pay

## 2021-04-03 ENCOUNTER — Encounter: Payer: Self-pay | Admitting: Gastroenterology

## 2021-04-03 ENCOUNTER — Ambulatory Visit (INDEPENDENT_AMBULATORY_CARE_PROVIDER_SITE_OTHER): Payer: PPO | Admitting: Gastroenterology

## 2021-04-03 VITALS — BP 147/84 | HR 98 | Temp 98.0°F | Ht 67.0 in | Wt 157.5 lb

## 2021-04-03 DIAGNOSIS — K52831 Collagenous colitis: Secondary | ICD-10-CM | POA: Diagnosis not present

## 2021-04-03 DIAGNOSIS — R197 Diarrhea, unspecified: Secondary | ICD-10-CM | POA: Diagnosis not present

## 2021-04-03 NOTE — Telephone Encounter (Signed)
Have her stop the ciprofloxacin and continue only metronidazole. She is already taking 1 pill a day budesonide and she will increase to 3 pills daily.  RV

## 2021-04-03 NOTE — Telephone Encounter (Signed)
Patient verbalized understanding of results  

## 2021-04-03 NOTE — Progress Notes (Signed)
Caroline Darby, MD [address]  [place]  [place], [place] [address]  Main: [phone]  Fax: [phone]    Gastroenterology Consultation  Referring Provider:     Juline Patch, MD Primary Care Physician:  Juline Patch, MD Primary Gastroenterologist:  Dr. Vira Agar Reason for Consultation:   Hospital follow-up, acute diverticulitis, collagenous colitis        HPI:   Caroline Harris is a 76 y.o. female referred by Dr. Juline Patch, MD  for consultation & management of collagenous colitis.  Patient is diagnosed with collagenous colitis based on colonoscopy in 04/2018 after she was having chronic nonbloody diarrhea since 09/2017.  She started on Entocort 9 mg daily which has resulted in resolution of symptoms.  She is currently on 6 mg daily, in clinical remission.  She reports having 1 bowel movement daily.  She does not have any other GI symptoms.  She wanted to discuss about long-term management of collagenous colitis.  Patient is also found to have normocytic anemia.  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  She is currently maintained on budesonide 3 mg daily.  She tried to stop the medication last year which resulted in return of diarrhea.  He does not have any other concerns today.  She wants to know when she is due for next surveillance colonoscopy  Follow-up visit 04/03/2021 Patient was admitted to [facility] on 03/29/2021 secondary to left-sided abdominal pain, CT revealed acute uncomplicated proximal descending colon diverticulitis, she was admitted, received Zosyn and was discharged on Cipro and Flagyl for 10 days.  Patient reports that her abdominal pain has subsided.  However, for the last 2 to 3 days, she has been having nonbloody diarrhea with abdominal bloating.  She continues to take the antibiotics.  She is also taking  budesonide 3 mg 1 pill daily.  She is planning to make a trip to the mountains tomorrow to meet her grandchildren.  She did not try Imodium  NSAIDs: None  Antiplts/Anticoagulants/Anti thrombotics: None  GI Procedures:  Colonoscopy 05/14/18 + Collagenous Colitis, Serrated Polyp, Repeat 5 years noted    Past Medical History:  Diagnosis Date   Colitis    Collagenous colitis    Diverticulitis    Hyperlipidemia    Osteopenia    Restless leg     Past Surgical History:  Procedure Laterality Date   ABDOMINAL HYSTERECTOMY     COLONOSCOPY     TONSILLECTOMY      Current Outpatient Medications:    aspirin 81 MG tablet, Take 81 mg by mouth daily., Disp: , Rfl:    budesonide (ENTOCORT EC) 3 MG 24 hr capsule, TAKE ONE CAPSULE BY MOUTH EVERY MORNING, Disp: 30 capsule, Rfl: 5   calcium carbonate 1250 MG capsule, Take 1,250 mg by mouth 2 (two) times daily with a meal., Disp: , Rfl:    ciprofloxacin (CIPRO) 500 MG tablet, Take 1 tablet (500 mg total) by mouth 2 (two) times daily for 10 days., Disp: 20 tablet, Rfl: 0   lovastatin (MEVACOR) 20 MG tablet, Take 1 tablet (20 mg total) by mouth daily., Disp: 90 tablet, Rfl: 1   Magnesium 400 MG TABS, , Disp: , Rfl:    Melatonin 10 MG TABS, Take 1 tablet by mouth at bedtime., Disp: , Rfl:    metroNIDAZOLE (FLAGYL) 500 MG tablet, Take 1  tablet (500 mg total) by mouth 3 (three) times daily for 10 days., Disp: 30 tablet, Rfl: 0   montelukast (SINGULAIR) 10 MG tablet, TAKE 1 TABLET(10 MG) BY MOUTH DAILY, Disp: 90 tablet, Rfl: 1   Multiple Vitamins-Minerals (CENTRUM SILVER ADULT 50+ PO), Take 1 capsule by mouth daily at 6 (six) AM., Disp: , Rfl:    Family History  Problem Relation Age of Onset   Breast cancer Mother 67   Heart attack Father      Social History   Tobacco Use   Smoking status: Every Day    Packs/day: 0.25    Years: 50.00    Pack years: 12.50    Types: Cigarettes    Start date: 02/17/1958   Smokeless tobacco: Never   Tobacco  comments:    aware needs to stop   Vaping Use   Vaping Use: Never used  Substance Use Topics   Alcohol use: Yes    Alcohol/week: 1.0 standard drink    Types: 1 Glasses of wine per week   Drug use: No    Allergies as of 04/03/2021   (No Known Allergies)    Review of Systems:    All systems reviewed and negative except where noted in HPI.   Physical Exam:  BP (!) 147/84 (BP Location: Left Arm, Patient Position: Sitting, Cuff Size: Large)    Pulse 98    Temp 98 F (36.7 C) (Oral)    Ht 5\' 7"  (1.702 m)    Wt 157 lb 8 oz (71.4 kg)    BMI 24.67 kg/m  No LMP recorded. Patient has had a hysterectomy.  General:   Alert,  Well-developed, well-nourished, pleasant and cooperative in NAD Head:  Normocephalic and atraumatic. Eyes:  Sclera clear, no icterus.   Conjunctiva pink. Ears:  Normal auditory acuity. Nose:  No deformity, discharge, or lesions. Mouth:  No deformity or lesions,oropharynx pink & moist. Neck:  Supple; no masses or thyromegaly. Lungs:  Respirations even and unlabored.  Clear throughout to auscultation.   No wheezes, crackles, or rhonchi. No acute distress. Heart:  Regular rate and rhythm; no murmurs, clicks, rubs, or gallops. Abdomen:  Normal bowel sounds. Soft, non-tender and non-distended without masses, hepatosplenomegaly or hernias noted.  No guarding or rebound tenderness.   Rectal: Not performed Msk:  Symmetrical without gross deformities. Good, equal movement & strength bilaterally. Pulses:  Normal pulses noted. Extremities:  No clubbing or edema.  No cyanosis. Neurologic:  Alert and oriented x3;  grossly normal neurologically. Skin:  Intact without significant lesions or rashes. No jaundice. Psych:  Alert and cooperative. Normal mood and affect.  Imaging Studies: Reviewed  Assessment and Plan:   [name] is a 76 y.o. female with history of chronic tobacco use, restless leg syndrome, history of collagenous colitis on budesonide, recent episode of acute  uncomplicated proximal descending colon diverticulitis, treated with antibiotics.  Patient made an office visit to see me due to new onset of diarrhea  Nonbloody diarrhea Advised patient to increase budesonide to 3 pills daily.  If diarrhea does not subside, recommend stool studies to rule out infection Lactose-free diet  History of acute uncomplicated left-sided diverticulitis Patient will stop ciprofloxacin since patient is on budesonide due to drug drug interaction She will continue metronidazole  Serrated polyp of the colon, less than 1 cm, Recommend surveillance colonoscopy in 04/2023   Follow up as needed   Caroline Darby, MD

## 2021-04-03 NOTE — Telephone Encounter (Signed)
Patient called requesting a call back... Pt went to the pharmacy to pick up refill of Budesonide... She reports the pharmacist advised her to reach out to physician as there is a warning on taking Budesonide and Cipro... It did say a minor risk... please advise

## 2021-04-08 ENCOUNTER — Ambulatory Visit: Payer: BC Managed Care – PPO | Admitting: Family Medicine

## 2021-04-09 ENCOUNTER — Encounter: Payer: Self-pay | Admitting: Gastroenterology

## 2021-04-09 ENCOUNTER — Ambulatory Visit (INDEPENDENT_AMBULATORY_CARE_PROVIDER_SITE_OTHER): Payer: PPO | Admitting: Family Medicine

## 2021-04-09 ENCOUNTER — Other Ambulatory Visit: Payer: Self-pay

## 2021-04-09 ENCOUNTER — Encounter: Payer: Self-pay | Admitting: Family Medicine

## 2021-04-09 VITALS — BP 138/100 | HR 84 | Ht 67.0 in | Wt 156.0 lb

## 2021-04-09 DIAGNOSIS — R030 Elevated blood-pressure reading, without diagnosis of hypertension: Secondary | ICD-10-CM | POA: Diagnosis not present

## 2021-04-09 DIAGNOSIS — K5792 Diverticulitis of intestine, part unspecified, without perforation or abscess without bleeding: Secondary | ICD-10-CM

## 2021-04-09 DIAGNOSIS — R197 Diarrhea, unspecified: Secondary | ICD-10-CM

## 2021-04-09 NOTE — Progress Notes (Signed)
Date:  04/09/2021   Name:  Caroline Harris   DOB:  February 22, 1945   MRN:  [id number]   Chief Complaint: Follow-up (Liquid diarrhea has turned to soft stools and Dr Marius Ditch has ordered stool studies on her.)  Patient is a 76 year old female who presents for a followup exam of diverticulitis exam. The patient reports the following problems: improving. Health maintenance has been reviewed up to date.     Lab Results  Component Value Date   NA 137 03/31/2021   K 3.4 (L) 03/31/2021   CO2 26 03/31/2021   GLUCOSE 102 (H) 03/31/2021   BUN 13 03/31/2021   CREATININE 0.55 03/31/2021   CALCIUM 8.4 (L) 03/31/2021   EGFR 84 08/24/2020   GFRNONAA >60 03/31/2021   Lab Results  Component Value Date   CHOL 178 02/26/2021   HDL 83 02/26/2021   LDLCALC 80 02/26/2021   TRIG 81 02/26/2021   CHOLHDL 1.7 11/05/2018   Lab Results  Component Value Date   TSH 1.120 06/01/2017   No results found for: HGBA1C Lab Results  Component Value Date   WBC 5.3 03/31/2021   HGB 11.2 (L) 03/31/2021   HCT 33.8 (L) 03/31/2021   MCV 93.4 03/31/2021   PLT 231 03/31/2021   Lab Results  Component Value Date   ALT 12 03/30/2021   AST 15 03/30/2021   ALKPHOS 33 (L) 03/30/2021   BILITOT 0.9 03/30/2021   No results found for: 25OHVITD2, 25OHVITD3, VD25OH   Review of Systems  Constitutional:  Negative for chills and fever.  HENT:  Negative for drooling, ear discharge, ear pain and sore throat.   Respiratory:  Negative for cough, shortness of breath and wheezing.   Cardiovascular:  Negative for chest pain, palpitations and leg swelling.  Gastrointestinal:  Negative for abdominal pain, blood in stool, constipation, diarrhea and nausea.  Endocrine: Negative for polydipsia.  Genitourinary:  Negative for dysuria, frequency, hematuria and urgency.  Musculoskeletal:  Negative for back pain, myalgias and neck pain.  Skin:  Negative for rash.  Allergic/Immunologic: Negative for environmental allergies.   Neurological:  Negative for dizziness and headaches.  Hematological:  Does not bruise/bleed easily.  Psychiatric/Behavioral:  Negative for suicidal ideas. The patient is not nervous/anxious.    Patient Active Problem List   Diagnosis Date Noted   Other synovitis and tenosynovitis, unspecified hand 03/13/2021   Inflammatory pain 03/13/2021   Ganglion cyst of finger of right hand 06/25/2020   Pain in finger of right hand 06/25/2020   Aortic atherosclerosis ([place]) 11/05/2018   COPD, moderate ([place]) 08/23/2015   Asthma, mild intermittent 06/07/2015   Hyperlipidemia 06/07/2015    No Known Allergies  Past Surgical History:  Procedure Laterality Date   ABDOMINAL HYSTERECTOMY     COLONOSCOPY     TONSILLECTOMY      Social History   Tobacco Use   Smoking status: Every Day    Packs/day: 0.25    Years: 50.00    Pack years: 12.50    Types: Cigarettes    Start date: 02/17/1958   Smokeless tobacco: Never   Tobacco comments:    aware needs to stop   Vaping Use   Vaping Use: Never used  Substance Use Topics   Alcohol use: Yes    Alcohol/week: 1.0 standard drink    Types: 1 Glasses of wine per week   Drug use: No     Medication list has been reviewed and updated.  Current Meds  Medication Sig  aspirin 81 MG tablet Take 81 mg by mouth daily.   budesonide (ENTOCORT EC) 3 MG 24 hr capsule TAKE ONE CAPSULE BY MOUTH EVERY MORNING (Patient taking differently: 9 mg daily. [place])   calcium carbonate 1250 MG capsule Take 1,250 mg by mouth 2 (two) times daily with a meal.   lovastatin (MEVACOR) 20 MG tablet Take 1 tablet (20 mg total) by mouth daily.   Magnesium 400 MG TABS    Melatonin 10 MG TABS Take 1 tablet by mouth at bedtime.   metroNIDAZOLE (FLAGYL) 500 MG tablet Take 1 tablet (500 mg total) by mouth 3 (three) times daily for 10 days.   montelukast (SINGULAIR) 10 MG tablet TAKE 1 TABLET(10 MG) BY MOUTH DAILY   Multiple Vitamins-Minerals (CENTRUM SILVER ADULT 50+ PO) Take 1  capsule by mouth daily at 6 (six) AM.    PHQ 2/9 Scores 02/26/2021 08/24/2020 06/12/2020 05/21/2020  PHQ - 2 Score 0 0 0 0  PHQ- 9 Score 0 0 0 -    GAD 7 : Generalized Anxiety Score 02/26/2021 08/24/2020 06/12/2020 09/16/2019  Nervous, Anxious, on Edge 0 0 0 0  Control/stop worrying 0 0 0 0  Worry too much - different things 0 0 0 0  Trouble relaxing 0 0 0 0  Restless 0 0 0 0  Easily annoyed or irritable 0 0 0 0  Afraid - awful might happen 0 0 0 0  Total GAD 7 Score 0 0 0 0  Anxiety Difficulty Not difficult at all - - -    BP Readings from Last 3 Encounters:  04/09/21 (!) 138/100  04/03/21 (!) 147/84  03/31/21 (!) 149/76    Physical Exam Vitals and nursing note reviewed. Exam conducted with a chaperone present.  Constitutional:      General: She is not in acute distress.    Appearance: She is not diaphoretic.  HENT:     Head: Normocephalic and atraumatic.     Right Ear: Tympanic membrane, ear canal and external ear normal.     Left Ear: Tympanic membrane, ear canal and external ear normal.     Nose: Nose normal.  Eyes:     General:        Right eye: No discharge.        Left eye: No discharge.     Conjunctiva/sclera: Conjunctivae normal.     Pupils: Pupils are equal, round, and reactive to light.  Neck:     Thyroid: No thyromegaly.     Vascular: No JVD.  Cardiovascular:     Rate and Rhythm: Normal rate and regular rhythm.     Heart sounds: Normal heart sounds, S1 normal and S2 normal. No murmur heard. No systolic murmur is present.  No diastolic murmur is present.    No friction rub. No gallop. No S3 or S4 sounds.  Pulmonary:     Effort: Pulmonary effort is normal.     Breath sounds: Normal breath sounds. No wheezing, rhonchi or rales.  Abdominal:     General: Bowel sounds are increased.     Palpations: Abdomen is soft. There is no mass.     Tenderness: There is no abdominal tenderness. There is no guarding.  Musculoskeletal:     Cervical back: Normal range of motion  and neck supple.     Right lower leg: 1+ Edema present.     Left lower leg: 1+ Edema present.  Lymphadenopathy:     Cervical: No cervical adenopathy.  Neurological:  Mental Status: She is alert.     Deep Tendon Reflexes: Reflexes are normal and symmetric.    Wt Readings from Last 3 Encounters:  04/09/21 156 lb (70.8 kg)  04/03/21 157 lb 8 oz (71.4 kg)  03/29/21 157 lb (71.2 kg)    BP (!) 138/100    Pulse 84    Ht _0  (1.702 m)    Wt 156 lb (70.8 kg)    BMI 24.43 kg/m   Assessment and Plan:  1. Diverticulitis Follow-up from hospitalization.  Patient currently is only on metronidazole and is on her last dosings.  Patient continues to improve with decreased abdominal discomfort however continues to have some diarrhea which is followed by gastroenterology at this time.  Exam is improved with decreased tenderness but bowel sounds are still hyperactive  2. Elevated blood pressure, situational Mild blood pressure elevation may be due to volume overload and that patient has 1+ edema.  This may be secondary to medication or increased sodium intake.  At this time we will continue to observe and that there is been a 3 pound weight gain.  If this continues we will recheck blood pressure at next visit and address accordingly.

## 2021-04-11 ENCOUNTER — Encounter: Payer: Self-pay | Admitting: Gastroenterology

## 2021-04-11 LAB — GI PROFILE, STOOL, PCR

## 2021-04-11 MED ORDER — BUDESONIDE 3 MG PO CPEP: 3.0000 mg | ORAL_CAPSULE | Freq: Every day | ORAL | 3 refills | Status: DC

## 2021-04-12 DIAGNOSIS — R197 Diarrhea, unspecified: Secondary | ICD-10-CM | POA: Diagnosis not present

## 2021-04-15 DIAGNOSIS — Z85828 Personal history of other malignant neoplasm of skin: Secondary | ICD-10-CM | POA: Diagnosis not present

## 2021-04-15 DIAGNOSIS — Z86018 Personal history of other benign neoplasm: Secondary | ICD-10-CM | POA: Diagnosis not present

## 2021-04-15 DIAGNOSIS — L570 Actinic keratosis: Secondary | ICD-10-CM | POA: Diagnosis not present

## 2021-04-15 DIAGNOSIS — Z872 Personal history of diseases of the skin and subcutaneous tissue: Secondary | ICD-10-CM | POA: Diagnosis not present

## 2021-04-15 DIAGNOSIS — L578 Other skin changes due to chronic exposure to nonionizing radiation: Secondary | ICD-10-CM | POA: Diagnosis not present

## 2021-04-16 LAB — PANCREATIC ELASTASE, FECAL: Pancreatic Elastase, Fecal: 405 ug Elast./g (ref >200)

## 2021-04-16 LAB — CALPROTECTIN, FECAL: Calprotectin, Fecal: <16 ug/g (ref 0–120)

## 2021-04-17 ENCOUNTER — Encounter: Payer: Self-pay | Admitting: Gastroenterology

## 2021-04-17 DIAGNOSIS — M19041 Primary osteoarthritis, right hand: Secondary | ICD-10-CM | POA: Diagnosis not present

## 2021-04-17 DIAGNOSIS — M67441 Ganglion, right hand: Secondary | ICD-10-CM | POA: Diagnosis not present

## 2021-05-13 DIAGNOSIS — H2513 Age-related nuclear cataract, bilateral: Secondary | ICD-10-CM | POA: Diagnosis not present

## 2021-05-14 ENCOUNTER — Other Ambulatory Visit: Payer: Self-pay

## 2021-05-14 DIAGNOSIS — E785 Hyperlipidemia, unspecified: Secondary | ICD-10-CM

## 2021-05-14 MED ORDER — LOVASTATIN 20 MG PO TABS: 20.0000 mg | ORAL_TABLET | Freq: Every day | ORAL | 0 refills | Status: DC

## 2021-05-22 ENCOUNTER — Ambulatory Visit (INDEPENDENT_AMBULATORY_CARE_PROVIDER_SITE_OTHER): Payer: PPO

## 2021-05-22 DIAGNOSIS — Z0000 Encounter for general adult medical examination without abnormal findings: Secondary | ICD-10-CM

## 2021-05-22 NOTE — Patient Instructions (Signed)
Ms. Tafolla , ?Thank you for taking time to come for your Medicare Wellness Visit. I appreciate your ongoing commitment to your health goals. Please review the following plan we discussed and let me know if I can assist you in the future.  ? ?Screening recommendations/referrals: ?Colonoscopy: done 05/14/18. Repeat 04/2023 ?Mammogram: done 2/3/223 ?Bone Density: done 06/26/20 ?Recommended yearly ophthalmology/optometry visit for glaucoma screening and checkup ?Recommended yearly dental visit for hygiene and checkup ? ?Vaccinations: ?Influenza vaccine: done 11/18/20 ?Pneumococcal vaccine: done 08/13/15 ?Tdap vaccine: due ?Shingles vaccine: done 12/02/17 & 02/02/18   ?Covid-19:done 03/18/19, 04/15/19, 12/19/19, 01/20/21 & 05/02/21 ? ?Advanced directives: Please bring a copy of your health care power of attorney and living will to the office at your convenience.  ? ?Conditions/risks identified: If you wish to quit smoking, help is available. For free tobacco cessation program offerings call the [facility] at [phone] or Live Well Line at [phone]. You may also visit [email or web address] or email [email or web address] for more information on other programs.  ? ?Next appointment: Follow up in one year for your annual wellness visit  ? ? ?Preventive Care 68 Years and Older, Female ?Preventive care refers to lifestyle choices and visits with your health care provider that can promote health and wellness. ?What does preventive care include? ?A yearly physical exam. This is also called an annual well check. ?Dental exams once or twice a year. ?Routine eye exams. Ask your health care provider how often you should have your eyes checked. ?Personal lifestyle choices, including: ?Daily care of your teeth and gums. ?Regular physical activity. ?Eating a healthy diet. ?Avoiding tobacco and drug use. ?Limiting alcohol use. ?Practicing safe sex. ?Taking low-dose aspirin every day. ?Taking vitamin and mineral  supplements as recommended by your health care provider. ?What happens during an annual well check? ?The services and screenings done by your health care provider during your annual well check will depend on your age, overall health, lifestyle risk factors, and family history of disease. ?Counseling  ?Your health care provider may ask you questions about your: ?Alcohol use. ?Tobacco use. ?Drug use. ?Emotional well-being. ?Home and relationship well-being. ?Sexual activity. ?Eating habits. ?History of falls. ?Memory and ability to understand (cognition). ?Work and work Statistician. ?Reproductive health. ?Screening  ?You may have the following tests or measurements: ?Height, weight, and BMI. ?Blood pressure. ?Lipid and cholesterol levels. These may be checked every 5 years, or more frequently if you are over 20 years old. ?Skin check. ?Lung cancer screening. You may have this screening every year starting at age 58 if you have a 30-pack-year history of smoking and currently smoke or have quit within the past 15 years. ?Fecal occult blood test (FOBT) of the stool. You may have this test every year starting at age 51. ?Flexible sigmoidoscopy or colonoscopy. You may have a sigmoidoscopy every 5 years or a colonoscopy every 10 years starting at age 73. ?Hepatitis C blood test. ?Hepatitis B blood test. ?Sexually transmitted disease (STD) testing. ?Diabetes screening. This is done by checking your blood sugar (glucose) after you have not eaten for a while (fasting). You may have this done every 1-3 years. ?Bone density scan. This is done to screen for osteoporosis. You may have this done starting at age 66. ?Mammogram. This may be done every 1-2 years. Talk to your health care provider about how often you should have regular mammograms. ?Talk with your health care provider about your test results, treatment options, and if necessary, the need for more  tests. ?Vaccines  ?Your health care provider may recommend certain  vaccines, such as: ?Influenza vaccine. This is recommended every year. ?Tetanus, diphtheria, and acellular pertussis (Tdap, Td) vaccine. You may need a Td booster every 10 years. ?Zoster vaccine. You may need this after age 34. ?Pneumococcal 13-valent conjugate (PCV13) vaccine. One dose is recommended after age 16. ?Pneumococcal polysaccharide (PPSV23) vaccine. One dose is recommended after age 51. ?Talk to your health care provider about which screenings and vaccines you need and how often you need them. ?This information is not intended to replace advice given to you by your health care provider. Make sure you discuss any questions you have with your health care provider. ?Document Released: 03/02/2015 Document Revised: 10/24/2015 Document Reviewed: 12/05/2014 ?Elsevier Interactive Patient Education ? 2017 [employer]. ? ?Fall Prevention in the Home ?Falls can cause injuries. They can happen to people of all ages. There are many things you can do to make your home safe and to help prevent falls. ?What can I do on the outside of my home? ?Regularly fix the edges of walkways and driveways and fix any cracks. ?Remove anything that might make you trip as you walk through a door, such as a raised step or threshold. ?Trim any bushes or trees on the path to your home. ?Use bright outdoor lighting. ?Clear any walking paths of anything that might make someone trip, such as rocks or tools. ?Regularly check to see if handrails are loose or broken. Make sure that both sides of any steps have handrails. ?Any raised decks and porches should have guardrails on the edges. ?Have any leaves, snow, or ice cleared regularly. ?Use sand or salt on walking paths during winter. ?Clean up any spills in your garage right away. This includes oil or grease spills. ?What can I do in the bathroom? ?Use night lights. ?Install grab bars by the toilet and in the tub and shower. Do not use towel bars as grab bars. ?Use non-skid mats or decals in  the tub or shower. ?If you need to sit down in the shower, use a plastic, non-slip stool. ?Keep the floor dry. Clean up any water that spills on the floor as soon as it happens. ?Remove soap buildup in the tub or shower regularly. ?Attach bath mats securely with double-sided non-slip rug tape. ?Do not have throw rugs and other things on the floor that can make you trip. ?What can I do in the bedroom? ?Use night lights. ?Make sure that you have a light by your bed that is easy to reach. ?Do not use any sheets or blankets that are too big for your bed. They should not hang down onto the floor. ?Have a firm chair that has side arms. You can use this for support while you get dressed. ?Do not have throw rugs and other things on the floor that can make you trip. ?What can I do in the kitchen? ?Clean up any spills right away. ?Avoid walking on wet floors. ?Keep items that you use a lot in easy-to-reach places. ?If you need to reach something above you, use a strong step stool that has a grab bar. ?Keep electrical cords out of the way. ?Do not use floor polish or wax that makes floors slippery. If you must use wax, use non-skid floor wax. ?Do not have throw rugs and other things on the floor that can make you trip. ?What can I do with my stairs? ?Do not leave any items on the stairs. ?Make sure  that there are handrails on both sides of the stairs and use them. Fix handrails that are broken or loose. Make sure that handrails are as long as the stairways. ?Check any carpeting to make sure that it is firmly attached to the stairs. Fix any carpet that is loose or worn. ?Avoid having throw rugs at the top or bottom of the stairs. If you do have throw rugs, attach them to the floor with carpet tape. ?Make sure that you have a light switch at the top of the stairs and the bottom of the stairs. If you do not have them, ask someone to add them for you. ?What else can I do to help prevent falls? ?Wear shoes that: ?Do not have high  heels. ?Have rubber bottoms. ?Are comfortable and fit you well. ?Are closed at the toe. Do not wear sandals. ?If you use a stepladder: ?Make sure that it is fully opened. Do not climb a closed stepladder. ?Make su

## 2021-05-22 NOTE — Progress Notes (Signed)
? ?Subjective:  ? Caroline Harris is a 76 y.o. female who presents for Medicare Annual (Subsequent) preventive examination. ? ?Virtual Visit via Telephone Note ? ?I connected with  Caroline Harris on 05/22/21 at 10:30 AM EDT by telephone and verified that I am speaking with the correct person using two identifiers. ? ?Location: ?Patient: home ?Provider: [facility] ?Persons participating in the virtual visit: patient/Nurse Health Advisor ?  ?I discussed the limitations, risks, security and privacy concerns of performing an evaluation and management service by telephone and the availability of in person appointments. The patient expressed understanding and agreed to proceed. ? ?Interactive audio and video telecommunications were attempted between this nurse and patient, however failed, due to patient having technical difficulties OR patient did not have access to video capability.  We continued and completed visit with audio only. ? ?Some vital signs may be absent or patient reported.  ? ?Clemetine Marker, LPN ? ? ?Review of Systems    ? ?Cardiac Risk Factors include: advanced age (>58mn, >>53women);dyslipidemia ? ?   ?Objective:  ?  ?There were no vitals filed for this visit. ?There is no height or weight on file to calculate BMI. ? ? ?  05/22/2021  ? 10:38 AM 03/29/2021  ?  1:20 PM 05/21/2020  ? 11:38 AM 05/11/2019  ?  3:34 PM 04/05/2018  ? 11:49 AM 01/22/2018  ? 10:47 AM 08/10/2014  ?  2:26 PM  ?Advanced Directives  ?Does Patient Have a Medical Advance Directive? No No No No No No No  ?Would patient like information on creating a medical advance directive? No - Patient declined No - Patient declined Yes (MAU/Ambulatory/Procedural Areas - Information given) No - Patient declined Yes (MAU/Ambulatory/Procedural Areas - Information given)  No - patient declined information  ? ? ?Current Medications (verified) ?Outpatient Encounter Medications as of 05/22/2021  ?Medication Sig  ? aspirin 81 MG tablet Take 81 mg by mouth daily.  ? budesonide  (ENTOCORT EC) 3 MG 24 hr capsule Take 1 capsule (3 mg total) by mouth daily.  ? calcium carbonate 1250 MG capsule Take 1,250 mg by mouth 2 (two) times daily with a meal.  ? lovastatin (MEVACOR) 20 MG tablet Take 1 tablet (20 mg total) by mouth daily.  ? Magnesium 400 MG TABS   ? Melatonin 10 MG TABS Take 1 tablet by mouth at bedtime.  ? montelukast (SINGULAIR) 10 MG tablet TAKE 1 TABLET(10 MG) BY MOUTH DAILY  ? Multiple Vitamins-Minerals (CENTRUM SILVER ADULT 50+ PO) Take 1 capsule by mouth daily at 6 (six) AM.  ? [DISCONTINUED] budesonide (ENTOCORT EC) 3 MG 24 hr capsule TAKE ONE CAPSULE BY MOUTH EVERY MORNING (Patient taking differently: 9 mg daily. [place])  ? ?No facility-administered encounter medications on file as of 05/22/2021.  ? ? ?Allergies (verified) ?Patient has no known allergies.  ? ?History: ?Past Medical History:  ?Diagnosis Date  ? Arthritis   ? In fingers  ? Cataract March, 2023  ? Will have removed  ? Colitis   ? Collagenous colitis   ? Diverticulitis   ? Diverticulitis 03/29/2021  ? Hyperlipidemia   ? Osteopenia   ? Restless leg   ? ?Past Surgical History:  ?Procedure Laterality Date  ? ABDOMINAL HYSTERECTOMY    ? COLONOSCOPY    ? TONSILLECTOMY    ? TUBAL LIGATION    ? ?Family History  ?Problem Relation Age of Onset  ? Breast cancer Mother 575 ? Cancer Mother   ? Miscarriages / [place]  Mother   ? Heart attack Father   ? Heart disease Father   ? ?Social History  ? ?Socioeconomic History  ? Marital status: Married  ?  Spouse name: Not on file  ? Number of children: 3  ? Years of education: Not on file  ? Highest education level: Bachelor's degree (e.g., BA, AB, BS)  ?Occupational History  ?  Comment: part time music teacher at [employer]  ?Tobacco Use  ? Smoking status: Every Day  ?  Packs/day: 0.25  ?  Years: 50.00  ?  Pack years: 12.50  ?  Types: Cigarettes  ?  Start date: 02/17/1958  ? Smokeless tobacco: Never  ? Tobacco comments:  ?  aware needs to stop   ?Vaping Use  ? Vaping Use:  Never used  ?Substance and Sexual Activity  ? Alcohol use: Yes  ?  Alcohol/week: 1.0 standard drink  ?  Types: 1 Glasses of wine per week  ? Drug use: No  ? Sexual activity: Not Currently  ?  Birth control/protection: Abstinence, Surgical  ?  Comment: Hysterectomy  ?Other Topics Concern  ? Not on file  ?Social History Narrative  ? Not on file  ? ?Social Determinants of Health  ? ?Financial Resource Strain: Low Risk   ? Difficulty of Paying Living Expenses: Not hard at all  ?Food Insecurity: No Food Insecurity  ? Worried About Charity fundraiser in the Last Year: Never true  ? Ran Out of Food in the Last Year: Never true  ?Transportation Needs: No Transportation Needs  ? Lack of Transportation (Medical): No  ? Lack of Transportation (Non-Medical): No  ?Physical Activity: Insufficiently Active  ? Days of Exercise per Week: 2 days  ? Minutes of Exercise per Session: 30 min  ?Stress: No Stress Concern Present  ? Feeling of Stress : Not at all  ?Social Connections: Moderately Integrated  ? Frequency of Communication with Friends and Family: More than three times a week  ? Frequency of Social Gatherings with Friends and Family: Three times a week  ? Attends Religious Services: More than 4 times per year  ? Active Member of Clubs or Organizations: No  ? Attends Archivist Meetings: Never  ? Marital Status: Married  ? ? ?Tobacco Counseling ?Ready to quit: Yes ?Counseling given: Yes ?Tobacco comments: aware needs to stop  ? ? ?Clinical Intake: ? ?Pre-visit preparation completed: Yes ? ?Pain : No/denies pain ? ?  ? ?Nutritional Risks: None ?Diabetes: No ? ?How often do you need to have someone help you when you read instructions, pamphlets, or other written materials from your doctor or pharmacy?: 1 - Never ? ? ? ?Interpreter Needed?: No ? ?Information entered by :: Clemetine Marker LPN ? ? ?Activities of Daily Living ? ?  05/22/2021  ? 10:42 AM 03/29/2021  ?  7:50 PM  ?In your present state of health, do you have any  difficulty performing the following activities:  ?Hearing? 0 0  ?Vision? 0 0  ?Difficulty concentrating or making decisions? 0 0  ?Walking or climbing stairs? 0 0  ?Dressing or bathing? 0 0  ?Doing errands, shopping? 0   ?Preparing Food and eating ? N   ?Using the Toilet? N   ?In the past six months, have you accidently leaked urine? N   ?Do you have problems with loss of bowel control? N   ?Managing your Medications? N   ?Managing your Finances? N   ?Housekeeping or managing your  Housekeeping? N   ? ? ?Patient Care Team: ?Juline Patch, MD as PCP - General (Family Medicine) ?Lin Landsman, MD as Consulting Physician (Gastroenterology) ?Roseanne Kaufman, MD as Consulting Physician (Orthopedic Surgery) ? ?Indicate any recent Medical Services you may have received from other than [facility] providers in the past year (date may be approximate). ? ?   ?Assessment:  ? This is a routine wellness examination for Caroline Harris. ? ?Hearing/Vision screen ?Hearing Screening - Comments:: Pt has no difficulty hearing ?Vision Screening - Comments:: Annual vision screenings with Dr. Annamaria Helling [facility] ? ?Dietary issues and exercise activities discussed: ?Current Exercise Habits: Home exercise routine, Type of exercise: walking, Time (Minutes): 30, Frequency (Times/Week): 2, Weekly Exercise (Minutes/Week): 60, Intensity: Mild, Exercise limited by: None identified ? ? Goals Addressed   ? ?  ?  ?  ?  ? This Visit's Progress  ?  Increase physical activity     ?  Recommend increasing physical activity to at least 3 days per week ?  ?  Quit Smoking   Not on track  ?  Pt states she would like to quit smoking over the next year.  ?  ? ?  ? ?Depression Screen ? ?  05/22/2021  ? 10:37 AM 02/26/2021  ?  9:20 AM 08/24/2020  ?  8:13 AM 06/12/2020  ? 10:53 AM 05/21/2020  ? 11:37 AM 02/23/2020  ?  8:09 AM 09/16/2019  ?  4:09 PM  ?PHQ 2/9 Scores  ?PHQ - 2 Score 0 0 0 0 0 0 0  ?PHQ- 9 Score  0 0 0  0 0  ?  ?Fall Risk ? ?  05/22/2021  ? 10:38 AM 05/21/2020  ?  11:39 AM 02/23/2020  ?  8:09 AM 09/16/2019  ?  4:09 PM 07/04/2019  ?  1:35 PM  ?Fall Risk   ?Falls in the past year? 0 0 0 0 0  ?Number falls in past yr: 0 0     ?Comment  tripped in yard once no injuries     ?

## 2021-05-31 ENCOUNTER — Other Ambulatory Visit: Payer: Self-pay

## 2021-05-31 ENCOUNTER — Encounter: Payer: Self-pay | Admitting: Family Medicine

## 2021-05-31 ENCOUNTER — Encounter: Payer: Self-pay | Admitting: Gastroenterology

## 2021-05-31 DIAGNOSIS — E785 Hyperlipidemia, unspecified: Secondary | ICD-10-CM

## 2021-05-31 MED ORDER — LOVASTATIN 20 MG PO TABS: 20.0000 mg | ORAL_TABLET | Freq: Every day | ORAL | 0 refills | Status: DC

## 2021-06-05 ENCOUNTER — Other Ambulatory Visit: Payer: Self-pay | Admitting: Gastroenterology

## 2021-06-05 DIAGNOSIS — K52831 Collagenous colitis: Secondary | ICD-10-CM

## 2021-06-05 MED ORDER — BUDESONIDE 3 MG PO CPEP: 9.0000 mg | ORAL_CAPSULE | Freq: Every day | ORAL | 3 refills | Status: AC

## 2021-07-18 DIAGNOSIS — M79644 Pain in right finger(s): Secondary | ICD-10-CM | POA: Diagnosis not present

## 2021-07-18 DIAGNOSIS — M19049 Primary osteoarthritis, unspecified hand: Secondary | ICD-10-CM | POA: Diagnosis not present

## 2021-07-18 DIAGNOSIS — M67441 Ganglion, right hand: Secondary | ICD-10-CM | POA: Diagnosis not present

## 2021-08-08 DIAGNOSIS — H43813 Vitreous degeneration, bilateral: Secondary | ICD-10-CM | POA: Diagnosis not present

## 2021-08-08 DIAGNOSIS — H40003 Preglaucoma, unspecified, bilateral: Secondary | ICD-10-CM | POA: Diagnosis not present

## 2021-08-08 DIAGNOSIS — H2513 Age-related nuclear cataract, bilateral: Secondary | ICD-10-CM | POA: Diagnosis not present

## 2021-08-09 DIAGNOSIS — M19041 Primary osteoarthritis, right hand: Secondary | ICD-10-CM | POA: Diagnosis not present

## 2021-08-12 ENCOUNTER — Encounter: Payer: Self-pay | Admitting: Gastroenterology

## 2021-08-13 ENCOUNTER — Other Ambulatory Visit: Payer: Self-pay | Admitting: Family Medicine

## 2021-08-13 DIAGNOSIS — J4520 Mild intermittent asthma, uncomplicated: Secondary | ICD-10-CM

## 2021-08-26 DIAGNOSIS — Z4789 Encounter for other orthopedic aftercare: Secondary | ICD-10-CM | POA: Diagnosis not present

## 2021-09-05 DIAGNOSIS — M25641 Stiffness of right hand, not elsewhere classified: Secondary | ICD-10-CM | POA: Diagnosis not present

## 2021-09-11 DIAGNOSIS — M25641 Stiffness of right hand, not elsewhere classified: Secondary | ICD-10-CM | POA: Diagnosis not present

## 2021-09-18 DIAGNOSIS — M25641 Stiffness of right hand, not elsewhere classified: Secondary | ICD-10-CM | POA: Diagnosis not present

## 2021-09-24 ENCOUNTER — Encounter: Payer: Self-pay | Admitting: Family Medicine

## 2021-09-24 DIAGNOSIS — H2512 Age-related nuclear cataract, left eye: Secondary | ICD-10-CM | POA: Diagnosis not present

## 2021-09-25 ENCOUNTER — Encounter: Payer: Self-pay | Admitting: Family Medicine

## 2021-09-25 ENCOUNTER — Ambulatory Visit (INDEPENDENT_AMBULATORY_CARE_PROVIDER_SITE_OTHER): Payer: PPO | Admitting: Family Medicine

## 2021-09-25 VITALS — BP 120/70 | HR 76 | Ht 67.0 in | Wt 154.0 lb

## 2021-09-25 DIAGNOSIS — R030 Elevated blood-pressure reading, without diagnosis of hypertension: Secondary | ICD-10-CM

## 2021-09-25 DIAGNOSIS — J4520 Mild intermittent asthma, uncomplicated: Secondary | ICD-10-CM

## 2021-09-25 DIAGNOSIS — R710 Precipitous drop in hematocrit: Secondary | ICD-10-CM | POA: Diagnosis not present

## 2021-09-25 DIAGNOSIS — E785 Hyperlipidemia, unspecified: Secondary | ICD-10-CM

## 2021-09-25 MED ORDER — LOVASTATIN 20 MG PO TABS: 20.0000 mg | ORAL_TABLET | Freq: Every day | ORAL | 0 refills | Status: DC

## 2021-09-25 MED ORDER — MONTELUKAST SODIUM 10 MG PO TABS: ORAL_TABLET | ORAL | 0 refills | Status: DC

## 2021-09-25 NOTE — Progress Notes (Signed)
Date:  09/25/2021   Name:  Caroline Harris   DOB:  1945/03/05   MRN:  [id number]   Chief Complaint: Hyperlipidemia and Allergic Rhinitis   Hyperlipidemia This is a chronic problem. The current episode started more than 1 year ago. The problem is controlled. Recent lipid tests were reviewed and are normal. She has no history of chronic renal disease, diabetes, hypothyroidism, liver disease, obesity or nephrotic syndrome. There are no known factors aggravating her hyperlipidemia. Pertinent negatives include no chest pain, focal sensory loss, focal weakness, leg pain, myalgias or shortness of breath. Current antihyperlipidemic treatment includes statins. The current treatment provides moderate improvement of lipids. There are no compliance problems.  Risk factors for coronary artery disease include dyslipidemia.    Lab Results  Component Value Date   NA 137 03/31/2021   K 3.4 (L) 03/31/2021   CO2 26 03/31/2021   GLUCOSE 102 (H) 03/31/2021   BUN 13 03/31/2021   CREATININE 0.55 03/31/2021   CALCIUM 8.4 (L) 03/31/2021   EGFR 84 08/24/2020   GFRNONAA >60 03/31/2021   Lab Results  Component Value Date   CHOL 178 02/26/2021   HDL 83 02/26/2021   LDLCALC 80 02/26/2021   TRIG 81 02/26/2021   CHOLHDL 1.7 11/05/2018   Lab Results  Component Value Date   TSH 1.120 06/01/2017   No results found for: "HGBA1C" Lab Results  Component Value Date   WBC 5.3 03/31/2021   HGB 11.2 (L) 03/31/2021   HCT 33.8 (L) 03/31/2021   MCV 93.4 03/31/2021   PLT 231 03/31/2021   Lab Results  Component Value Date   ALT 12 03/30/2021   AST 15 03/30/2021   ALKPHOS 33 (L) 03/30/2021   BILITOT 0.9 03/30/2021   No results found for: "25OHVITD2", "25OHVITD3", "VD25OH"   Review of Systems  Respiratory:  Negative for cough, shortness of breath and wheezing.   Cardiovascular:  Negative for chest pain, palpitations and leg swelling.  Gastrointestinal:  Negative for blood in stool.  Endocrine: Negative for  heat intolerance, polydipsia and polyuria.  Genitourinary:  Negative for difficulty urinating and vaginal bleeding.  Musculoskeletal:  Negative for arthralgias, back pain and myalgias.  Neurological:  Negative for focal weakness.    Patient Active Problem List   Diagnosis Date Noted   Other synovitis and tenosynovitis, unspecified hand 03/13/2021   Inflammatory pain 03/13/2021   Ganglion cyst of finger of right hand 06/25/2020   Pain in finger of right hand 06/25/2020   Aortic atherosclerosis ([place]) 11/05/2018   COPD, moderate ([place]) 08/23/2015   Asthma, mild intermittent 06/07/2015   Hyperlipidemia 06/07/2015    No Known Allergies  Past Surgical History:  Procedure Laterality Date   ABDOMINAL HYSTERECTOMY     COLONOSCOPY     TONSILLECTOMY     TUBAL LIGATION      Social History   Tobacco Use   Smoking status: Every Day    Packs/day: 0.25    Years: 50.00    Total pack years: 12.50    Types: Cigarettes    Start date: 02/17/1958   Smokeless tobacco: Never   Tobacco comments:    aware needs to stop   Vaping Use   Vaping Use: Never used  Substance Use Topics   Alcohol use: Yes    Alcohol/week: 1.0 standard drink of alcohol    Types: 1 Glasses of wine per week   Drug use: No     Medication list has been reviewed and updated.  Current  Meds  Medication Sig   aspirin 81 MG tablet Take 81 mg by mouth daily.   budesonide (ENTOCORT EC) 3 MG 24 hr capsule Take 1 capsule by mouth every morning.   calcium carbonate 1250 MG capsule Take 1,250 mg by mouth 2 (two) times daily with a meal.   lovastatin (MEVACOR) 20 MG tablet Take 1 tablet (20 mg total) by mouth daily.   Magnesium 400 MG TABS    Melatonin 10 MG TABS Take 1 tablet by mouth at bedtime.   montelukast (SINGULAIR) 10 MG tablet TAKE (1) TABLET BY MOUTH EVERY DAY   Multiple Vitamins-Minerals (CENTRUM SILVER ADULT 50+ PO) Take 1 capsule by mouth daily at 6 (six) AM.       09/25/2021    9:21 AM 02/26/2021    9:20 AM  08/24/2020    8:13 AM 06/12/2020   10:53 AM  GAD 7 : Generalized Anxiety Score  Nervous, Anxious, on Edge 0 0 0 0  Control/stop worrying 0 0 0 0  Worry too much - different things 0 0 0 0  Trouble relaxing 0 0 0 0  Restless 0 0 0 0  Easily annoyed or irritable 0 0 0 0  Afraid - awful might happen 0 0 0 0  Total GAD 7 Score 0 0 0 0  Anxiety Difficulty Not difficult at all Not difficult at all         09/25/2021    9:21 AM 05/22/2021   10:37 AM 02/26/2021    9:20 AM  Depression screen PHQ 2/9  Decreased Interest 0 0 0  Down, Depressed, Hopeless 0 0 0  PHQ - 2 Score 0 0 0  Altered sleeping 0  0  Tired, decreased energy 0  0  Change in appetite 0  0  Feeling bad or failure about yourself  0  0  Trouble concentrating 0  0  Moving slowly or fidgety/restless 0  0  Suicidal thoughts 0  0  PHQ-9 Score 0  0  Difficult doing work/chores Not difficult at all  Not difficult at all    BP Readings from Last 3 Encounters:  09/25/21 120/70  04/09/21 (!) 138/100  04/03/21 (!) 147/84    Physical Exam Vitals and nursing note reviewed.  HENT:     Right Ear: Tympanic membrane, ear canal and external ear normal. There is no impacted cerumen.     Left Ear: Tympanic membrane, ear canal and external ear normal. There is no impacted cerumen.     Nose: Nose normal. No congestion or rhinorrhea.     Mouth/Throat:     Pharynx: No oropharyngeal exudate.  Eyes:     General:        Right eye: No discharge.        Left eye: No discharge.     Pupils: Pupils are equal, round, and reactive to light.  Cardiovascular:     Rate and Rhythm: Normal rate.     Heart sounds: No murmur heard.    No friction rub. No gallop.  Pulmonary:     Breath sounds: No wheezing or rhonchi.  Abdominal:     Tenderness: There is no right CVA tenderness or left CVA tenderness.     Wt Readings from Last 3 Encounters:  09/25/21 154 lb (69.9 kg)  04/09/21 156 lb (70.8 kg)  04/03/21 157 lb 8 oz (71.4 kg)    BP 120/70    Pulse 76   Ht 5' 7" (1.702 m)  Wt 154 lb (69.9 kg)   BMI 24.12 kg/m   Assessment and Plan:  1. Hyperlipidemia, unspecified hyperlipidemia type Chronic.  Controlled.  Stable.  Continue lovastatin 20 mg today.  Will check lipid panel for current status LDL. - lovastatin (MEVACOR) 20 MG tablet; Take 1 tablet (20 mg total) by mouth daily.  Dispense: 90 tablet; Refill: 0 - Lipid Panel With LDL/HDL Ratio  2. Mild intermittent asthma, unspecified whether complicated Chronic.  Episodic. Intermittent.  Continue Singulair 10 mg once a day as needed. - montelukast (SINGULAIR) 10 MG tablet; TAKE (1) TABLET BY MOUTH EVERY DAY  Dispense: 90 tablet; Refill: 0  3. Elevated blood pressure, situational Chronic.  Controlled.  Stable.  Controlled with dietary control and avoidance of excessive sodium.  Will check renal function panel for electrolytes and GFR. - Renal Function Panel  4. Decreased hemoglobin Last admission a day mild decrease in hemoglobin likely secondary to minimal GI bleed associated with colitis.  Will check CBC for current status of hemoglobin - CBC w/Diff/Platelet    Otilio Miu, MD

## 2021-09-26 DIAGNOSIS — M25641 Stiffness of right hand, not elsewhere classified: Secondary | ICD-10-CM | POA: Diagnosis not present

## 2021-09-26 LAB — RENAL FUNCTION PANEL
Albumin: 4.6 g/dL (ref 3.8–4.8)
BUN/Creatinine Ratio: 19 (ref 12–28)
BUN: 13 mg/dL (ref 8–27)
CO2: 24 mmol/L (ref 20–29)
Calcium: 9.4 mg/dL (ref 8.7–10.3)
Chloride: 99 mmol/L (ref 96–106)
Creatinine, Ser: 0.68 mg/dL (ref 0.57–1.00)
Glucose: 88 mg/dL (ref 70–99)
Phosphorus: 4.2 mg/dL (ref 3.0–4.3)
Potassium: 4.5 mmol/L (ref 3.5–5.2)
Sodium: 139 mmol/L (ref 134–144)
eGFR: 90 mL/min/{1.73_m2} (ref >59)

## 2021-09-26 LAB — CBC WITH DIFFERENTIAL/PLATELET
Basophils Absolute: 0.1 10*3/uL (ref 0.0–0.2)
Basos: 1 %
EOS (ABSOLUTE): 0.1 10*3/uL (ref 0.0–0.4)
Eos: 1 %
Hematocrit: 39.1 % (ref 34.0–46.6)
Hemoglobin: 12.9 g/dL (ref 11.1–15.9)
Immature Grans (Abs): 0 10*3/uL (ref 0.0–0.1)
Immature Granulocytes: 0 %
Lymphocytes Absolute: 1.9 10*3/uL (ref 0.7–3.1)
Lymphs: 24 %
MCH: 31.4 pg (ref 26.6–33.0)
MCHC: 33 g/dL (ref 31.5–35.7)
MCV: 95 fL (ref 79–97)
Monocytes Absolute: 0.5 10*3/uL (ref 0.1–0.9)
Monocytes: 6 %
Neutrophils Absolute: 5.4 10*3/uL (ref 1.4–7.0)
Neutrophils: 68 %
Platelets: 289 10*3/uL (ref 150–450)
RBC: 4.11 x10E6/uL (ref 3.77–5.28)
RDW: 12.6 % (ref 11.7–15.4)
WBC: 8 10*3/uL (ref 3.4–10.8)

## 2021-09-26 LAB — LIPID PANEL WITH LDL/HDL RATIO
Cholesterol, Total: 176 mg/dL (ref 100–199)
HDL: 84 mg/dL (ref >39)
LDL Chol Calc (NIH): 77 mg/dL (ref 0–99)
LDL/HDL Ratio: 0.9 ratio (ref 0.0–3.2)
Triglycerides: 85 mg/dL (ref 0–149)
VLDL Cholesterol Cal: 15 mg/dL (ref 5–40)

## 2021-10-07 ENCOUNTER — Encounter: Payer: Self-pay | Admitting: Ophthalmology

## 2021-10-07 DIAGNOSIS — M25641 Stiffness of right hand, not elsewhere classified: Secondary | ICD-10-CM | POA: Diagnosis not present

## 2021-10-07 NOTE — Discharge Instructions (Signed)

## 2021-10-09 ENCOUNTER — Encounter: Payer: Self-pay | Admitting: Ophthalmology

## 2021-10-09 ENCOUNTER — Encounter
Admission: RE | Disposition: A | Discharge status: Home / Self Care | Payer: Self-pay | Source: Home / Self Care | Attending: Ophthalmology

## 2021-10-09 ENCOUNTER — Ambulatory Visit
Admission: RE | Admit: 2021-10-09 | Discharge: 2021-10-09 | Disposition: A | Discharge status: Home / Self Care | Payer: PPO | Attending: Ophthalmology | Admitting: Ophthalmology

## 2021-10-09 ENCOUNTER — Ambulatory Visit: Payer: PPO | Admitting: Anesthesiology

## 2021-10-09 ENCOUNTER — Ambulatory Visit (AMBULATORY_SURGERY_CENTER): Payer: PPO | Admitting: Anesthesiology

## 2021-10-09 ENCOUNTER — Other Ambulatory Visit: Payer: Self-pay

## 2021-10-09 DIAGNOSIS — Z7952 Long term (current) use of systemic steroids: Secondary | ICD-10-CM | POA: Insufficient documentation

## 2021-10-09 DIAGNOSIS — E785 Hyperlipidemia, unspecified: Secondary | ICD-10-CM | POA: Diagnosis not present

## 2021-10-09 DIAGNOSIS — F17210 Nicotine dependence, cigarettes, uncomplicated: Secondary | ICD-10-CM | POA: Insufficient documentation

## 2021-10-09 DIAGNOSIS — J449 Chronic obstructive pulmonary disease, unspecified: Secondary | ICD-10-CM | POA: Insufficient documentation

## 2021-10-09 DIAGNOSIS — H2512 Age-related nuclear cataract, left eye: Secondary | ICD-10-CM

## 2021-10-09 DIAGNOSIS — M8580 Other specified disorders of bone density and structure, unspecified site: Secondary | ICD-10-CM | POA: Insufficient documentation

## 2021-10-09 HISTORY — DX: Simple febrile convulsions: R56.00

## 2021-10-09 HISTORY — DX: Presence of dental prosthetic device (complete) (partial): Z97.2

## 2021-10-09 HISTORY — PX: CATARACT EXTRACTION W/PHACO: SHX586

## 2021-10-09 SURGERY — PHACOEMULSIFICATION, CATARACT, WITH IOL INSERTION
Anesthesia: Monitor Anesthesia Care | Site: Eye | Laterality: Left

## 2021-10-09 MED ORDER — SIGHTPATH DOSE#1 BSS IO SOLN
INTRAOCULAR | Status: DC | PRN
  Administered 2021-10-09: 63 mL via OPHTHALMIC

## 2021-10-09 MED ORDER — FENTANYL CITRATE (PF) 100 MCG/2ML IJ SOLN
INTRAMUSCULAR | Status: DC | PRN
  Administered 2021-10-09: 50 ug via INTRAVENOUS

## 2021-10-09 MED ORDER — TETRACAINE HCL 0.5 % OP SOLN
1.0000 [drp] | OPHTHALMIC | Status: DC | PRN
  Administered 2021-10-09 (×3): 1 [drp] via OPHTHALMIC

## 2021-10-09 MED ORDER — CEFUROXIME OPHTHALMIC INJECTION 1 MG/0.1 ML
INJECTION | OPHTHALMIC | Status: DC | PRN
  Administered 2021-10-09: 0.1 mL via INTRACAMERAL

## 2021-10-09 MED ORDER — ARMC OPHTHALMIC DILATING DROPS
1.0000 | OPHTHALMIC | Status: DC | PRN
  Administered 2021-10-09 (×3): 1 via OPHTHALMIC

## 2021-10-09 MED ORDER — SIGHTPATH DOSE#1 BSS IO SOLN
INTRAOCULAR | Status: DC | PRN
  Administered 2021-10-09: 1 mL via INTRAMUSCULAR

## 2021-10-09 MED ORDER — MIDAZOLAM HCL 2 MG/2ML IJ SOLN
INTRAMUSCULAR | Status: DC | PRN
  Administered 2021-10-09: 1 mg via INTRAVENOUS

## 2021-10-09 MED ORDER — LACTATED RINGERS IV SOLN: INTRAVENOUS | Status: DC

## 2021-10-09 MED ORDER — SIGHTPATH DOSE#1 NA HYALUR & NA CHOND-NA HYALUR IO KIT
PACK | INTRAOCULAR | Status: DC | PRN
  Administered 2021-10-09: 1 via OPHTHALMIC

## 2021-10-09 MED ORDER — SIGHTPATH DOSE#1 BSS IO SOLN
INTRAOCULAR | Status: DC | PRN
  Administered 2021-10-09: 15 mL

## 2021-10-09 MED ORDER — BRIMONIDINE TARTRATE-TIMOLOL 0.2-0.5 % OP SOLN
OPHTHALMIC | Status: DC | PRN
  Administered 2021-10-09: 1 [drp] via OPHTHALMIC

## 2021-10-09 SURGICAL SUPPLY — 11 items
CATARACT SUITE SIGHTPATH (MISCELLANEOUS) ×1 IMPLANT
FEE CATARACT SUITE SIGHTPATH (MISCELLANEOUS) ×1 IMPLANT
GLOVE SRG 8 PF TXTR STRL LF DI (GLOVE) ×1 IMPLANT
GLOVE SURG ENC TEXT LTX SZ7.5 (GLOVE) ×1 IMPLANT
GLOVE SURG UNDER POLY LF SZ8 (GLOVE) ×1
LENS IOL TECNIS EYHANCE 21.0 (Intraocular Lens) IMPLANT
NDL FILTER BLUNT 18X1 1/2 (NEEDLE) ×1 IMPLANT
NEEDLE FILTER BLUNT 18X 1/2SAF (NEEDLE) ×1
NEEDLE FILTER BLUNT 18X1 1/2 (NEEDLE) ×1 IMPLANT
SYR 3ML LL SCALE MARK (SYRINGE) ×1 IMPLANT
WATER STERILE IRR 250ML POUR (IV SOLUTION) ×1 IMPLANT

## 2021-10-09 NOTE — H&P (Signed)
[name]   Primary Care Physician:  Juline Patch, MD Ophthalmologist: Dr. Leandrew Koyanagi  Pre-Procedure History & Physical: HPI:  Caroline Harris is a 76 y.o. female here for ophthalmic surgery.   Past Medical History:  Diagnosis Date   Arthritis    In fingers   Cataract March, 2023   Will have removed   Colitis    Collagenous colitis    Diverticulitis    Diverticulitis 03/29/2021   Febrile seizure ([place])    as infant   Hyperlipidemia    Osteopenia    Restless leg    Wears dentures    partial lower    Past Surgical History:  Procedure Laterality Date   ABDOMINAL HYSTERECTOMY     COLONOSCOPY     TONSILLECTOMY     TUBAL LIGATION      Prior to Admission medications   Medication Sig Start Date End Date Taking? Authorizing Provider  acetaminophen (TYLENOL) 325 MG tablet Take 650 mg by mouth every 6 (six) hours as needed.   Yes [provider]  aspirin 81 MG tablet Take 81 mg by mouth daily.   Yes [provider]  budesonide (ENTOCORT EC) 3 MG 24 hr capsule Take 1 capsule by mouth every morning.   Yes [provider]  calcium carbonate 1250 MG capsule Take 1,250 mg by mouth 2 (two) times daily with a meal.   Yes [provider]  lovastatin (MEVACOR) 20 MG tablet Take 1 tablet (20 mg total) by mouth daily. 09/25/21  Yes Juline Patch, MD  Magnesium 400 MG TABS  01/01/21  Yes [provider]  Melatonin 10 MG TABS Take 1 tablet by mouth at bedtime.   Yes [provider]  montelukast (SINGULAIR) 10 MG tablet TAKE (1) TABLET BY MOUTH EVERY DAY 09/25/21  Yes Juline Patch, MD  Multiple Vitamins-Minerals (CENTRUM SILVER ADULT 50+ PO) Take 1 capsule by mouth daily at 6 (six) AM.   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[employer]  Tobacco Use   Smoking status: Every Day    Packs/day: 0.25    Years: 50.00    Total pack years: 12.50    Types: Cigarettes    Start date: 02/17/1958   Smokeless tobacco: Never   Tobacco comments:    aware needs to stop   Vaping Use   Vaping Use: Never used  Substance and Sexual Activity   Alcohol use: Yes    Alcohol/week: 1.0 standard drink of alcohol    Types: 1 Glasses of wine per week   Drug use: No   Sexual activity: Not Currently    Birth control/protection: Abstinence, Surgical    Comment: Hysterectomy  Other Topics Concern   Not on file  Social History Narrative   Not on file   Social Determinants of Health  Financial Resource Strain: Low Risk  (05/22/2021)   Overall Financial Resource Strain (CARDIA)    Difficulty of Paying Living Expenses: Not hard at all  Food Insecurity: No Food Insecurity (05/22/2021)   Hunger Vital Sign    Worried About Running Out of Food in the Last Year: Never true    Ran Out of Food in the Last Year: Never true  Transportation Needs: No Transportation Needs (05/22/2021)   PRAPARE - Hydrologist (Medical): No    Lack of Transportation (Non-Medical): No  Physical Activity: Insufficiently Active (05/22/2021)   Exercise Vital Sign    Days of Exercise per Week: 2 days    Minutes of Exercise per Session: 30 min  Stress: No Stress Concern Present (05/22/2021)   [place]    Feeling of Stress : Not at all  Social Connections: Moderately Integrated (05/22/2021)   Social Connection and Isolation Panel [NHANES]    Frequency  of Communication with Friends and Family: More than three times a week    Frequency of Social Gatherings with Friends and Family: Three times a week    Attends Religious Services: More than 4 times per year    Active Member of Clubs or Organizations: No    Attends Archivist Meetings: Never    Marital Status: Married  Human resources officer Violence: Not At Risk (05/22/2021)   Humiliation, Afraid, Rape, and Kick questionnaire    Fear of Current or Ex-Partner: No    Emotionally Abused: No    Physically Abused: No    Sexually Abused: No    Review of Systems: See HPI, otherwise negative ROS  Physical Exam: BP (!) 147/78   Pulse (!) 51   Temp 98.2 F (36.8 C) (Temporal)   Ht '5\' 7"'$  (1.702 m)   Wt 69.4 kg   SpO2 98%   BMI 23.96 kg/m  General:   Alert,  pleasant and cooperative in NAD Head:  Normocephalic and atraumatic. Lungs:  Clear to auscultation.    Heart:  Regular rate and rhythm.   Impression/Plan: Caroline Harris is here for ophthalmic surgery.  Risks, benefits, limitations, and alternatives regarding ophthalmic surgery have been reviewed with the patient.  Questions have been answered.  All parties agreeable.   Leandrew Koyanagi, MD  10/09/2021, 1:10 PM

## 2021-10-09 NOTE — Anesthesia Postprocedure Evaluation (Signed)
Anesthesia Post Note  Patient: Caroline Harris  Procedure(s) Performed: CATARACT EXTRACTION PHACO AND INTRAOCULAR LENS PLACEMENT (IOC) LEFT 8.38 01:09.7 (Left: Eye)     Patient location during evaluation: PACU Anesthesia Type: MAC Level of consciousness: awake and alert Pain management: pain level controlled Vital Signs Assessment: post-procedure vital signs reviewed and stable Respiratory status: spontaneous breathing, nonlabored ventilation, respiratory function stable and patient connected to nasal cannula oxygen Cardiovascular status: stable and blood pressure returned to baseline Postop Assessment: no apparent nausea or vomiting Anesthetic complications: no   No notable events documented.  Martha Clan

## 2021-10-09 NOTE — Anesthesia Preprocedure Evaluation (Signed)
Anesthesia Evaluation  Patient identified by MRN, date of birth, ID band Patient awake    Reviewed: Allergy & Precautions, H&P , NPO status , Patient's Chart, lab work & pertinent test results, reviewed documented beta blocker date and time   History of Anesthesia Complications Negative for: history of anesthetic complications  Airway Mallampati: I  TM Distance: >3 FB Neck ROM: full    Dental no notable dental hx. (+) Caps, Partial Upper, Dental Advidsory Given, Chipped, Missing, Poor Dentition   Pulmonary neg shortness of breath, asthma , neg sleep apnea, COPD, neg recent URI, Current Smoker,    Pulmonary exam normal breath sounds clear to auscultation       Cardiovascular Exercise Tolerance: Good negative cardio ROS Normal cardiovascular exam Rhythm:regular Rate:Normal     Neuro/Psych negative neurological ROS  negative psych ROS   GI/Hepatic negative GI ROS, Neg liver ROS,   Endo/Other  negative endocrine ROS  Renal/GU negative Renal ROS  negative genitourinary   Musculoskeletal   Abdominal   Peds  Hematology negative hematology ROS (+)   Anesthesia Other Findings Past Medical History: No date: Arthritis     Comment:  In fingers March, 2023: Cataract     Comment:  Will have removed No date: Colitis No date: Collagenous colitis No date: Diverticulitis 03/29/2021: Diverticulitis No date: Febrile seizure ([place])     Comment:  as infant No date: Hyperlipidemia No date: Osteopenia No date: Restless leg No date: Wears dentures     Comment:  partial lower   Reproductive/Obstetrics negative OB ROS                             Anesthesia Physical Anesthesia Plan  ASA: 2  Anesthesia Plan: MAC   Post-op Pain Management:    Induction: Intravenous  PONV Risk Score and Plan: 1 and Midazolam and Treatment may vary due to age or medical condition  Airway Management Planned: Natural  Airway and Nasal Cannula  Additional Equipment:   Intra-op Plan:   Post-operative Plan:   Informed Consent: I have reviewed the patients History and Physical, chart, labs and discussed the procedure including the risks, benefits and alternatives for the proposed anesthesia with the patient or authorized representative who has indicated his/her understanding and acceptance.     Dental Advisory Given  Plan Discussed with: Anesthesiologist, CRNA and Surgeon  Anesthesia Plan Comments:         Anesthesia Quick Evaluation

## 2021-10-09 NOTE — Transfer of Care (Signed)
Immediate Anesthesia Transfer of Care Note  Patient: Caroline Harris  Procedure(s) Performed: CATARACT EXTRACTION PHACO AND INTRAOCULAR LENS PLACEMENT (IOC) LEFT 8.38 01:09.7 (Left: Eye)  Patient Location: PACU  Anesthesia Type: MAC  Level of Consciousness: awake, alert  and patient cooperative  Airway and Oxygen Therapy: Patient Spontanous Breathing and Patient connected to supplemental oxygen  Post-op Assessment: Post-op Vital signs reviewed, Patient's Cardiovascular Status Stable, Respiratory Function Stable, Patent Airway and No signs of Nausea or vomiting  Post-op Vital Signs: Reviewed and stable  Complications: No notable events documented.

## 2021-10-09 NOTE — Op Note (Signed)
OPERATIVE NOTE  Caroline Harris [id number] 10/09/2021   PREOPERATIVE DIAGNOSIS:  Nuclear sclerotic cataract left eye. H25.12   POSTOPERATIVE DIAGNOSIS:    Nuclear sclerotic cataract left eye.     PROCEDURE:  Phacoemusification with posterior chamber intraocular lens placement of the left eye  Ultrasound time: Procedure(s): CATARACT EXTRACTION PHACO AND INTRAOCULAR LENS PLACEMENT (IOC) LEFT 8.38 01:09.7 (Left)  LENS:   Implant Name Type Inv. Item Serial No. Manufacturer Lot No. LRB No. Used Action  LENS IOL TECNIS EYHANCE 21.0 - [id number] Intraocular Lens LENS IOL TECNIS EYHANCE 21.0 [id number] SIGHTPATH  Left 1 Implanted      SURGEON:  Wyonia Hough, MD   ANESTHESIA:  Topical with tetracaine drops and 2% Xylocaine jelly, augmented with 1% preservative-free intracameral lidocaine.    COMPLICATIONS:  None.   DESCRIPTION OF PROCEDURE:  The patient was identified in the holding room and transported to the operating room and placed in the supine position under the operating microscope.  The left eye was identified as the operative eye and it was prepped and draped in the usual sterile ophthalmic fashion.   A 1 millimeter clear-corneal paracentesis was made at the 1:30 position.  0.5 ml of preservative-free 1% lidocaine was injected into the anterior chamber.  The anterior chamber was filled with Viscoat viscoelastic.  A 2.4 millimeter keratome was used to make a near-clear corneal incision at the 10:30 position.  .  A curvilinear capsulorrhexis was made with a cystotome and capsulorrhexis forceps.  Balanced salt solution was used to hydrodissect and hydrodelineate the nucleus.   Phacoemulsification was then used in stop and chop fashion to remove the lens nucleus and epinucleus.  The remaining cortex was then removed using the irrigation and aspiration handpiece. Provisc was then placed into the capsular bag to distend it for lens placement.  A lens was then injected into the  capsular bag.  The remaining viscoelastic was aspirated.   Wounds were hydrated with balanced salt solution.  The anterior chamber was inflated to a physiologic pressure with balanced salt solution.  No wound leaks were noted. Cefuroxime 0.1 ml of a '10mg'$ /ml solution was injected into the anterior chamber for a dose of 1 mg of intracameral antibiotic at the completion of the case.   Timolol and Brimonidine drops were applied to the eye.  The patient was taken to the recovery room in stable condition without complications of anesthesia or surgery.  Kort Stettler 10/09/2021, 1:34 PM

## 2021-10-10 ENCOUNTER — Encounter: Payer: Self-pay | Admitting: Ophthalmology

## 2021-10-15 DIAGNOSIS — L578 Other skin changes due to chronic exposure to nonionizing radiation: Secondary | ICD-10-CM | POA: Diagnosis not present

## 2021-10-15 DIAGNOSIS — Z86018 Personal history of other benign neoplasm: Secondary | ICD-10-CM | POA: Diagnosis not present

## 2021-10-15 DIAGNOSIS — L570 Actinic keratosis: Secondary | ICD-10-CM | POA: Diagnosis not present

## 2021-10-15 DIAGNOSIS — Z85828 Personal history of other malignant neoplasm of skin: Secondary | ICD-10-CM | POA: Diagnosis not present

## 2021-10-15 DIAGNOSIS — Z872 Personal history of diseases of the skin and subcutaneous tissue: Secondary | ICD-10-CM | POA: Diagnosis not present

## 2021-10-15 DIAGNOSIS — H2511 Age-related nuclear cataract, right eye: Secondary | ICD-10-CM | POA: Diagnosis not present

## 2021-10-16 DIAGNOSIS — M25641 Stiffness of right hand, not elsewhere classified: Secondary | ICD-10-CM | POA: Diagnosis not present

## 2021-10-22 NOTE — Discharge Instructions (Signed)

## 2021-10-23 ENCOUNTER — Ambulatory Visit
Admission: RE | Admit: 2021-10-23 | Discharge: 2021-10-23 | Disposition: A | Discharge status: Home / Self Care | Payer: PPO | Attending: Ophthalmology | Admitting: Ophthalmology

## 2021-10-23 ENCOUNTER — Encounter: Payer: Self-pay | Admitting: Ophthalmology

## 2021-10-23 ENCOUNTER — Other Ambulatory Visit: Payer: Self-pay

## 2021-10-23 ENCOUNTER — Ambulatory Visit: Payer: PPO | Admitting: Anesthesiology

## 2021-10-23 ENCOUNTER — Encounter
Admission: RE | Disposition: A | Discharge status: Home / Self Care | Payer: Self-pay | Source: Home / Self Care | Attending: Ophthalmology

## 2021-10-23 ENCOUNTER — Ambulatory Visit (AMBULATORY_SURGERY_CENTER): Payer: PPO | Admitting: Anesthesiology

## 2021-10-23 DIAGNOSIS — H2511 Age-related nuclear cataract, right eye: Secondary | ICD-10-CM

## 2021-10-23 DIAGNOSIS — J45909 Unspecified asthma, uncomplicated: Secondary | ICD-10-CM | POA: Diagnosis not present

## 2021-10-23 DIAGNOSIS — J449 Chronic obstructive pulmonary disease, unspecified: Secondary | ICD-10-CM | POA: Insufficient documentation

## 2021-10-23 DIAGNOSIS — F17210 Nicotine dependence, cigarettes, uncomplicated: Secondary | ICD-10-CM | POA: Diagnosis not present

## 2021-10-23 HISTORY — PX: CATARACT EXTRACTION W/PHACO: SHX586

## 2021-10-23 SURGERY — PHACOEMULSIFICATION, CATARACT, WITH IOL INSERTION
Anesthesia: Monitor Anesthesia Care | Site: Eye | Laterality: Right

## 2021-10-23 MED ORDER — SIGHTPATH DOSE#1 BSS IO SOLN
INTRAOCULAR | Status: DC | PRN
  Administered 2021-10-23: 15 mL via INTRAOCULAR

## 2021-10-23 MED ORDER — SIGHTPATH DOSE#1 BSS IO SOLN
INTRAOCULAR | Status: DC | PRN
  Administered 2021-10-23: 2 mL

## 2021-10-23 MED ORDER — SIGHTPATH DOSE#1 BSS IO SOLN
INTRAOCULAR | Status: DC | PRN
  Administered 2021-10-23: 53 mL via OPHTHALMIC

## 2021-10-23 MED ORDER — ARMC OPHTHALMIC DILATING DROPS
1.0000 | OPHTHALMIC | Status: DC | PRN
  Administered 2021-10-23 (×3): 1 via OPHTHALMIC

## 2021-10-23 MED ORDER — MIDAZOLAM HCL 2 MG/2ML IJ SOLN
INTRAMUSCULAR | Status: DC | PRN
  Administered 2021-10-23: 1 mg via INTRAVENOUS

## 2021-10-23 MED ORDER — SIGHTPATH DOSE#1 NA HYALUR & NA CHOND-NA HYALUR IO KIT
PACK | INTRAOCULAR | Status: DC | PRN
  Administered 2021-10-23: 1 via OPHTHALMIC

## 2021-10-23 MED ORDER — BRIMONIDINE TARTRATE-TIMOLOL 0.2-0.5 % OP SOLN
OPHTHALMIC | Status: DC | PRN
  Administered 2021-10-23: 1 [drp] via OPHTHALMIC

## 2021-10-23 MED ORDER — FENTANYL CITRATE (PF) 100 MCG/2ML IJ SOLN
INTRAMUSCULAR | Status: DC | PRN
  Administered 2021-10-23: 50 ug via INTRAVENOUS

## 2021-10-23 MED ORDER — TETRACAINE HCL 0.5 % OP SOLN
1.0000 [drp] | OPHTHALMIC | Status: DC | PRN
  Administered 2021-10-23 (×3): 1 [drp] via OPHTHALMIC

## 2021-10-23 MED ORDER — CEFUROXIME OPHTHALMIC INJECTION 1 MG/0.1 ML
INJECTION | OPHTHALMIC | Status: DC | PRN
  Administered 2021-10-23: 0.1 mL via INTRACAMERAL

## 2021-10-23 SURGICAL SUPPLY — 13 items
CANNULA ANT/CHMB 27G (MISCELLANEOUS) IMPLANT
CANNULA ANT/CHMB 27GA (MISCELLANEOUS) IMPLANT
CATARACT SUITE SIGHTPATH (MISCELLANEOUS) ×1 IMPLANT
FEE CATARACT SUITE SIGHTPATH (MISCELLANEOUS) ×1 IMPLANT
GLOVE SRG 8 PF TXTR STRL LF DI (GLOVE) ×1 IMPLANT
GLOVE SURG ENC TEXT LTX SZ7.5 (GLOVE) ×1 IMPLANT
GLOVE SURG UNDER POLY LF SZ8 (GLOVE) ×1
LENS IOL TECNIS EYHANCE 22.0 (Intraocular Lens) IMPLANT
NDL FILTER BLUNT 18X1 1/2 (NEEDLE) ×1 IMPLANT
NEEDLE FILTER BLUNT 18X 1/2SAF (NEEDLE) ×1
NEEDLE FILTER BLUNT 18X1 1/2 (NEEDLE) ×1 IMPLANT
SYR 3ML LL SCALE MARK (SYRINGE) ×1 IMPLANT
WATER STERILE IRR 250ML POUR (IV SOLUTION) ×1 IMPLANT

## 2021-10-23 NOTE — Anesthesia Postprocedure Evaluation (Signed)
Anesthesia Post Note  Patient: Caroline Harris  Procedure(s) Performed: CATARACT EXTRACTION PHACO AND INTRAOCULAR LENS PLACEMENT (IOC) RIGHT 5.51 00:48.8 (Right: Eye)     Patient location during evaluation: PACU Anesthesia Type: MAC Level of consciousness: awake and alert Pain management: pain level controlled Vital Signs Assessment: post-procedure vital signs reviewed and stable Respiratory status: spontaneous breathing, nonlabored ventilation, respiratory function stable and patient connected to nasal cannula oxygen Cardiovascular status: stable and blood pressure returned to baseline Postop Assessment: no apparent nausea or vomiting Anesthetic complications: no   There were no known notable events for this encounter.  Arita Miss

## 2021-10-23 NOTE — H&P (Signed)
[facility]   Primary Care Physician:  Juline Patch, MD Ophthalmologist: Dr. Leandrew Koyanagi  Pre-Procedure History & Physical: HPI:  Caroline Harris is a 76 y.o. female here for ophthalmic surgery.   Past Medical History:  Diagnosis Date   Arthritis    In fingers   Cataract March, 2023   Will have removed   Colitis    Collagenous colitis    Diverticulitis    Diverticulitis 03/29/2021   Febrile seizure ([place])    as infant   Hyperlipidemia    Osteopenia    Restless leg    Wears dentures    partial lower    Past Surgical History:  Procedure Laterality Date   ABDOMINAL HYSTERECTOMY     CATARACT EXTRACTION W/PHACO Left 10/09/2021   Procedure: CATARACT EXTRACTION PHACO AND INTRAOCULAR LENS PLACEMENT ([place]) LEFT 8.38 01:09.7;  Surgeon: Leandrew Koyanagi, MD;  Location: [place];  Service: Ophthalmology;  Laterality: Left;   COLONOSCOPY     TONSILLECTOMY     TUBAL LIGATION      Prior to Admission medications   Medication Sig Start Date End Date Taking? Authorizing Provider  acetaminophen (TYLENOL) 325 MG tablet Take 650 mg by mouth every 6 (six) hours as needed.   Yes [provider]  aspirin 81 MG tablet Take 81 mg by mouth daily.   Yes [provider]  budesonide (ENTOCORT EC) 3 MG 24 hr capsule Take 1 capsule by mouth every morning.   Yes [provider]  calcium carbonate 1250 MG capsule Take 1,250 mg by mouth 2 (two) times daily with a meal.   Yes [provider]  lovastatin (MEVACOR) 20 MG tablet Take 1 tablet (20 mg total) by mouth daily. 09/25/21  Yes Juline Patch, MD  Magnesium 400 MG TABS  01/01/21  Yes [provider]  Melatonin 10 MG TABS Take 1 tablet by mouth at bedtime.   Yes [provider]  montelukast (SINGULAIR) 10 MG tablet TAKE (1) TABLET BY MOUTH EVERY DAY 09/25/21  Yes Juline Patch, MD  Multiple Vitamins-Minerals (CENTRUM SILVER ADULT 50+ PO) Take 1 capsule by mouth daily  at 6 (six) AM.   Yes [provider]    Allergies as of 08/12/2021   (No Known Allergies)    Family History  Problem Relation Age of Onset   Breast cancer Mother 69   Cancer Mother    Miscarriages / [place] Mother    Heart attack Father    Heart disease Father     Social History   Socioeconomic History   Marital status: Married    Spouse name: Not on file   Number of children: 3   Years of education: Not on file   Highest education level: Bachelor's degree (e.g., BA, AB, BS)  Occupational History    Comment: part time music teacher at [employer]  Tobacco Use   Smoking status: Every Day    Packs/day: 0.25    Years: 50.00    Total pack years: 12.50    Types: Cigarettes    Start date: 02/17/1958   Smokeless tobacco: Never   Tobacco comments:    aware needs to stop   Vaping Use   Vaping Use: Never used  Substance and Sexual Activity   Alcohol use: Yes    Alcohol/week: 1.0 standard drink of alcohol    Types: 1 Glasses of wine per week   Drug use: No   Sexual activity: Not Currently  Birth control/protection: Abstinence, Surgical    Comment: Hysterectomy  Other Topics Concern   Not on file  Social History Narrative   Not on file   Social Determinants of Health   Financial Resource Strain: Low Risk  (05/22/2021)   Overall Financial Resource Strain (CARDIA)    Difficulty of Paying Living Expenses: Not hard at all  Food Insecurity: No Food Insecurity (05/22/2021)   Hunger Vital Sign    Worried About Running Out of Food in the Last Year: Never true    Ran Out of Food in the Last Year: Never true  Transportation Needs: No Transportation Needs (05/22/2021)   PRAPARE - Hydrologist (Medical): No    Lack of Transportation (Non-Medical): No  Physical Activity: Insufficiently Active (05/22/2021)   Exercise Vital Sign    Days of Exercise per Week: 2 days    Minutes of Exercise per Session: 30 min  Stress: No Stress  Concern Present (05/22/2021)   [place]    Feeling of Stress : Not at all  Social Connections: Moderately Integrated (05/22/2021)   Social Connection and Isolation Panel [NHANES]    Frequency of Communication with Friends and Family: More than three times a week    Frequency of Social Gatherings with Friends and Family: Three times a week    Attends Religious Services: More than 4 times per year    Active Member of Clubs or Organizations: No    Attends Archivist Meetings: Never    Marital Status: Married  Human resources officer Violence: Not At Risk (05/22/2021)   Humiliation, Afraid, Rape, and Kick questionnaire    Fear of Current or Ex-Partner: No    Emotionally Abused: No    Physically Abused: No    Sexually Abused: No    Review of Systems: See HPI, otherwise negative ROS  Physical Exam: There were no vitals taken for this visit. General:   Alert,  pleasant and cooperative in NAD Head:  Normocephalic and atraumatic. Lungs:  Clear to auscultation.    Heart:  Regular rate and rhythm.   Impression/Plan: Caroline Harris is here for ophthalmic surgery.  Risks, benefits, limitations, and alternatives regarding ophthalmic surgery have been reviewed with the patient.  Questions have been answered.  All parties agreeable.   Leandrew Koyanagi, MD  10/23/2021, 11:46 AM

## 2021-10-23 NOTE — Transfer of Care (Signed)
Immediate Anesthesia Transfer of Care Note  Patient: Caroline Harris  Procedure(s) Performed: CATARACT EXTRACTION PHACO AND INTRAOCULAR LENS PLACEMENT (IOC) RIGHT 5.51 00:48.8 (Right: Eye)  Patient Location: PACU  Anesthesia Type: No value filed.  Level of Consciousness: awake, alert  and patient cooperative  Airway and Oxygen Therapy: Patient Spontanous Breathing and Patient connected to supplemental oxygen  Post-op Assessment: Post-op Vital signs reviewed, Patient's Cardiovascular Status Stable, Respiratory Function Stable, Patent Airway and No signs of Nausea or vomiting  Post-op Vital Signs: Reviewed and stable  Complications: There were no known notable events for this encounter.

## 2021-10-23 NOTE — Anesthesia Preprocedure Evaluation (Signed)
Anesthesia Evaluation  Patient identified by MRN, date of birth, ID band Patient awake    Reviewed: Allergy & Precautions, H&P , NPO status , Patient's Chart, lab work & pertinent test results, reviewed documented beta blocker date and time   History of Anesthesia Complications Negative for: history of anesthetic complications  Airway Mallampati: I  TM Distance: >3 FB Neck ROM: full    Dental  (+) Caps, Partial Upper, Dental Advidsory Given, Chipped, Missing, Poor Dentition   Pulmonary neg shortness of breath, asthma , neg sleep apnea, COPD, neg recent URI, Current Smoker and Patient abstained from smoking.,    Pulmonary exam normal breath sounds clear to auscultation       Cardiovascular Exercise Tolerance: Good negative cardio ROS Normal cardiovascular exam Rhythm:regular Rate:Normal     Neuro/Psych negative neurological ROS  negative psych ROS   GI/Hepatic negative GI ROS, Neg liver ROS,   Endo/Other  negative endocrine ROS  Renal/GU negative Renal ROS  negative genitourinary   Musculoskeletal   Abdominal   Peds  Hematology negative hematology ROS (+)   Anesthesia Other Findings Past Medical History: No date: Arthritis     Comment:  In fingers March, 2023: Cataract     Comment:  Will have removed No date: Colitis No date: Collagenous colitis No date: Diverticulitis 03/29/2021: Diverticulitis No date: Febrile seizure ([place])     Comment:  as infant No date: Hyperlipidemia No date: Osteopenia No date: Restless leg No date: Wears dentures     Comment:  partial lower   Reproductive/Obstetrics negative OB ROS                             Anesthesia Physical  Anesthesia Plan  ASA: 2  Anesthesia Plan: MAC   Post-op Pain Management:    Induction: Intravenous  PONV Risk Score and Plan: 1 and Midazolam and Treatment may vary due to age or medical condition  Airway Management  Planned: Natural Airway and Nasal Cannula  Additional Equipment:   Intra-op Plan:   Post-operative Plan:   Informed Consent: I have reviewed the patients History and Physical, chart, labs and discussed the procedure including the risks, benefits and alternatives for the proposed anesthesia with the patient or authorized representative who has indicated his/her understanding and acceptance.     Dental Advisory Given  Plan Discussed with: Anesthesiologist, CRNA and Surgeon  Anesthesia Plan Comments: (Explained risks of anesthesia, including PONV, and rare emergencies such as cardiac events, respiratory problems, and allergic reactions, requiring invasive intervention. Discussed the role of CRNA in patient's perioperative care. Patient understands. )        Anesthesia Quick Evaluation

## 2021-10-23 NOTE — Op Note (Signed)
LOCATION:  [place]   PREOPERATIVE DIAGNOSIS:    Nuclear sclerotic cataract right eye. H25.11   POSTOPERATIVE DIAGNOSIS:  Nuclear sclerotic cataract right eye.     PROCEDURE:  Phacoemusification with posterior chamber intraocular lens placement of the right eye   ULTRASOUND TIME: Procedure(s): CATARACT EXTRACTION PHACO AND INTRAOCULAR LENS PLACEMENT (IOC) RIGHT 5.51 00:48.8 (Right)  LENS:   Implant Name Type Inv. Item Serial No. Manufacturer Lot No. LRB No. Used Action  LENS IOL TECNIS EYHANCE 22.0 - [id number] Intraocular Lens LENS IOL TECNIS EYHANCE 22.0 [id number] [employer]  Right 1 Implanted         SURGEON:  Wyonia Hough, MD   ANESTHESIA:  Topical with tetracaine drops and 2% Xylocaine jelly, augmented with 1% preservative-free intracameral lidocaine.    COMPLICATIONS:  None.   DESCRIPTION OF PROCEDURE:  The patient was identified in the holding room and transported to the operating room and placed in the supine position under the operating microscope.  The right eye was identified as the operative eye and it was prepped and draped in the usual sterile ophthalmic fashion.   A 1 millimeter clear-corneal paracentesis was made at the 12:00 position.  0.5 ml of preservative-free 1% lidocaine was injected into the anterior chamber. The anterior chamber was filled with Viscoat viscoelastic.  A 2.4 millimeter keratome was used to make a near-clear corneal incision at the 9:00 position.  A curvilinear capsulorrhexis was made with a cystotome and capsulorrhexis forceps.  Balanced salt solution was used to hydrodissect and hydrodelineate the nucleus.   Phacoemulsification was then used in stop and chop fashion to remove the lens nucleus and epinucleus.  The remaining cortex was then removed using the irrigation and aspiration handpiece. Provisc was then placed into the capsular bag to distend it for lens placement.  A lens was then injected into the capsular bag.  The  remaining viscoelastic was aspirated.   Wounds were hydrated with balanced salt solution.  The anterior chamber was inflated to a physiologic pressure with balanced salt solution.  No wound leaks were noted. Cefuroxime 0.1 ml of a '10mg'$ /ml solution was injected into the anterior chamber for a dose of 1 mg of intracameral antibiotic at the completion of the case.   Timolol and Brimonidine drops were applied to the eye.  The patient was taken to the recovery room in stable condition without complications of anesthesia or surgery.   Shauntell Iglesia 10/23/2021, 12:29 PM

## 2021-10-24 ENCOUNTER — Other Ambulatory Visit: Payer: Self-pay

## 2021-10-24 ENCOUNTER — Encounter: Payer: Self-pay | Admitting: Ophthalmology

## 2021-10-24 ENCOUNTER — Ambulatory Visit (INDEPENDENT_AMBULATORY_CARE_PROVIDER_SITE_OTHER): Payer: PPO | Admitting: Gastroenterology

## 2021-10-24 VITALS — BP 157/80 | HR 65 | Temp 97.7°F | Ht 67.0 in | Wt 154.0 lb

## 2021-10-24 DIAGNOSIS — K52831 Collagenous colitis: Secondary | ICD-10-CM

## 2021-10-24 NOTE — Progress Notes (Signed)
Cephas Darby, MD [address]  [place]  [place], [place] [address]  Main: [phone]  Fax: [phone]    Gastroenterology Consultation  Referring Provider:     Juline Patch, MD Primary Care Physician:  Juline Patch, MD Primary Gastroenterologist:  Dr. Vira Agar Reason for Consultation:  collagenous colitis        HPI:   Caroline Harris is a 76 y.o. female referred by Dr. Juline Patch, MD  for consultation & management of collagenous colitis.  Patient is diagnosed with collagenous colitis based on colonoscopy in 04/2018 after she was having chronic nonbloody diarrhea since 09/2017.  She started on Entocort 9 mg daily which has resulted in resolution of symptoms.  She is currently on 6 mg daily, in clinical remission.  She reports having 1 bowel movement daily.  She does not have any other GI symptoms.  She wanted to discuss about long-term management of collagenous colitis.  Patient is also found to have normocytic anemia.  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  She is currently maintained on budesonide 3 mg daily.  She tried to stop the medication last year which resulted in return of diarrhea.  He does not have any other concerns today.  She wants to know when she is due for next surveillance colonoscopy  Follow-up visit 04/03/2021 Patient was admitted to [facility] on 03/29/2021 secondary to left-sided abdominal pain, CT revealed acute uncomplicated proximal descending colon diverticulitis, she was admitted, received Zosyn and was discharged on Cipro and Flagyl for 10 days.  Patient reports that her abdominal pain has subsided.  However, for the last 2 to 3 days, she has been having nonbloody diarrhea with abdominal bloating.  She continues to take the antibiotics.  She is also taking budesonide 3 mg 1 pill daily.  She is planning to  make a trip to the mountains tomorrow to meet her grandchildren.  She did not try Imodium  Follow-up visit 10/24/2021 Patient is here for follow-up of collagenous colitis.  Patient is doing well on budesonide 3 mg 1 pill daily.  Reports having normal bowel movements.  She does not have any concerns today.  NSAIDs: None  Antiplts/Anticoagulants/Anti thrombotics: None  GI Procedures:  Colonoscopy 05/14/18 + Collagenous Colitis, Serrated Polyp, Repeat 5 years noted    Past Medical History:  Diagnosis Date   Arthritis    In fingers   Cataract March, 2023   Will have removed   Colitis    Collagenous colitis    Diverticulitis    Diverticulitis 03/29/2021   Febrile seizure ([place])    as infant   Hyperlipidemia    Osteopenia    Restless leg    Wears dentures    partial lower    Past Surgical History:  Procedure Laterality Date   ABDOMINAL HYSTERECTOMY     CATARACT EXTRACTION W/PHACO Left 10/09/2021   Procedure: CATARACT EXTRACTION PHACO AND INTRAOCULAR LENS PLACEMENT ([place]) LEFT 8.38 01:09.7;  Surgeon: Leandrew Koyanagi, MD;  Location: [place];  Service: Ophthalmology;  Laterality: Left;   CATARACT EXTRACTION W/PHACO Right 10/23/2021   Procedure: CATARACT EXTRACTION PHACO AND INTRAOCULAR LENS PLACEMENT (IOC) RIGHT 5.51 00:48.8;  Surgeon: Leandrew Koyanagi, MD;  Location: [place];  Service: Ophthalmology;  Laterality: Right;   COLONOSCOPY     TONSILLECTOMY     TUBAL LIGATION  Current Outpatient Medications:    aspirin 81 MG tablet, Take 81 mg by mouth daily., Disp: , Rfl:    budesonide (ENTOCORT EC) 3 MG 24 hr capsule, Take 1 capsule by mouth every morning., Disp: , Rfl:    calcium carbonate 1250 MG capsule, Take 1,250 mg by mouth 2 (two) times daily with a meal., Disp: , Rfl:    lovastatin (MEVACOR) 20 MG tablet, Take 1 tablet (20 mg total) by mouth daily., Disp: 90 tablet, Rfl: 0   Magnesium 400 MG TABS, , Disp: , Rfl:    Melatonin 10 MG TABS, Take  1 tablet by mouth at bedtime., Disp: , Rfl:    montelukast (SINGULAIR) 10 MG tablet, TAKE (1) TABLET BY MOUTH EVERY DAY, Disp: 90 tablet, Rfl: 0   Multiple Vitamins-Minerals (CENTRUM SILVER ADULT 50+ PO), Take 1 capsule by mouth daily at 6 (six) AM., Disp: , Rfl:    Family History  Problem Relation Age of Onset   Breast cancer Mother 63   Cancer Mother    Miscarriages / Stillbirths Mother    Heart attack Father    Heart disease Father      Social History   Tobacco Use   Smoking status: Every Day    Packs/day: 0.25    Years: 50.00    Total pack years: 12.50    Types: Cigarettes    Start date: 02/17/1958   Smokeless tobacco: Never   Tobacco comments:    aware needs to stop   Vaping Use   Vaping Use: Never used  Substance Use Topics   Alcohol use: Yes    Alcohol/week: 1.0 standard drink of alcohol    Types: 1 Glasses of wine per week   Drug use: No    Allergies as of 10/24/2021   (No Known Allergies)    Review of Systems:    All systems reviewed and negative except where noted in HPI.   Physical Exam:  BP (!) 157/80 (BP Location: Left Arm, Patient Position: Sitting, Cuff Size: Normal)   Pulse 65   Temp 97.7 F (36.5 C) (Oral)   Ht '5\' 7"'$  (1.702 m)   Wt 154 lb (69.9 kg)   BMI 24.12 kg/m  No LMP recorded. Patient has had a hysterectomy.  General:   Alert,  Well-developed, well-nourished, pleasant and cooperative in NAD Head:  Normocephalic and atraumatic. Eyes:  Sclera clear, no icterus.   Conjunctiva pink. Ears:  Normal auditory acuity. Nose:  No deformity, discharge, or lesions. Mouth:  No deformity or lesions,oropharynx pink & moist. Neck:  Supple; no masses or thyromegaly. Lungs:  Respirations even and unlabored.  Clear throughout to auscultation.   No wheezes, crackles, or rhonchi. No acute distress. Heart:  Regular rate and rhythm; no murmurs, clicks, rubs, or gallops. Abdomen:  Normal bowel sounds. Soft, non-tender and non-distended without masses,  hepatosplenomegaly or hernias noted.  No guarding or rebound tenderness.   Rectal: Not performed Msk:  Symmetrical without gross deformities. Good, equal movement & strength bilaterally. Pulses:  Normal pulses noted. Extremities:  No clubbing or edema.  No cyanosis. Neurologic:  Alert and oriented x3;  grossly normal neurologically. Skin:  Intact without significant lesions or rashes. No jaundice. Psych:  Alert and cooperative. Normal mood and affect.  Imaging Studies: Reviewed  Assessment and Plan:   ELLYSON RARICK is a 76 y.o. female with history of chronic tobacco use, restless leg syndrome, history of collagenous colitis on budesonide   Collagenous colitis: In clinical remission Continue  budesonide 3 mg 1 pill daily  Serrated polyp of the colon, less than 1 cm, Recommend surveillance colonoscopy in 04/2023   Follow up as needed   Cephas Darby, MD

## 2021-10-28 ENCOUNTER — Encounter: Payer: Self-pay | Admitting: Family Medicine

## 2021-10-28 ENCOUNTER — Ambulatory Visit (INDEPENDENT_AMBULATORY_CARE_PROVIDER_SITE_OTHER): Payer: PPO | Admitting: Family Medicine

## 2021-10-28 VITALS — BP 120/76 | HR 60 | Ht 67.0 in | Wt 153.0 lb

## 2021-10-28 DIAGNOSIS — J0100 Acute maxillary sinusitis, unspecified: Secondary | ICD-10-CM | POA: Diagnosis not present

## 2021-10-28 MED ORDER — AZITHROMYCIN 250 MG PO TABS: ORAL_TABLET | ORAL | 0 refills | Status: AC

## 2021-10-28 MED ORDER — BENZONATATE 100 MG PO CAPS: 100.0000 mg | ORAL_CAPSULE | Freq: Three times a day (TID) | ORAL | 0 refills | Status: DC | PRN

## 2021-10-28 NOTE — Progress Notes (Signed)
Date:  10/28/2021   Name:  Caroline Harris   DOB:  05-11-1945   MRN:  [id number]   Chief Complaint: Sinusitis (Dull headache, coughing green stuff up. )  Sinusitis This is a new problem. The current episode started in the past 7 days. The problem is unchanged. There has been no fever. The pain is moderate. Associated symptoms include congestion, coughing, headaches, sinus pressure and sneezing. Pertinent negatives include no chills, diaphoresis, ear pain, shortness of breath or sore throat. Past treatments include acetaminophen. The treatment provided mild relief.    Lab Results  Component Value Date   NA 139 09/25/2021   K 4.5 09/25/2021   CO2 24 09/25/2021   GLUCOSE 88 09/25/2021   BUN 13 09/25/2021   CREATININE 0.68 09/25/2021   CALCIUM 9.4 09/25/2021   EGFR 90 09/25/2021   GFRNONAA >60 03/31/2021   Lab Results  Component Value Date   CHOL 176 09/25/2021   HDL 84 09/25/2021   LDLCALC 77 09/25/2021   TRIG 85 09/25/2021   CHOLHDL 1.7 11/05/2018   Lab Results  Component Value Date   TSH 1.120 06/01/2017   No results found for: "HGBA1C" Lab Results  Component Value Date   WBC 8.0 09/25/2021   HGB 12.9 09/25/2021   HCT 39.1 09/25/2021   MCV 95 09/25/2021   PLT 289 09/25/2021   Lab Results  Component Value Date   ALT 12 03/30/2021   AST 15 03/30/2021   ALKPHOS 33 (L) 03/30/2021   BILITOT 0.9 03/30/2021   No results found for: "25OHVITD2", "25OHVITD3", "VD25OH"   Review of Systems  Constitutional:  Positive for fever. Negative for chills and diaphoresis.  HENT:  Positive for congestion, postnasal drip, rhinorrhea, sinus pressure, sinus pain and sneezing. Negative for ear pain and sore throat.   Respiratory:  Positive for cough. Negative for shortness of breath and wheezing.   Cardiovascular:  Negative for chest pain and palpitations.  Neurological:  Positive for headaches.    Patient Active Problem List   Diagnosis Date Noted   Other synovitis and  tenosynovitis, unspecified hand 03/13/2021   Inflammatory pain 03/13/2021   Ganglion cyst of finger of right hand 06/25/2020   Pain in finger of right hand 06/25/2020   Aortic atherosclerosis ([place]) 11/05/2018   COPD, moderate ([place]) 08/23/2015   Asthma, mild intermittent 06/07/2015   Hyperlipidemia 06/07/2015    No Known Allergies  Past Surgical History:  Procedure Laterality Date   ABDOMINAL HYSTERECTOMY     CATARACT EXTRACTION W/PHACO Left 10/09/2021   Procedure: CATARACT EXTRACTION PHACO AND INTRAOCULAR LENS PLACEMENT ([place]) LEFT 8.38 01:09.7;  Surgeon: Leandrew Koyanagi, MD;  Location: [place];  Service: Ophthalmology;  Laterality: Left;   CATARACT EXTRACTION W/PHACO Right 10/23/2021   Procedure: CATARACT EXTRACTION PHACO AND INTRAOCULAR LENS PLACEMENT ([facility]) RIGHT 5.51 00:48.8;  Surgeon: Leandrew Koyanagi, MD;  Location: [place];  Service: Ophthalmology;  Laterality: Right;   COLONOSCOPY     TONSILLECTOMY     TUBAL LIGATION      Social History   Tobacco Use   Smoking status: Every Day    Packs/day: 0.25    Years: 50.00    Total pack years: 12.50    Types: Cigarettes    Start date: 02/17/1958   Smokeless tobacco: Never   Tobacco comments:    aware needs to stop   Vaping Use   Vaping Use: Never used  Substance Use Topics   Alcohol use: Yes    Alcohol/week:  1.0 standard drink of alcohol    Types: 1 Glasses of wine per week   Drug use: No     Medication list has been reviewed and updated.  Current Meds  Medication Sig   aspirin 81 MG tablet Take 81 mg by mouth daily.   budesonide (ENTOCORT EC) 3 MG 24 hr capsule Take 1 capsule by mouth every morning.   calcium carbonate 1250 MG capsule Take 1,250 mg by mouth 2 (two) times daily with a meal.   lovastatin (MEVACOR) 20 MG tablet Take 1 tablet (20 mg total) by mouth daily.   Magnesium 400 MG TABS    Melatonin 10 MG TABS Take 1 tablet by mouth at bedtime.   montelukast (SINGULAIR) 10 MG tablet  TAKE (1) TABLET BY MOUTH EVERY DAY   Multiple Vitamins-Minerals (CENTRUM SILVER ADULT 50+ PO) Take 1 capsule by mouth daily at 6 (six) AM.       10/28/2021    1:33 PM 09/25/2021    9:21 AM 02/26/2021    9:20 AM 08/24/2020    8:13 AM  GAD 7 : Generalized Anxiety Score  Nervous, Anxious, on Edge 0 0 0 0  Control/stop worrying 0 0 0 0  Worry too much - different things 0 0 0 0  Trouble relaxing 0 0 0 0  Restless 0 0 0 0  Easily annoyed or irritable 0 0 0 0  Afraid - awful might happen 0 0 0 0  Total GAD 7 Score 0 0 0 0  Anxiety Difficulty Not difficult at all Not difficult at all Not difficult at all        10/28/2021    1:32 PM 09/25/2021    9:21 AM 05/22/2021   10:37 AM  Depression screen PHQ 2/9  Decreased Interest 0 0 0  Down, Depressed, Hopeless 0 0 0  PHQ - 2 Score 0 0 0  Altered sleeping 0 0   Tired, decreased energy 0 0   Change in appetite 0 0   Feeling bad or failure about yourself  0 0   Trouble concentrating 0 0   Moving slowly or fidgety/restless 0 0   Suicidal thoughts 0 0   PHQ-9 Score 0 0   Difficult doing work/chores Not difficult at all Not difficult at all     BP Readings from Last 3 Encounters:  10/28/21 120/76  10/24/21 (!) 157/80  10/23/21 123/73    Physical Exam Vitals and nursing note reviewed.  HENT:     Head: Normocephalic.     Right Ear: Hearing, tympanic membrane, ear canal and external ear normal. There is no impacted cerumen.     Left Ear: Hearing, tympanic membrane, ear canal and external ear normal. There is no impacted cerumen.     Nose: Congestion and rhinorrhea present.     Right Turbinates: Swollen.     Left Turbinates: Swollen.     Right Sinus: Maxillary sinus tenderness and frontal sinus tenderness present.     Left Sinus: Maxillary sinus tenderness and frontal sinus tenderness present.     Mouth/Throat:     Mouth: Mucous membranes are moist.  Neck:     Thyroid: No thyroid mass, thyromegaly or thyroid tenderness.     Vascular:  Normal carotid pulses. No carotid bruit, hepatojugular reflux or JVD.     Trachea: Trachea and phonation normal.  Cardiovascular:     Pulses: Normal pulses.     Heart sounds: S1 normal and S2 normal.  No systolic murmur is present.     No diastolic murmur is present.     No S3 or S4 sounds.  Pulmonary:     Breath sounds: Normal breath sounds. No decreased breath sounds, wheezing, rhonchi or rales.  Musculoskeletal:     Cervical back: Full passive range of motion without pain.     Right lower leg: No edema.     Left lower leg: No edema.  Lymphadenopathy:     Cervical: No cervical adenopathy.     Right cervical: No superficial, deep or posterior cervical adenopathy.    Left cervical: No superficial, deep or posterior cervical adenopathy.     Wt Readings from Last 3 Encounters:  10/28/21 153 lb (69.4 kg)  10/24/21 154 lb (69.9 kg)  10/23/21 152 lb 14.4 oz (69.4 kg)    BP 120/76   Pulse 60   Ht '5\' 7"'  (1.702 m)   Wt 153 lb (69.4 kg)   SpO2 98%   BMI 23.96 kg/m   Assessment and Plan:  1. Acute maxillary sinusitis, recurrence not specified New onset.  Persistent.  History of present illness and exam with tenderness over the maxillary and frontal sinus are tender consistent with sinusitis.  We will treat with azithromycin to 50 mg 2 tablets today followed by 1 a day for 4 days.  Tessalon pearls for an occasional cough. - azithromycin (ZITHROMAX) 250 MG tablet; Take 2 tablets on day 1, then 1 tablet daily on days 2 through 5  Dispense: 6 tablet; Refill: 0 - benzonatate (TESSALON PERLES) 100 MG capsule; Take 1 capsule (100 mg total) by mouth 3 (three) times daily as needed for cough.  Dispense: 20 capsule; Refill: 0    Otilio Miu, MD

## 2021-10-31 ENCOUNTER — Encounter: Payer: Self-pay | Admitting: Gastroenterology

## 2021-10-31 ENCOUNTER — Encounter: Payer: Self-pay | Admitting: Family Medicine

## 2021-10-31 ENCOUNTER — Other Ambulatory Visit: Payer: Self-pay

## 2021-10-31 DIAGNOSIS — R051 Acute cough: Secondary | ICD-10-CM

## 2021-10-31 DIAGNOSIS — Z4789 Encounter for other orthopedic aftercare: Secondary | ICD-10-CM | POA: Diagnosis not present

## 2021-10-31 DIAGNOSIS — M25641 Stiffness of right hand, not elsewhere classified: Secondary | ICD-10-CM | POA: Diagnosis not present

## 2021-10-31 MED ORDER — GUAIFENESIN-CODEINE 100-10 MG/5ML PO SOLN: 10.0000 mL | Freq: Three times a day (TID) | ORAL | 0 refills | Status: DC | PRN

## 2021-10-31 NOTE — Progress Notes (Signed)
Printed [place]

## 2021-11-11 ENCOUNTER — Encounter: Payer: Self-pay | Admitting: Family Medicine

## 2021-11-11 ENCOUNTER — Ambulatory Visit (INDEPENDENT_AMBULATORY_CARE_PROVIDER_SITE_OTHER): Payer: PPO | Admitting: Family Medicine

## 2021-11-11 VITALS — BP 130/76 | HR 64 | Ht 67.0 in | Wt 152.0 lb

## 2021-11-11 DIAGNOSIS — J301 Allergic rhinitis due to pollen: Secondary | ICD-10-CM | POA: Diagnosis not present

## 2021-11-11 DIAGNOSIS — Z23 Encounter for immunization: Secondary | ICD-10-CM | POA: Diagnosis not present

## 2021-11-11 MED ORDER — AZELASTINE HCL 0.1 % NA SOLN: 2.0000 | Freq: Two times a day (BID) | NASAL | 12 refills | Status: DC

## 2021-11-11 NOTE — Progress Notes (Signed)
Date:  11/11/2021   Name:  Caroline Harris   DOB:  [birth year]   MRN:  [id number]   Chief Complaint: Allergic Rhinitis  (Clear "snot" draining causing a cough) and Flu Vaccine  URI  This is a new problem. The current episode started in the past 7 days. The problem has been gradually improving. There has been no fever. The fever has been present for Less than 1 day. Associated symptoms include congestion, coughing and rhinorrhea. Pertinent negatives include no abdominal pain, chest pain, diarrhea, dysuria, ear pain, headaches, joint pain, joint swelling, nausea, neck pain, plugged ear sensation, rash, sinus pain, sneezing, sore throat, swollen glands, vomiting or wheezing. The treatment provided mild relief.    Lab Results  Component Value Date   NA 139 09/25/2021   K 4.5 09/25/2021   CO2 24 09/25/2021   GLUCOSE 88 09/25/2021   BUN 13 09/25/2021   CREATININE 0.68 09/25/2021   CALCIUM 9.4 09/25/2021   EGFR 90 09/25/2021   GFRNONAA >60 03/31/2021   Lab Results  Component Value Date   CHOL 176 09/25/2021   HDL 84 09/25/2021   LDLCALC 77 09/25/2021   TRIG 85 09/25/2021   CHOLHDL 1.7 11/05/2018   Lab Results  Component Value Date   TSH 1.120 06/01/2017   No results found for: "HGBA1C" Lab Results  Component Value Date   WBC 8.0 09/25/2021   HGB 12.9 09/25/2021   HCT 39.1 09/25/2021   MCV 95 09/25/2021   PLT 289 09/25/2021   Lab Results  Component Value Date   ALT 12 03/30/2021   AST 15 03/30/2021   ALKPHOS 33 (L) 03/30/2021   BILITOT 0.9 03/30/2021   No results found for: "25OHVITD2", "25OHVITD3", "VD25OH"   Review of Systems  Constitutional:  Negative for chills and fever.  HENT:  Positive for congestion and rhinorrhea. Negative for drooling, ear discharge, ear pain, sinus pain, sneezing and sore throat.   Respiratory:  Positive for cough. Negative for shortness of breath and wheezing.   Cardiovascular:  Negative for chest pain, palpitations and leg swelling.   Gastrointestinal:  Negative for abdominal pain, blood in stool, constipation, diarrhea, nausea and vomiting.  Endocrine: Negative for polydipsia.  Genitourinary:  Negative for dysuria, frequency, hematuria and urgency.  Musculoskeletal:  Negative for back pain, joint pain, myalgias and neck pain.  Skin:  Negative for rash.  Allergic/Immunologic: Negative for environmental allergies.  Neurological:  Negative for dizziness and headaches.  Hematological:  Does not bruise/bleed easily.  Psychiatric/Behavioral:  Negative for suicidal ideas. The patient is not nervous/anxious.     Patient Active Problem List   Diagnosis Date Noted   Other synovitis and tenosynovitis, unspecified hand 03/13/2021   Inflammatory pain 03/13/2021   Ganglion cyst of finger of right hand 06/25/2020   Pain in finger of right hand 06/25/2020   Aortic atherosclerosis ([place]) 11/05/2018   COPD, moderate ([place]) 08/23/2015   Asthma, mild intermittent 06/07/2015   Hyperlipidemia 06/07/2015    No Known Allergies  Past Surgical History:  Procedure Laterality Date   ABDOMINAL HYSTERECTOMY     CATARACT EXTRACTION W/PHACO Left 10/09/2021   Procedure: CATARACT EXTRACTION PHACO AND INTRAOCULAR LENS PLACEMENT ([place]) LEFT 8.38 01:09.7;  Surgeon: Leandrew Koyanagi, MD;  Location: [facility];  Service: Ophthalmology;  Laterality: Left;   CATARACT EXTRACTION W/PHACO Right 10/23/2021   Procedure: CATARACT EXTRACTION PHACO AND INTRAOCULAR LENS PLACEMENT ([facility]) RIGHT 5.51 00:48.8;  Surgeon: Leandrew Koyanagi, MD;  Location: [facility];  Service: Ophthalmology;  Laterality: Right;   COLONOSCOPY     TONSILLECTOMY     TUBAL LIGATION      Social History   Tobacco Use   Smoking status: Every Day    Packs/day: 0.25    Years: 50.00    Total pack years: 12.50    Types: Cigarettes    Start date: 02/17/1958   Smokeless tobacco: Never   Tobacco comments:    aware needs to stop   Vaping Use   Vaping Use: Never  used  Substance Use Topics   Alcohol use: Yes    Alcohol/week: 1.0 standard drink of alcohol    Types: 1 Glasses of wine per week   Drug use: No     Medication list has been reviewed and updated.  Current Meds  Medication Sig   aspirin 81 MG tablet Take 81 mg by mouth daily.   budesonide (ENTOCORT EC) 3 MG 24 hr capsule Take 1 capsule by mouth every morning.   calcium carbonate 1250 MG capsule Take 1,250 mg by mouth 2 (two) times daily with a meal.   guaiFENesin-codeine 100-10 MG/5ML syrup Take 10 mLs by mouth 3 (three) times daily as needed for cough.   lovastatin (MEVACOR) 20 MG tablet Take 1 tablet (20 mg total) by mouth daily.   Magnesium 400 MG TABS    Melatonin 10 MG TABS Take 1 tablet by mouth at bedtime.   montelukast (SINGULAIR) 10 MG tablet TAKE (1) TABLET BY MOUTH EVERY DAY   Multiple Vitamins-Minerals (CENTRUM SILVER ADULT 50+ PO) Take 1 capsule by mouth daily at 6 (six) AM.       11/11/2021    9:08 AM 10/28/2021    1:33 PM 09/25/2021    9:21 AM 02/26/2021    9:20 AM  GAD 7 : Generalized Anxiety Score  Nervous, Anxious, on Edge 0 0 0 0  Control/stop worrying 0 0 0 0  Worry too much - different things 0 0 0 0  Trouble relaxing 0 0 0 0  Restless 0 0 0 0  Easily annoyed or irritable 0 0 0 0  Afraid - awful might happen 0 0 0 0  Total GAD 7 Score 0 0 0 0  Anxiety Difficulty Not difficult at all Not difficult at all Not difficult at all Not difficult at all       11/11/2021    9:08 AM 10/28/2021    1:32 PM 09/25/2021    9:21 AM  Depression screen PHQ 2/9  Decreased Interest 0 0 0  Down, Depressed, Hopeless 0 0 0  PHQ - 2 Score 0 0 0  Altered sleeping 0 0 0  Tired, decreased energy 0 0 0  Change in appetite 0 0 0  Feeling bad or failure about yourself  0 0 0  Trouble concentrating 0 0 0  Moving slowly or fidgety/restless 0 0 0  Suicidal thoughts 0 0 0  PHQ-9 Score 0 0 0  Difficult doing work/chores Not difficult at all Not difficult at all Not difficult at all     BP Readings from Last 3 Encounters:  11/11/21 130/76  10/28/21 120/76  10/24/21 (!) 157/80    Physical Exam Vitals and nursing note reviewed. Exam conducted with a chaperone present.  Constitutional:      General: She is not in acute distress.    Appearance: She is not diaphoretic.  HENT:     Head: Normocephalic and atraumatic.     Right Ear: Tympanic membrane and external ear  normal.     Left Ear: Tympanic membrane and external ear normal.     Nose: Nose normal.     Right Turbinates: Swollen.     Left Turbinates: Swollen.     Right Sinus: No maxillary sinus tenderness or frontal sinus tenderness.     Left Sinus: No maxillary sinus tenderness or frontal sinus tenderness.  Eyes:     General:        Right eye: No discharge.        Left eye: No discharge.     Conjunctiva/sclera: Conjunctivae normal.     Pupils: Pupils are equal, round, and reactive to light.  Neck:     Thyroid: No thyromegaly.     Vascular: No JVD.  Cardiovascular:     Rate and Rhythm: Normal rate and regular rhythm.     Heart sounds: Normal heart sounds. No murmur heard.    No friction rub. No gallop.  Pulmonary:     Effort: Pulmonary effort is normal.     Breath sounds: Normal breath sounds. No decreased breath sounds, wheezing, rhonchi or rales.  Abdominal:     General: Bowel sounds are normal.     Palpations: Abdomen is soft. There is no mass.     Tenderness: There is no abdominal tenderness. There is no guarding.  Musculoskeletal:        General: Normal range of motion.     Cervical back: Normal range of motion and neck supple.  Lymphadenopathy:     Cervical: No cervical adenopathy.  Skin:    General: Skin is warm and dry.  Neurological:     Mental Status: She is alert.     Wt Readings from Last 3 Encounters:  11/11/21 152 lb (68.9 kg)  10/28/21 153 lb (69.4 kg)  10/24/21 154 lb (69.9 kg)    BP 130/76   Pulse 64   Ht 5' 7" (1.702 m)   Wt 152 lb (68.9 kg)   BMI 23.81 kg/m    Assessment and Plan: 1. Seasonal allergic rhinitis due to pollen Chronic.  Uncontrolled.  Stable.  Patient has mucus drainage status post anterior nasal and position.  It is currently uncontrolled on Singulair 10 mg.  I have suggested for the time being and adding Claritin for antihistamine and initiating Astelin nasal spray which is an antihistamine to counter dry up the rhinorrhea.  We are awaiting the first frost and this probably when we finally have resolution of this issue. - azelastine (ASTELIN) 0.1 % nasal spray; Place 2 sprays into both nostrils 2 (two) times daily. Use in each nostril as directed  Dispense: 30 mL; Refill: 12  2. Need for immunization against influenza Discussed and administered - Flu Vaccine QUAD High Dose(Fluad)    Otilio Miu, MD

## 2021-11-13 ENCOUNTER — Other Ambulatory Visit: Payer: Self-pay | Admitting: Family Medicine

## 2021-11-13 DIAGNOSIS — E785 Hyperlipidemia, unspecified: Secondary | ICD-10-CM

## 2021-11-13 NOTE — Telephone Encounter (Signed)
Last refill 08/13/21. Requested Prescriptions  Pending Prescriptions Disp Refills  . lovastatin (MEVACOR) 20 MG tablet [Pharmacy Med Name: LOVASTATIN 20 MG TAB] 90 tablet 0    Sig: TAKE (1) TABLET BY MOUTH EVERY DAY     Cardiovascular:  Antilipid - Statins 2 Failed - 11/13/2021 12:41 PM      Failed - Lipid Panel in normal range within the last 12 months    Cholesterol, Total  Date Value Ref Range Status  09/25/2021 176 100 - 199 mg/dL Final   LDL Chol Calc (NI[facility])  Date Value Ref Range Status  09/25/2021 77 0 - 99 mg/dL Final   HDL  Date Value Ref Range Status  09/25/2021 84 >39 mg/dL Final   Triglycerides  Date Value Ref Range Status  09/25/2021 85 0 - 149 mg/dL Final         Passed - Cr in normal range and within 360 days    Creatinine, Ser  Date Value Ref Range Status  09/25/2021 0.68 0.57 - 1.00 mg/dL Final         Passed - Patient is not pregnant      Passed - Valid encounter within last 12 months    Recent Outpatient Visits          2 days ago Seasonal allergic rhinitis due to pollen   [facility] at [place], Deanna C, MD   2 weeks ago Acute maxillary sinusitis, recurrence not specified   [place] [facility] at [place], Deanna C, MD   1 month ago Hyperlipidemia, unspecified hyperlipidemia type   [facility] at [place], Deanna C, MD   7 months ago Diverticulitis   [facility] at [place], [place], MD   8 months ago Hyperlipidemia, unspecified hyperlipidemia type   [facility] at [place], [place], MD      Future Appointments            In 4 months Juline Patch, MD [facility] at [facility] [facility], [facility]

## 2021-12-16 DIAGNOSIS — Z4789 Encounter for other orthopedic aftercare: Secondary | ICD-10-CM | POA: Diagnosis not present

## 2022-01-05 ENCOUNTER — Ambulatory Visit
Admission: RE | Admit: 2022-01-05 | Discharge: 2022-01-05 | Disposition: A | Discharge status: Home / Self Care | Payer: PPO | Source: Ambulatory Visit | Attending: Emergency Medicine | Admitting: Emergency Medicine

## 2022-01-05 ENCOUNTER — Encounter: Payer: Self-pay | Admitting: Gastroenterology

## 2022-01-05 ENCOUNTER — Ambulatory Visit (INDEPENDENT_AMBULATORY_CARE_PROVIDER_SITE_OTHER): Payer: PPO

## 2022-01-05 VITALS — BP 111/76 | HR 74 | Temp 98.8°F | Resp 16

## 2022-01-05 DIAGNOSIS — Z79899 Other long term (current) drug therapy: Secondary | ICD-10-CM | POA: Insufficient documentation

## 2022-01-05 DIAGNOSIS — R079 Chest pain, unspecified: Secondary | ICD-10-CM | POA: Insufficient documentation

## 2022-01-05 DIAGNOSIS — R509 Fever, unspecified: Secondary | ICD-10-CM

## 2022-01-05 DIAGNOSIS — J189 Pneumonia, unspecified organism: Secondary | ICD-10-CM

## 2022-01-05 DIAGNOSIS — Z7952 Long term (current) use of systemic steroids: Secondary | ICD-10-CM | POA: Insufficient documentation

## 2022-01-05 DIAGNOSIS — J188 Other pneumonia, unspecified organism: Secondary | ICD-10-CM | POA: Diagnosis not present

## 2022-01-05 DIAGNOSIS — R059 Cough, unspecified: Secondary | ICD-10-CM | POA: Diagnosis not present

## 2022-01-05 DIAGNOSIS — J441 Chronic obstructive pulmonary disease with (acute) exacerbation: Secondary | ICD-10-CM | POA: Insufficient documentation

## 2022-01-05 DIAGNOSIS — Z792 Long term (current) use of antibiotics: Secondary | ICD-10-CM | POA: Insufficient documentation

## 2022-01-05 DIAGNOSIS — Z1152 Encounter for screening for COVID-19: Secondary | ICD-10-CM | POA: Insufficient documentation

## 2022-01-05 LAB — RESP PANEL BY RT-PCR (RSV, FLU A&B, COVID)  RVPGX2
Influenza A by PCR: NEGATIVE
Influenza B by PCR: NEGATIVE
Resp Syncytial Virus by PCR: NEGATIVE
SARS Coronavirus 2 by RT PCR: NEGATIVE

## 2022-01-05 MED ORDER — PREDNISONE 20 MG PO TABS: 40.0000 mg | ORAL_TABLET | Freq: Every day | ORAL | 0 refills | Status: AC

## 2022-01-05 MED ORDER — AZITHROMYCIN 250 MG PO TABS: 250.0000 mg | ORAL_TABLET | Freq: Every day | ORAL | 0 refills | Status: DC

## 2022-01-05 MED ORDER — AMOXICILLIN-POT CLAVULANATE 875-125 MG PO TABS: 1.0000 | ORAL_TABLET | Freq: Two times a day (BID) | ORAL | 0 refills | Status: DC

## 2022-01-05 MED ORDER — ALBUTEROL SULFATE HFA 108 (90 BASE) MCG/ACT IN AERS: 1.0000 | INHALATION_SPRAY | RESPIRATORY_TRACT | 0 refills | Status: DC | PRN

## 2022-01-05 MED ORDER — AEROCHAMBER MV MISC: 1 refills | Status: DC

## 2022-01-05 NOTE — Discharge Instructions (Signed)
I will contact you if and only if your COVID, influenza or RSV come back positive and we will discuss neck steps.  In the meantime, your chest x-ray is concerning for right middle lobe pneumonia I am concerned that you could be having a COPD exacerbation.  Finish the Augmentin and the azithromycin, even if you feel better.  2 puffs from your albuterol inhaler using your spacer every 4 hours for 2 days, then every 6 hours for 2 days, then as needed.  You can back off on the albuterol if you start to improve sooner.  Prednisone will help with a COPD exacerbation.  Go to the ER if you get worse, need your albuterol every hour, continue having fevers despite being on the antibiotics for 48 hours, or for other concerns

## 2022-01-05 NOTE — ED Provider Notes (Signed)
HPI  SUBJECTIVE:  Caroline Harris is a 76 y.o. female who presents with 2 days of fevers Tmax 100, cough productive of green sputum with an increased amount of sputum, right-sided sharp chest pain that is worse with coughing and lying on her right side, malaise, body aches, headaches.  No nasal congestion, rhinorrhea, sinus pain or pressure, facial swelling, upper dental pain, postnasal drip, sore throat, loss of vision, taste changes, shortness of breath, dyspnea on exertion, nausea, vomiting, diarrhea, abdominal pain.  She had negative home COVID test.  No known COVID, flu, RSV exposure.  She got 5 doses of the COVID-vaccine in this years flu vaccine.  She has been taking Tylenol 1000 mg with improvement in her symptoms.  She is not using her albuterol inhaler.  No aggravating factors.  She is able to sleep at night without waking up coughing.  She was treated with azithromycin on 9/11 for sinusitis.  She has a past medical history of asthma/COPD, collagenous colitis.  PCP: [facility].  Past Medical History:  Diagnosis Date   Arthritis    In fingers   Cataract March, 2023   Will have removed   Colitis    Collagenous colitis    Diverticulitis    Diverticulitis 03/29/2021   Febrile seizure ([place])    as infant   Hyperlipidemia    Osteopenia    Restless leg    Wears dentures    partial lower    Past Surgical History:  Procedure Laterality Date   ABDOMINAL HYSTERECTOMY     CATARACT EXTRACTION W/PHACO Left 10/09/2021   Procedure: CATARACT EXTRACTION PHACO AND INTRAOCULAR LENS PLACEMENT ([place]) LEFT 8.38 01:09.7;  Surgeon: Leandrew Koyanagi, MD;  Location: [facility];  Service: Ophthalmology;  Laterality: Left;   CATARACT EXTRACTION W/PHACO Right 10/23/2021   Procedure: CATARACT EXTRACTION PHACO AND INTRAOCULAR LENS PLACEMENT ([facility]) RIGHT 5.51 00:48.8;  Surgeon: Leandrew Koyanagi, MD;  Location: [facility];  Service: Ophthalmology;  Laterality: Right;   COLONOSCOPY      TONSILLECTOMY     TUBAL LIGATION      Family History  Problem Relation Age of Onset   Breast cancer Mother 74   Cancer Mother    Miscarriages / [place] Mother    Heart attack Father    Heart disease Father     Social History   Tobacco Use   Smoking status: Every Day    Packs/day: 0.25    Years: 50.00    Total pack years: 12.50    Types: Cigarettes    Start date: 02/17/1958   Smokeless tobacco: Never   Tobacco comments:    aware needs to stop   Vaping Use   Vaping Use: Never used  Substance Use Topics   Alcohol use: Yes    Alcohol/week: 1.0 standard drink of alcohol    Types: 1 Glasses of wine per week   Drug use: No    No current facility-administered medications for this encounter.  Current Outpatient Medications:    albuterol (VENTOLIN HFA) 108 (90 Base) MCG/ACT inhaler, Inhale 1-2 puffs into the lungs every 4 (four) hours as needed for wheezing or shortness of breath., Disp: 1 each, Rfl: 0   amoxicillin-clavulanate (AUGMENTIN) 875-125 MG tablet, Take 1 tablet by mouth every 12 (twelve) hours., Disp: 14 tablet, Rfl: 0   azithromycin (ZITHROMAX) 250 MG tablet, Take 1 tablet (250 mg total) by mouth daily. 2 tabs po on day 1, 1 tab po on days 2-5, Disp: 6 tablet, Rfl:  0   predniSONE (DELTASONE) 20 MG tablet, Take 2 tablets (40 mg total) by mouth daily with breakfast for 5 days., Disp: 10 tablet, Rfl: 0   Spacer/Aero-Holding Chambers (AEROCHAMBER MV) inhaler, Use as instructed, Disp: 1 each, Rfl: 1   aspirin 81 MG tablet, Take 81 mg by mouth daily., Disp: , Rfl:    azelastine (ASTELIN) 0.1 % nasal spray, Place 2 sprays into both nostrils 2 (two) times daily. Use in each nostril as directed, Disp: 30 mL, Rfl: 12   budesonide (ENTOCORT EC) 3 MG 24 hr capsule, Take 1 capsule by mouth every morning., Disp: , Rfl:    calcium carbonate 1250 MG capsule, Take 1,250 mg by mouth 2 (two) times daily with a meal., Disp: , Rfl:    guaiFENesin-codeine 100-10 MG/5ML syrup, Take 10  mLs by mouth 3 (three) times daily as needed for cough., Disp: 120 mL, Rfl: 0   lovastatin (MEVACOR) 20 MG tablet, TAKE (1) TABLET BY MOUTH EVERY DAY, Disp: 90 tablet, Rfl: 0   Magnesium 400 MG TABS, , Disp: , Rfl:    Melatonin 10 MG TABS, Take 1 tablet by mouth at bedtime., Disp: , Rfl:    montelukast (SINGULAIR) 10 MG tablet, TAKE (1) TABLET BY MOUTH EVERY DAY, Disp: 90 tablet, Rfl: 0   Multiple Vitamins-Minerals (CENTRUM SILVER ADULT 50+ PO), Take 1 capsule by mouth daily at 6 (six) AM., Disp: , Rfl:   No Known Allergies   ROS  As noted in HPI.   Physical Exam  BP 111/76 (BP Location: Left Arm)   Pulse 74   Temp 98.8 F (37.1 C) (Oral)   Resp 16   SpO2 95%   Constitutional: Well developed, well nourished, no acute distress Eyes:  EOMI, conjunctiva normal bilaterally HENT: Normocephalic, atraumatic,mucus membranes moist.  No nasal congestion.  No postnasal drip. Neck: No cervical lymphadenopathy Respiratory: Normal inspiratory effort, lungs clear bilaterally.  No chest wall tenderness Cardiovascular: Normal rate, regular rhythm, no murmurs, rubs, gallops GI: nondistended skin: No rash, skin intact Musculoskeletal: no deformities Neurologic: Alert & oriented x 3, no focal neuro deficits Psychiatric: Speech and behavior appropriate   ED Course   Medications - No data to display  Orders Placed This Encounter  Procedures   Resp panel by RT-PCR (RSV, Flu A&B, Covid) Anterior Nasal Swab    Standing Status:   Standing    Number of Occurrences:   1    Order Specific Question:   Patient immune status    Answer:   Normal   DG Chest 2 View    Standing Status:   Standing    Number of Occurrences:   1    Order Specific Question:   Reason for Exam (SYMPTOM  OR DIAGNOSIS REQUIRED)    Answer:   Fever, productive cough, right-sided chest pain rule out pneumonia    No results found for this or any previous visit (from the past 24 hour(s)).  DG Chest 2 View  Result Date:  01/05/2022 CLINICAL DATA:  Fever, productive cough, right-sided chest pain rule out pneumonia EXAM: CHEST - 2 VIEW COMPARISON:  October 20, 2019 FINDINGS: The cardiomediastinal silhouette is unchanged in contour. Atherosclerotic calcifications of the tortuous thoracic aorta. No pleural effusion. No pneumothorax. Mildly increased reticular nodularity of the RIGHT mid lung. Visualized abdomen is unremarkable. Degenerative changes of the LEFT shoulder. IMPRESSION: Mildly increased reticular nodularity of the RIGHT mid lung, nonspecific but could reflect developing infection. Recommend follow-up PA and lateral chest radiograph in  4-6 weeks to ensure resolution. Electronically Signed   By: Valentino Saxon M.D.   On: 01/05/2022 13:37   Results for orders placed or performed during the hospital encounter of 01/05/22  Resp panel by RT-PCR (RSV, Flu A&B, Covid) Anterior Nasal Swab   Specimen: Anterior Nasal Swab  Result Value Ref Range   SARS Coronavirus 2 by RT PCR NEGATIVE NEGATIVE   Influenza A by PCR NEGATIVE NEGATIVE   Influenza B by PCR NEGATIVE NEGATIVE   Resp Syncytial Virus by PCR NEGATIVE NEGATIVE     ED Clinical Impression  1. Community acquired pneumonia of right middle lobe of lung   2. COPD exacerbation [facility])      ED Assessment/Plan     Checking COVID, flu, RSV, chest x-ray.  If all negative, will treat as a COPD exacerbation given the increased amount of and change in color sputum that she is reporting.  Home with Augmentin, prednisone 40 mg for 5 days, regularly scheduled albuterol inhaler with a spacer for 4 days, then as needed thereafter.  Advised probiotics while taking the antibiotics.  Follow-up with PCP as needed.  ER return precautions given  Reviewed imaging independently.  Mildly increased reticular nodularity in the right midlung that could be developing infection.  Recommend follow-up chest x-ray in 4 to 6 weeks to ensure resolution.  See radiology report for full  details.  Presentation concerning for right middle lung pneumonia and COPD exacerbation.  Will send home with Augmentin for 7 days to cover both pneumonia and COPD exacerbation plus azithromycin Z-Pak for 5 days per up-to-date guidelines.  Regularly scheduled albuterol inhaler with a spacer, prednisone 40 mg for 5 days for COPD exacerbation.  Will call patient if COVID or flu, RSV are positive.  COVID, influenza, RSV negative.  Plan as above.  Discussed labs, imaging, MDM, treatment plan, and plan for follow-up with patient. Discussed sn/sx that should prompt return to the ED. patient agrees with plan.   Meds ordered this encounter  Medications   Spacer/Aero-Holding Chambers (AEROCHAMBER MV) inhaler    Sig: Use as instructed    Dispense:  1 each    Refill:  1   albuterol (VENTOLIN HFA) 108 (90 Base) MCG/ACT inhaler    Sig: Inhale 1-2 puffs into the lungs every 4 (four) hours as needed for wheezing or shortness of breath.    Dispense:  1 each    Refill:  0   amoxicillin-clavulanate (AUGMENTIN) 875-125 MG tablet    Sig: Take 1 tablet by mouth every 12 (twelve) hours.    Dispense:  14 tablet    Refill:  0   predniSONE (DELTASONE) 20 MG tablet    Sig: Take 2 tablets (40 mg total) by mouth daily with breakfast for 5 days.    Dispense:  10 tablet    Refill:  0   azithromycin (ZITHROMAX) 250 MG tablet    Sig: Take 1 tablet (250 mg total) by mouth daily. 2 tabs po on day 1, 1 tab po on days 2-5    Dispense:  6 tablet    Refill:  0      *This clinic note was created using [employer]. Therefore, there may be occasional mistakes despite careful proofreading.  ?    Melynda Ripple, MD 01/07/22 [phone]

## 2022-01-05 NOTE — ED Triage Notes (Signed)
Patient presents to UC for fever, chills, productive cough, chest discomfort when she coughs. Symptom onset Friday evening. Treating symptoms with tylenol.

## 2022-01-15 ENCOUNTER — Encounter: Payer: Self-pay | Admitting: Family Medicine

## 2022-02-04 ENCOUNTER — Ambulatory Visit
Admission: RE | Admit: 2022-02-04 | Discharge: 2022-02-04 | Disposition: A | Discharge status: Home / Self Care | Payer: PPO | Attending: Internal Medicine | Admitting: Internal Medicine

## 2022-02-04 ENCOUNTER — Encounter: Payer: Self-pay | Admitting: Family Medicine

## 2022-02-04 ENCOUNTER — Ambulatory Visit
Admission: RE | Admit: 2022-02-04 | Discharge: 2022-02-04 | Disposition: A | Discharge status: Home / Self Care | Payer: PPO | Source: Ambulatory Visit | Attending: Family Medicine | Admitting: Family Medicine

## 2022-02-04 ENCOUNTER — Ambulatory Visit (INDEPENDENT_AMBULATORY_CARE_PROVIDER_SITE_OTHER): Payer: PPO | Admitting: Family Medicine

## 2022-02-04 VITALS — BP 116/72 | HR 58 | Ht 67.0 in | Wt 156.0 lb

## 2022-02-04 DIAGNOSIS — J4520 Mild intermittent asthma, uncomplicated: Secondary | ICD-10-CM | POA: Diagnosis not present

## 2022-02-04 DIAGNOSIS — J439 Emphysema, unspecified: Secondary | ICD-10-CM | POA: Diagnosis not present

## 2022-02-04 DIAGNOSIS — R9389 Abnormal findings on diagnostic imaging of other specified body structures: Secondary | ICD-10-CM | POA: Diagnosis not present

## 2022-02-04 DIAGNOSIS — J189 Pneumonia, unspecified organism: Secondary | ICD-10-CM | POA: Diagnosis not present

## 2022-02-04 NOTE — Progress Notes (Signed)
Date:  02/04/2022   Name:  Caroline Harris   DOB:  1945-04-17   MRN:  [id number]   Chief Complaint: Pneumonia (Pt feels well )  Pneumonia There is no chest tightness, cough, difficulty breathing, frequent throat clearing, hemoptysis, hoarse voice, shortness of breath, sputum production or wheezing. This is a chronic problem. The current episode started more than 1 month ago. The problem occurs intermittently. The problem has been gradually improving. Pertinent negatives include no appetite change, chest pain, dyspnea on exertion, ear congestion, ear pain, fever, headaches, heartburn, malaise/fatigue, myalgias, nasal congestion, orthopnea, PND, postnasal drip, rhinorrhea, sneezing or sore throat. Her symptoms are aggravated by nothing. Her symptoms are alleviated by nothing.    Lab Results  Component Value Date   NA 139 09/25/2021   K 4.5 09/25/2021   CO2 24 09/25/2021   GLUCOSE 88 09/25/2021   BUN 13 09/25/2021   CREATININE 0.68 09/25/2021   CALCIUM 9.4 09/25/2021   EGFR 90 09/25/2021   GFRNONAA >60 03/31/2021   Lab Results  Component Value Date   CHOL 176 09/25/2021   HDL 84 09/25/2021   LDLCALC 77 09/25/2021   TRIG 85 09/25/2021   CHOLHDL 1.7 11/05/2018   Lab Results  Component Value Date   TSH 1.120 06/01/2017   No results found for: "HGBA1C" Lab Results  Component Value Date   WBC 8.0 09/25/2021   HGB 12.9 09/25/2021   HCT 39.1 09/25/2021   MCV 95 09/25/2021   PLT 289 09/25/2021   Lab Results  Component Value Date   ALT 12 03/30/2021   AST 15 03/30/2021   ALKPHOS 33 (L) 03/30/2021   BILITOT 0.9 03/30/2021   No results found for: "25OHVITD2", "25OHVITD3", "VD25OH"   Review of Systems  Constitutional: Negative.  Negative for appetite change, chills, fatigue, fever, malaise/fatigue and unexpected weight change.  HENT:  Negative for congestion, ear discharge, ear pain, hoarse voice, postnasal drip, rhinorrhea, sinus pressure, sneezing and sore throat.    Respiratory:  Negative for cough, hemoptysis, sputum production, shortness of breath, wheezing and stridor.   Cardiovascular:  Negative for chest pain, dyspnea on exertion and PND.  Gastrointestinal:  Negative for abdominal pain, blood in stool, constipation, diarrhea, heartburn and nausea.  Genitourinary:  Negative for dysuria, flank pain, frequency, hematuria, urgency and vaginal discharge.  Musculoskeletal:  Negative for arthralgias, back pain and myalgias.  Skin:  Negative for rash.  Neurological:  Negative for dizziness, weakness and headaches.  Hematological:  Negative for adenopathy. Does not bruise/bleed easily.  Psychiatric/Behavioral:  Negative for dysphoric mood. The patient is not nervous/anxious.     Patient Active Problem List   Diagnosis Date Noted   Other synovitis and tenosynovitis, unspecified hand 03/13/2021   Inflammatory pain 03/13/2021   Ganglion cyst of finger of right hand 06/25/2020   Pain in finger of right hand 06/25/2020   Aortic atherosclerosis ([place]) 11/05/2018   COPD, moderate ([place]) 08/23/2015   Asthma, mild intermittent 06/07/2015   Hyperlipidemia 06/07/2015    No Known Allergies  Past Surgical History:  Procedure Laterality Date   ABDOMINAL HYSTERECTOMY     CATARACT EXTRACTION W/PHACO Left 10/09/2021   Procedure: CATARACT EXTRACTION PHACO AND INTRAOCULAR LENS PLACEMENT ([place]) LEFT 8.38 01:09.7;  Surgeon: Leandrew Koyanagi, MD;  Location: [place];  Service: Ophthalmology;  Laterality: Left;   CATARACT EXTRACTION W/PHACO Right 10/23/2021   Procedure: CATARACT EXTRACTION PHACO AND INTRAOCULAR LENS PLACEMENT ([facility]) RIGHT 5.51 00:48.8;  Surgeon: Leandrew Koyanagi, MD;  Location: [facility];  Service: Ophthalmology;  Laterality: Right;   COLONOSCOPY     TONSILLECTOMY     TUBAL LIGATION      Social History   Tobacco Use   Smoking status: Every Day    Packs/day: 0.25    Years: 50.00    Total pack years: 12.50    Types:  Cigarettes    Start date: 02/17/1958   Smokeless tobacco: Never   Tobacco comments:    aware needs to stop   Vaping Use   Vaping Use: Never used  Substance Use Topics   Alcohol use: Yes    Alcohol/week: 1.0 standard drink of alcohol    Types: 1 Glasses of wine per week   Drug use: No     Medication list has been reviewed and updated.  Current Meds  Medication Sig   aspirin 81 MG tablet Take 81 mg by mouth daily.   budesonide (ENTOCORT EC) 3 MG 24 hr capsule Take 1 capsule by mouth every morning.   calcium carbonate 1250 MG capsule Take 1,250 mg by mouth 2 (two) times daily with a meal.   lovastatin (MEVACOR) 20 MG tablet TAKE (1) TABLET BY MOUTH EVERY DAY   Magnesium 400 MG TABS    Melatonin 10 MG TABS Take 1 tablet by mouth at bedtime.   montelukast (SINGULAIR) 10 MG tablet TAKE (1) TABLET BY MOUTH EVERY DAY   Multiple Vitamins-Minerals (CENTRUM SILVER ADULT 50+ PO) Take 1 capsule by mouth daily at 6 (six) AM.       02/04/2022    2:40 PM 11/11/2021    9:08 AM 10/28/2021    1:33 PM 09/25/2021    9:21 AM  GAD 7 : Generalized Anxiety Score  Nervous, Anxious, on Edge 0 0 0 0  Control/stop worrying 0 0 0 0  Worry too much - different things 0 0 0 0  Trouble relaxing 0 0 0 0  Restless 0 0 0 0  Easily annoyed or irritable 0 0 0 0  Afraid - awful might happen 0 0 0 0  Total GAD 7 Score 0 0 0 0  Anxiety Difficulty Not difficult at all Not difficult at all Not difficult at all Not difficult at all       02/04/2022    2:40 PM 11/11/2021    9:08 AM 10/28/2021    1:32 PM  Depression screen PHQ 2/9  Decreased Interest 0 0 0  Down, Depressed, Hopeless 0 0 0  PHQ - 2 Score 0 0 0  Altered sleeping 0 0 0  Tired, decreased energy 0 0 0  Change in appetite 0 0 0  Feeling bad or failure about yourself  0 0 0  Trouble concentrating 0 0 0  Moving slowly or fidgety/restless 0 0 0  Suicidal thoughts 0 0 0  PHQ-9 Score 0 0 0  Difficult doing work/chores Not difficult at all Not  difficult at all Not difficult at all    BP Readings from Last 3 Encounters:  02/04/22 116/72  01/05/22 111/76  11/11/21 130/76    Physical Exam Vitals and nursing note reviewed. Exam conducted with a chaperone present.  Constitutional:      General: She is not in acute distress.    Appearance: She is not diaphoretic.  HENT:     Head: Normocephalic and atraumatic.     Right Ear: Tympanic membrane and external ear normal.     Left Ear: Tympanic membrane and external ear normal.  Nose: Nose normal.     Mouth/Throat:     Mouth: Mucous membranes are moist.  Eyes:     General:        Right eye: No discharge.        Left eye: No discharge.     Conjunctiva/sclera: Conjunctivae normal.     Pupils: Pupils are equal, round, and reactive to light.  Neck:     Thyroid: No thyromegaly.     Vascular: No JVD.  Cardiovascular:     Rate and Rhythm: Normal rate and regular rhythm.     Heart sounds: Normal heart sounds. No murmur heard.    No friction rub. No gallop.  Pulmonary:     Effort: Pulmonary effort is normal.     Breath sounds: Normal breath sounds. No wheezing, rhonchi or rales.  Abdominal:     General: Bowel sounds are normal.     Palpations: Abdomen is soft. There is no mass.     Tenderness: There is no abdominal tenderness. There is no guarding.  Musculoskeletal:        General: Normal range of motion.     Cervical back: Normal range of motion and neck supple.  Lymphadenopathy:     Cervical: No cervical adenopathy.  Skin:    General: Skin is warm and dry.  Neurological:     Mental Status: She is alert.     Deep Tendon Reflexes: Reflexes are normal and symmetric.     Wt Readings from Last 3 Encounters:  02/04/22 156 lb (70.8 kg)  11/11/21 152 lb (68.9 kg)  10/28/21 153 lb (69.4 kg)    BP 116/72   Pulse (!) 58   Ht _0  (1.702 m)   Wt 156 lb (70.8 kg)   SpO2 96%   BMI 24.43 kg/m   Assessment and Plan:  1. Mild intermittent asthma, unspecified whether  complicated Chronic.  Controlled.  Stable.  Not using or having to use her inhalers.  Currently patient feels well with resolution of cough and respiratory concerns from previous evaluation in urgent care.  Percussion and auscultation of the lungs are normal. - DG Chest 2 View  2. Abnormal chest x-ray There is an area of possible increased reticular nodularity of the right midlung which was read as nonspecific but we have repeating the chest x-ray at this time to see if there is resolution status post antibiotic treatment. - DG Chest 2 View    Otilio Miu, MD

## 2022-02-28 ENCOUNTER — Other Ambulatory Visit: Payer: Self-pay | Admitting: Family Medicine

## 2022-02-28 DIAGNOSIS — J4520 Mild intermittent asthma, uncomplicated: Secondary | ICD-10-CM

## 2022-02-28 NOTE — Telephone Encounter (Signed)
Requested Prescriptions  Pending Prescriptions Disp Refills   montelukast (SINGULAIR) 10 MG tablet [Pharmacy Med Name: MONTELUKAST SODIUM 10 MG TAB] 90 tablet 0    Sig: TAKE (1) TABLET BY MOUTH EVERY DAY     Pulmonology:  Leukotriene Inhibitors Passed - 02/28/2022 11:50 AM      Passed - Valid encounter within last 12 months    Recent Outpatient Visits           3 weeks ago Mild intermittent asthma, unspecified whether complicated   [facility] [place], Deanna C, MD   3 months ago Seasonal allergic rhinitis due to pollen   [facility] [place], Deanna C, MD   4 months ago Acute maxillary sinusitis, recurrence not specified   [facility] [place], [place], MD   5 months ago Hyperlipidemia, unspecified hyperlipidemia type   [facility] at [place], Deanna C, MD   10 months ago Diverticulitis   [facility] at [place], [place], MD       Future Appointments             In 4 weeks Juline Patch, MD [facility] at [facility], [facility]

## 2022-03-18 ENCOUNTER — Telehealth: Payer: Self-pay | Admitting: Gastroenterology

## 2022-03-18 NOTE — Telephone Encounter (Signed)
Pharmacy called needing diagnosis code for [place] '3mg'$  written 06/05/2021.

## 2022-03-18 NOTE — Telephone Encounter (Signed)
Called the pharmacy back and gave the pharmacy the diagnosis code for the medication

## 2022-03-25 DIAGNOSIS — Z1231 Encounter for screening mammogram for malignant neoplasm of breast: Secondary | ICD-10-CM | POA: Diagnosis not present

## 2022-03-25 LAB — HM MAMMOGRAPHY

## 2022-03-26 ENCOUNTER — Encounter: Payer: Self-pay | Admitting: Family Medicine

## 2022-03-28 ENCOUNTER — Encounter: Payer: Self-pay | Admitting: Family Medicine

## 2022-03-28 ENCOUNTER — Ambulatory Visit (INDEPENDENT_AMBULATORY_CARE_PROVIDER_SITE_OTHER): Payer: Medicare HMO | Admitting: Family Medicine

## 2022-03-28 VITALS — BP 122/76 | HR 64 | Ht 67.0 in | Wt 155.0 lb

## 2022-03-28 DIAGNOSIS — J0100 Acute maxillary sinusitis, unspecified: Secondary | ICD-10-CM | POA: Diagnosis not present

## 2022-03-28 DIAGNOSIS — E785 Hyperlipidemia, unspecified: Secondary | ICD-10-CM | POA: Diagnosis not present

## 2022-03-28 DIAGNOSIS — J432 Centrilobular emphysema: Secondary | ICD-10-CM

## 2022-03-28 DIAGNOSIS — J4520 Mild intermittent asthma, uncomplicated: Secondary | ICD-10-CM | POA: Diagnosis not present

## 2022-03-28 MED ORDER — LOVASTATIN 20 MG PO TABS: ORAL_TABLET | ORAL | 1 refills | Status: DC

## 2022-03-28 MED ORDER — MONTELUKAST SODIUM 10 MG PO TABS: ORAL_TABLET | ORAL | 1 refills | Status: DC

## 2022-03-28 MED ORDER — AZITHROMYCIN 250 MG PO TABS: ORAL_TABLET | ORAL | 0 refills | Status: AC

## 2022-03-28 MED ORDER — BUDESONIDE-FORMOTEROL FUMARATE 160-4.5 MCG/ACT IN AERO: 2.0000 | INHALATION_SPRAY | Freq: Two times a day (BID) | RESPIRATORY_TRACT | 3 refills | Status: DC

## 2022-03-28 NOTE — Progress Notes (Signed)
Date:  03/28/2022   Name:  Caroline Harris   DOB:  [birth year]   MRN:  [id number]   Chief Complaint: Allergic Rhinitis  and Hyperlipidemia  Hyperlipidemia This is a chronic problem. The current episode started more than 1 year ago. The problem is controlled. Recent lipid tests were reviewed and are normal. She has no history of chronic renal disease, diabetes, hypothyroidism, liver disease, obesity or nephrotic syndrome. Pertinent negatives include no chest pain, focal sensory loss, focal weakness, leg pain, myalgias or shortness of breath. Current antihyperlipidemic treatment includes statins. The current treatment provides moderate improvement of lipids.  Sinusitis This is a new problem. The current episode started in the past 7 days. There has been no fever. Associated symptoms include congestion, coughing and sinus pressure. Pertinent negatives include no chills, ear pain, headaches, hoarse voice, shortness of breath, sneezing or sore throat. Past treatments include nothing. The treatment provided moderate relief.    Lab Results  Component Value Date   NA 139 09/25/2021   K 4.5 09/25/2021   CO2 24 09/25/2021   GLUCOSE 88 09/25/2021   BUN 13 09/25/2021   CREATININE 0.68 09/25/2021   CALCIUM 9.4 09/25/2021   EGFR 90 09/25/2021   GFRNONAA >60 03/31/2021   Lab Results  Component Value Date   CHOL 176 09/25/2021   HDL 84 09/25/2021   LDLCALC 77 09/25/2021   TRIG 85 09/25/2021   CHOLHDL 1.7 11/05/2018   Lab Results  Component Value Date   TSH 1.120 06/01/2017   No results found for: "HGBA1C" Lab Results  Component Value Date   WBC 8.0 09/25/2021   HGB 12.9 09/25/2021   HCT 39.1 09/25/2021   MCV 95 09/25/2021   PLT 289 09/25/2021   Lab Results  Component Value Date   ALT 12 03/30/2021   AST 15 03/30/2021   ALKPHOS 33 (L) 03/30/2021   BILITOT 0.9 03/30/2021   No results found for: "25OHVITD2", "25OHVITD3", "VD25OH"   Review of Systems  Constitutional: Negative.   Negative for chills, fatigue, fever and unexpected weight change.  HENT:  Positive for congestion and sinus pressure. Negative for ear discharge, ear pain, hoarse voice, rhinorrhea, sneezing and sore throat.   Respiratory:  Positive for cough. Negative for shortness of breath, wheezing and stridor.   Cardiovascular:  Negative for chest pain.  Gastrointestinal:  Negative for abdominal pain, blood in stool, constipation, diarrhea and nausea.  Genitourinary:  Negative for dysuria, flank pain, frequency, hematuria, urgency and vaginal discharge.  Musculoskeletal:  Negative for arthralgias, back pain and myalgias.  Skin:  Negative for rash.  Neurological:  Negative for dizziness, focal weakness, weakness and headaches.  Hematological:  Negative for adenopathy. Does not bruise/bleed easily.  Psychiatric/Behavioral:  Negative for dysphoric mood. The patient is not nervous/anxious.     Patient Active Problem List   Diagnosis Date Noted   Other synovitis and tenosynovitis, unspecified hand 03/13/2021   Inflammatory pain 03/13/2021   Ganglion cyst of finger of right hand 06/25/2020   Pain in finger of right hand 06/25/2020   Aortic atherosclerosis ([place]) 11/05/2018   COPD, moderate ([place]) 08/23/2015   Asthma, mild intermittent 06/07/2015   Hyperlipidemia 06/07/2015    No Known Allergies  Past Surgical History:  Procedure Laterality Date   ABDOMINAL HYSTERECTOMY     CATARACT EXTRACTION W/PHACO Left 10/09/2021   Procedure: CATARACT EXTRACTION PHACO AND INTRAOCULAR LENS PLACEMENT ([place]) LEFT 8.38 01:09.7;  Surgeon: Leandrew Koyanagi, MD;  Location: [place];  Service: Ophthalmology;  Laterality: Left;   CATARACT EXTRACTION W/PHACO Right 10/23/2021   Procedure: CATARACT EXTRACTION PHACO AND INTRAOCULAR LENS PLACEMENT ([facility]) RIGHT 5.51 00:48.8;  Surgeon: Leandrew Koyanagi, MD;  Location: [place];  Service: Ophthalmology;  Laterality: Right;   COLONOSCOPY     TONSILLECTOMY      TUBAL LIGATION      Social History   Tobacco Use   Smoking status: Every Day    Packs/day: 0.25    Years: 50.00    Total pack years: 12.50    Types: Cigarettes    Start date: 02/17/1958   Smokeless tobacco: Never   Tobacco comments:    aware needs to stop   Vaping Use   Vaping Use: Never used  Substance Use Topics   Alcohol use: Yes    Alcohol/week: 1.0 standard drink of alcohol    Types: 1 Glasses of wine per week   Drug use: No     Medication list has been reviewed and updated.  Current Meds  Medication Sig   aspirin 81 MG tablet Take 81 mg by mouth daily.   budesonide (ENTOCORT EC) 3 MG 24 hr capsule Take 1 capsule by mouth every morning.   calcium carbonate 1250 MG capsule Take 1,250 mg by mouth 2 (two) times daily with a meal.   lovastatin (MEVACOR) 20 MG tablet TAKE (1) TABLET BY MOUTH EVERY DAY   Magnesium 400 MG TABS    Melatonin 10 MG TABS Take 1 tablet by mouth at bedtime.   montelukast (SINGULAIR) 10 MG tablet TAKE (1) TABLET BY MOUTH EVERY DAY   Multiple Vitamins-Minerals (CENTRUM SILVER ADULT 50+ PO) Take 1 capsule by mouth daily at 6 (six) AM.       03/28/2022    9:15 AM 02/04/2022    2:40 PM 11/11/2021    9:08 AM 10/28/2021    1:33 PM  GAD 7 : Generalized Anxiety Score  Nervous, Anxious, on Edge 0 0 0 0  Control/stop worrying 0 0 0 0  Worry too much - different things 0 0 0 0  Trouble relaxing 0 0 0 0  Restless 0 0 0 0  Easily annoyed or irritable 0 0 0 0  Afraid - awful might happen 0 0 0 0  Total GAD 7 Score 0 0 0 0  Anxiety Difficulty Not difficult at all Not difficult at all Not difficult at all Not difficult at all       03/28/2022    9:15 AM 02/04/2022    2:40 PM 11/11/2021    9:08 AM  Depression screen PHQ 2/9  Decreased Interest 0 0 0  Down, Depressed, Hopeless 0 0 0  PHQ - 2 Score 0 0 0  Altered sleeping 0 0 0  Tired, decreased energy 0 0 0  Change in appetite 0 0 0  Feeling bad or failure about yourself  0 0 0  Trouble  concentrating 0 0 0  Moving slowly or fidgety/restless 0 0 0  Suicidal thoughts 0 0 0  PHQ-9 Score 0 0 0  Difficult doing work/chores Not difficult at all Not difficult at all Not difficult at all    BP Readings from Last 3 Encounters:  03/28/22 122/76  02/04/22 116/72  01/05/22 111/76    Physical Exam Vitals and nursing note reviewed. Exam conducted with a chaperone present.  Constitutional:      General: She is not in acute distress.    Appearance: She is not diaphoretic.  HENT:  Head: Normocephalic and atraumatic.     Right Ear: Tympanic membrane and external ear normal.     Left Ear: Tympanic membrane and external ear normal.     Nose: Nose normal.     Mouth/Throat:     Mouth: Mucous membranes are moist.  Eyes:     General:        Right eye: No discharge.        Left eye: No discharge.     Conjunctiva/sclera: Conjunctivae normal.     Pupils: Pupils are equal, round, and reactive to light.  Neck:     Thyroid: No thyromegaly.     Vascular: No JVD.  Cardiovascular:     Rate and Rhythm: Normal rate and regular rhythm.     Heart sounds: Normal heart sounds. No murmur heard.    No friction rub. No gallop.  Pulmonary:     Effort: Pulmonary effort is normal.     Breath sounds: Normal breath sounds. No wheezing, rhonchi or rales.  Chest:     Chest wall: No tenderness.  Abdominal:     General: Bowel sounds are normal.     Palpations: Abdomen is soft. There is no mass.     Tenderness: There is no abdominal tenderness. There is no guarding.  Musculoskeletal:        General: Normal range of motion.     Cervical back: Normal range of motion and neck supple.  Lymphadenopathy:     Cervical: No cervical adenopathy.  Skin:    General: Skin is warm and dry.  Neurological:     Mental Status: She is alert.     Deep Tendon Reflexes: Reflexes are normal and symmetric.     Wt Readings from Last 3 Encounters:  03/28/22 155 lb (70.3 kg)  02/04/22 156 lb (70.8 kg)  11/11/21  152 lb (68.9 kg)    BP 122/76   Pulse 64   Ht 5' 7"$  (1.702 m)   Wt 155 lb (70.3 kg)   SpO2 95%   BMI 24.28 kg/m   Assessment and Plan: 1. Hyperlipidemia, unspecified hyperlipidemia type Chronic.  Controlled.  Stable.  Continue lovastatin 20 mg once a day will check CMP for hepatic stability on statin. - lovastatin (MEVACOR) 20 MG tablet; TAKE (1) TABLET BY MOUTH EVERY DAY  Dispense: 90 tablet; Refill: 1 - Comprehensive Metabolic Panel (CMET)  2. Mild intermittent asthma, unspecified whether complicated Chronic.  Controlled.  Stable.  Continue montelukast 10 mg daily.  Patient has albuterol to use on an as-needed basis and I have started Symbicort 1 puff in the morning or twice a day to regulate bronchospasm. - montelukast (SINGULAIR) 10 MG tablet; TAKE (1) TABLET BY MOUTH EVERY DAY  Dispense: 90 tablet; Refill: 1  3. Acute maxillary sinusitis, recurrence not specified New onset.   - azithromycin (ZITHROMAX) 250 MG tablet; Take 2 tablets on day 1, then 1 tablet daily on days 2 through 5  Dispense: 6 tablet; Refill: 0  4. Centrilobular emphysema ([place]) Noted on x-ray from December emphysema will suggest along with short acting albuterol and Symbicort 1 to 2 puffs a day to see if this helps with cough and bronchospasm. - budesonide-formoterol (SYMBICORT) 160-4.5 MCG/ACT inhaler; Inhale 2 puffs into the lungs 2 (two) times daily.  Dispense: 1 each; Refill: 3     Otilio Miu, MD

## 2022-03-29 LAB — COMPREHENSIVE METABOLIC PANEL
ALT: 13 IU/L (ref 0–32)
AST: 20 IU/L (ref 0–40)
Albumin/Globulin Ratio: 2.3 — ABNORMAL HIGH (ref 1.2–2.2)
Albumin: 4.3 g/dL (ref 3.8–4.8)
Alkaline Phosphatase: 40 IU/L — ABNORMAL LOW (ref 44–121)
BUN/Creatinine Ratio: 22 (ref 12–28)
BUN: 14 mg/dL (ref 8–27)
Bilirubin Total: 0.3 mg/dL (ref 0.0–1.2)
CO2: 25 mmol/L (ref 20–29)
Calcium: 9.2 mg/dL (ref 8.7–10.3)
Chloride: 102 mmol/L (ref 96–106)
Creatinine, Ser: 0.63 mg/dL (ref 0.57–1.00)
Globulin, Total: 1.9 g/dL (ref 1.5–4.5)
Glucose: 87 mg/dL (ref 70–99)
Potassium: 4.5 mmol/L (ref 3.5–5.2)
Sodium: 140 mmol/L (ref 134–144)
Total Protein: 6.2 g/dL (ref 6.0–8.5)
eGFR: 91 mL/min/{1.73_m2} (ref >59)

## 2022-03-31 ENCOUNTER — Telehealth: Payer: Self-pay

## 2022-04-01 ENCOUNTER — Encounter: Payer: Self-pay | Admitting: Family Medicine

## 2022-04-01 ENCOUNTER — Other Ambulatory Visit: Payer: Self-pay

## 2022-04-01 DIAGNOSIS — J438 Other emphysema: Secondary | ICD-10-CM

## 2022-04-01 NOTE — Telephone Encounter (Signed)
error 

## 2022-04-07 DIAGNOSIS — J301 Allergic rhinitis due to pollen: Secondary | ICD-10-CM | POA: Diagnosis not present

## 2022-04-07 DIAGNOSIS — F17210 Nicotine dependence, cigarettes, uncomplicated: Secondary | ICD-10-CM | POA: Diagnosis not present

## 2022-04-07 DIAGNOSIS — J449 Chronic obstructive pulmonary disease, unspecified: Secondary | ICD-10-CM | POA: Diagnosis not present

## 2022-04-07 DIAGNOSIS — R058 Other specified cough: Secondary | ICD-10-CM | POA: Diagnosis not present

## 2022-04-17 DIAGNOSIS — C44519 Basal cell carcinoma of skin of other part of trunk: Secondary | ICD-10-CM | POA: Diagnosis not present

## 2022-04-17 DIAGNOSIS — Z86018 Personal history of other benign neoplasm: Secondary | ICD-10-CM | POA: Diagnosis not present

## 2022-04-17 DIAGNOSIS — L578 Other skin changes due to chronic exposure to nonionizing radiation: Secondary | ICD-10-CM | POA: Diagnosis not present

## 2022-04-17 DIAGNOSIS — Z872 Personal history of diseases of the skin and subcutaneous tissue: Secondary | ICD-10-CM | POA: Diagnosis not present

## 2022-04-17 DIAGNOSIS — D485 Neoplasm of uncertain behavior of skin: Secondary | ICD-10-CM | POA: Diagnosis not present

## 2022-04-17 DIAGNOSIS — Z85828 Personal history of other malignant neoplasm of skin: Secondary | ICD-10-CM | POA: Diagnosis not present

## 2022-04-24 ENCOUNTER — Encounter: Payer: Self-pay | Admitting: Family Medicine

## 2022-04-24 ENCOUNTER — Ambulatory Visit (INDEPENDENT_AMBULATORY_CARE_PROVIDER_SITE_OTHER): Payer: Medicare HMO | Admitting: Family Medicine

## 2022-04-24 VITALS — BP 126/84 | HR 74 | Temp 97.8°F | Ht 67.0 in | Wt 154.0 lb

## 2022-04-24 DIAGNOSIS — J4520 Mild intermittent asthma, uncomplicated: Secondary | ICD-10-CM | POA: Diagnosis not present

## 2022-04-24 DIAGNOSIS — R051 Acute cough: Secondary | ICD-10-CM

## 2022-04-24 DIAGNOSIS — J432 Centrilobular emphysema: Secondary | ICD-10-CM | POA: Diagnosis not present

## 2022-04-24 DIAGNOSIS — J0100 Acute maxillary sinusitis, unspecified: Secondary | ICD-10-CM | POA: Diagnosis not present

## 2022-04-24 MED ORDER — AMOXICILLIN 500 MG PO CAPS: 500.0000 mg | ORAL_CAPSULE | Freq: Three times a day (TID) | ORAL | 0 refills | Status: AC

## 2022-04-24 MED ORDER — TRIAMCINOLONE ACETONIDE 55 MCG/ACT NA AERO: 2.0000 | INHALATION_SPRAY | Freq: Every day | NASAL | 12 refills | Status: DC

## 2022-04-24 NOTE — Progress Notes (Signed)
Date:  04/24/2022   Name:  Caroline Harris   DOB:  October 21, 1945   MRN:  [id number]   Chief Complaint: Cough (Patient was sent to Dr Flemming-pulmonology. Was told everything was fine. This came back a few weeks ago Nasal congestion, and cough-clear production. No fever, no sob.)  Cough This is a new problem. The current episode started in the past 7 days. The problem has been waxing and waning. The cough is Productive of sputum. Associated symptoms include nasal congestion and postnasal drip. Pertinent negatives include no chest pain, chills, ear pain, fever, headaches, myalgias, rash, rhinorrhea, sore throat, shortness of breath or wheezing. The symptoms are aggravated by pollens. There is no history of environmental allergies.  Sinus Problem This is a new problem. The current episode started in the past 7 days. The problem has been waxing and waning since onset. There has been no fever. The pain is moderate. Associated symptoms include congestion, coughing and sinus pressure. Pertinent negatives include no chills, ear pain, headaches, neck pain, shortness of breath or sore throat.    Lab Results  Component Value Date   NA 140 03/28/2022   K 4.5 03/28/2022   CO2 25 03/28/2022   GLUCOSE 87 03/28/2022   BUN 14 03/28/2022   CREATININE 0.63 03/28/2022   CALCIUM 9.2 03/28/2022   EGFR 91 03/28/2022   GFRNONAA >60 03/31/2021   Lab Results  Component Value Date   CHOL 176 09/25/2021   HDL 84 09/25/2021   LDLCALC 77 09/25/2021   TRIG 85 09/25/2021   CHOLHDL 1.7 11/05/2018   Lab Results  Component Value Date   TSH 1.120 06/01/2017   No results found for: "HGBA1C" Lab Results  Component Value Date   WBC 8.0 09/25/2021   HGB 12.9 09/25/2021   HCT 39.1 09/25/2021   MCV 95 09/25/2021   PLT 289 09/25/2021   Lab Results  Component Value Date   ALT 13 03/28/2022   AST 20 03/28/2022   ALKPHOS 40 (L) 03/28/2022   BILITOT 0.3 03/28/2022   No results found for: "25OHVITD2",  "25OHVITD3", "VD25OH"   Review of Systems  Constitutional:  Negative for chills and fever.  HENT:  Positive for congestion, postnasal drip and sinus pressure. Negative for drooling, ear discharge, ear pain, rhinorrhea and sore throat.   Respiratory:  Positive for cough. Negative for shortness of breath and wheezing.   Cardiovascular:  Negative for chest pain, palpitations and leg swelling.  Gastrointestinal:  Negative for abdominal pain, blood in stool, constipation, diarrhea and nausea.  Endocrine: Negative for polydipsia.  Genitourinary:  Negative for dysuria, frequency, hematuria and urgency.  Musculoskeletal:  Negative for back pain, myalgias and neck pain.  Skin:  Negative for rash.  Allergic/Immunologic: Negative for environmental allergies.  Neurological:  Negative for dizziness and headaches.  Hematological:  Does not bruise/bleed easily.  Psychiatric/Behavioral:  Negative for suicidal ideas. The patient is not nervous/anxious.     Patient Active Problem List   Diagnosis Date Noted   Other synovitis and tenosynovitis, unspecified hand 03/13/2021   Inflammatory pain 03/13/2021   Ganglion cyst of finger of right hand 06/25/2020   Pain in finger of right hand 06/25/2020   Aortic atherosclerosis ([place]) 11/05/2018   COPD, moderate ([place]) 08/23/2015   Asthma, mild intermittent 06/07/2015   Hyperlipidemia 06/07/2015    No Known Allergies  Past Surgical History:  Procedure Laterality Date   ABDOMINAL HYSTERECTOMY     CATARACT EXTRACTION W/PHACO Left 10/09/2021   Procedure: CATARACT  EXTRACTION PHACO AND INTRAOCULAR LENS PLACEMENT ([facility]) LEFT 8.38 01:09.7;  Surgeon: Leandrew Koyanagi, MD;  Location: [place];  Service: Ophthalmology;  Laterality: Left;   CATARACT EXTRACTION W/PHACO Right 10/23/2021   Procedure: CATARACT EXTRACTION PHACO AND INTRAOCULAR LENS PLACEMENT ([facility]) RIGHT 5.51 00:48.8;  Surgeon: Leandrew Koyanagi, MD;  Location: [place];  Service:  Ophthalmology;  Laterality: Right;   COLONOSCOPY     TONSILLECTOMY     TUBAL LIGATION      Social History   Tobacco Use   Smoking status: Every Day    Packs/day: 0.25    Years: 50.00    Total pack years: 12.50    Types: Cigarettes    Start date: 02/17/1958   Smokeless tobacco: Never   Tobacco comments:    aware needs to stop   Vaping Use   Vaping Use: Never used  Substance Use Topics   Alcohol use: Yes    Alcohol/week: 1.0 standard drink of alcohol    Types: 1 Glasses of wine per week   Drug use: No     Medication list has been reviewed and updated.  Current Meds  Medication Sig   budesonide (ENTOCORT EC) 3 MG 24 hr capsule Take 1 capsule by mouth every morning.   budesonide-formoterol (SYMBICORT) 160-4.5 MCG/ACT inhaler Inhale 2 puffs into the lungs 2 (two) times daily.   calcium carbonate 1250 MG capsule Take 1,250 mg by mouth 2 (two) times daily with a meal.   lovastatin (MEVACOR) 20 MG tablet TAKE (1) TABLET BY MOUTH EVERY DAY   Magnesium 400 MG TABS    Melatonin 10 MG TABS Take 1 tablet by mouth at bedtime.   montelukast (SINGULAIR) 10 MG tablet TAKE (1) TABLET BY MOUTH EVERY DAY   Multiple Vitamins-Minerals (CENTRUM SILVER ADULT 50+ PO) Take 1 capsule by mouth daily at 6 (six) AM.       04/24/2022   10:49 AM 03/28/2022    9:15 AM 02/04/2022    2:40 PM 11/11/2021    9:08 AM  GAD 7 : Generalized Anxiety Score  Nervous, Anxious, on Edge 0 0 0 0  Control/stop worrying 0 0 0 0  Worry too much - different things 0 0 0 0  Trouble relaxing 0 0 0 0  Restless 0 0 0 0  Easily annoyed or irritable 0 0 0 0  Afraid - awful might happen 0 0 0 0  Total GAD 7 Score 0 0 0 0  Anxiety Difficulty Not difficult at all Not difficult at all Not difficult at all Not difficult at all       04/24/2022   10:49 AM 03/28/2022    9:15 AM 02/04/2022    2:40 PM  Depression screen PHQ 2/9  Decreased Interest 0 0 0  Down, Depressed, Hopeless 0 0 0  PHQ - 2 Score 0 0 0  Altered sleeping 0  0 0  Tired, decreased energy 0 0 0  Change in appetite 0 0 0  Feeling bad or failure about yourself  0 0 0  Trouble concentrating 0 0 0  Moving slowly or fidgety/restless 0 0 0  Suicidal thoughts 0 0 0  PHQ-9 Score 0 0 0  Difficult doing work/chores Not difficult at all Not difficult at all Not difficult at all    BP Readings from Last 3 Encounters:  04/24/22 126/84  03/28/22 122/76  02/04/22 116/72    Physical Exam Vitals and nursing note reviewed.  Constitutional:  Appearance: She is well-developed.  HENT:     Head: Normocephalic.     Right Ear: Tympanic membrane and external ear normal.     Left Ear: Tympanic membrane and external ear normal.     Nose: Nose normal.     Mouth/Throat:     Mouth: Mucous membranes are moist.  Eyes:     General: Lids are everted, no foreign bodies appreciated. No scleral icterus.       Left eye: No foreign body or hordeolum.     Conjunctiva/sclera: Conjunctivae normal.     Right eye: Right conjunctiva is not injected.     Left eye: Left conjunctiva is not injected.     Pupils: Pupils are equal, round, and reactive to light.  Neck:     Thyroid: No thyromegaly.     Vascular: No JVD.     Trachea: No tracheal deviation.  Cardiovascular:     Rate and Rhythm: Normal rate and regular rhythm.     Heart sounds: Normal heart sounds. No murmur heard.    No friction rub. No gallop.  Pulmonary:     Effort: Pulmonary effort is normal. No respiratory distress.     Breath sounds: Normal breath sounds. No wheezing, rhonchi or rales.  Abdominal:     General: Bowel sounds are normal.     Palpations: Abdomen is soft. There is no mass.     Tenderness: There is no abdominal tenderness. There is no guarding or rebound.  Musculoskeletal:        General: No tenderness. Normal range of motion.     Cervical back: Normal range of motion and neck supple.  Lymphadenopathy:     Cervical: No cervical adenopathy.  Skin:    General: Skin is warm.      Findings: No rash.  Neurological:     Mental Status: She is alert and oriented to person, place, and time.     Cranial Nerves: No cranial nerve deficit.     Deep Tendon Reflexes: Reflexes normal.  Psychiatric:        Mood and Affect: Mood is not anxious or depressed.     Wt Readings from Last 3 Encounters:  04/24/22 154 lb (69.9 kg)  03/28/22 155 lb (70.3 kg)  02/04/22 156 lb (70.8 kg)    BP 126/84   Pulse 74   Temp 97.8 F (36.6 C) (Oral)   Ht '5\' 7"'$  (1.702 m)   Wt 154 lb (69.9 kg)   SpO2 98%   BMI 24.12 kg/m   Assessment and Plan: 1. Acute maxillary sinusitis, recurrence not specified New onset.  Persistent.  Stable.  Will treat for sinusitis and that there is tenderness over the maxillary sinuses and purulent nasal drainage.  Will treat with amoxicillin 500 mg 3 times a day for 10 days and Nasacort, nasal saline, and mucolytic agent with decongestant such as Sudafed. - triamcinolone (NASACORT) 55 MCG/ACT AERO nasal inhaler; Place 2 sprays into the nose daily.  Dispense: 1 each; Refill: 12 - amoxicillin (AMOXIL) 500 MG capsule; Take 1 capsule (500 mg total) by mouth 3 (three) times daily for 10 days.  Dispense: 30 capsule; Refill: 0  2. Centrilobular emphysema ([facility]) Chronic.  Controlled.  Stable.  Continue Symbicort with albuterol inhalers.  3. Mild intermittent asthma, unspecified whether complicated As noted above.  4. Acute cough Tested the patient obtain mucus Sinex DM.    Otilio Miu, MD

## 2022-04-28 ENCOUNTER — Encounter: Payer: Self-pay | Admitting: Family Medicine

## 2022-05-01 ENCOUNTER — Other Ambulatory Visit: Payer: Self-pay | Admitting: *Deleted

## 2022-05-01 DIAGNOSIS — Z87891 Personal history of nicotine dependence: Secondary | ICD-10-CM

## 2022-05-01 DIAGNOSIS — Z122 Encounter for screening for malignant neoplasm of respiratory organs: Secondary | ICD-10-CM

## 2022-05-01 DIAGNOSIS — F17210 Nicotine dependence, cigarettes, uncomplicated: Secondary | ICD-10-CM

## 2022-05-05 ENCOUNTER — Encounter: Payer: Self-pay | Admitting: Family Medicine

## 2022-05-05 ENCOUNTER — Ambulatory Visit (INDEPENDENT_AMBULATORY_CARE_PROVIDER_SITE_OTHER): Payer: Medicare HMO | Admitting: Family Medicine

## 2022-05-05 VITALS — BP 128/76 | HR 67 | Ht 67.0 in | Wt 155.0 lb

## 2022-05-05 DIAGNOSIS — R051 Acute cough: Secondary | ICD-10-CM

## 2022-05-05 DIAGNOSIS — J432 Centrilobular emphysema: Secondary | ICD-10-CM

## 2022-05-05 DIAGNOSIS — J0100 Acute maxillary sinusitis, unspecified: Secondary | ICD-10-CM | POA: Diagnosis not present

## 2022-05-05 MED ORDER — GUAIFENESIN-CODEINE 100-10 MG/5ML PO SYRP: 5.0000 mL | ORAL_SOLUTION | Freq: Three times a day (TID) | ORAL | 0 refills | Status: DC | PRN

## 2022-05-05 MED ORDER — PREDNISONE 10 MG PO TABS: 10.0000 mg | ORAL_TABLET | Freq: Every day | ORAL | 0 refills | Status: DC

## 2022-05-05 MED ORDER — AZITHROMYCIN 250 MG PO TABS: ORAL_TABLET | ORAL | 0 refills | Status: AC

## 2022-05-05 NOTE — Progress Notes (Signed)
Date:  05/05/2022   Name:  Caroline Harris   DOB:  10-21-1945   MRN:  [id number]   Chief Complaint: Cough  Cough This is a chronic problem. The current episode started more than 1 year ago. The problem has been waxing and waning. The cough is Productive of sputum. Pertinent negatives include no chest pain, chills, fever, hemoptysis, postnasal drip, shortness of breath or wheezing.    Lab Results  Component Value Date   NA 140 03/28/2022   K 4.5 03/28/2022   CO2 25 03/28/2022   GLUCOSE 87 03/28/2022   BUN 14 03/28/2022   CREATININE 0.63 03/28/2022   CALCIUM 9.2 03/28/2022   EGFR 91 03/28/2022   GFRNONAA >60 03/31/2021   Lab Results  Component Value Date   CHOL 176 09/25/2021   HDL 84 09/25/2021   LDLCALC 77 09/25/2021   TRIG 85 09/25/2021   CHOLHDL 1.7 11/05/2018   Lab Results  Component Value Date   TSH 1.120 06/01/2017   No results found for: "HGBA1C" Lab Results  Component Value Date   WBC 8.0 09/25/2021   HGB 12.9 09/25/2021   HCT 39.1 09/25/2021   MCV 95 09/25/2021   PLT 289 09/25/2021   Lab Results  Component Value Date   ALT 13 03/28/2022   AST 20 03/28/2022   ALKPHOS 40 (L) 03/28/2022   BILITOT 0.3 03/28/2022   No results found for: "25OHVITD2", "25OHVITD3", "VD25OH"   Review of Systems  Constitutional:  Negative for chills and fever.  HENT:  Negative for postnasal drip and tinnitus.   Respiratory:  Positive for cough. Negative for hemoptysis, chest tightness, shortness of breath and wheezing.   Cardiovascular:  Negative for chest pain and palpitations.    Patient Active Problem List   Diagnosis Date Noted   Other synovitis and tenosynovitis, unspecified hand 03/13/2021   Inflammatory pain 03/13/2021   Ganglion cyst of finger of right hand 06/25/2020   Pain in finger of right hand 06/25/2020   Aortic atherosclerosis ([place]) 11/05/2018   COPD, moderate ([place]) 08/23/2015   Asthma, mild intermittent 06/07/2015   Hyperlipidemia 06/07/2015    No  Known Allergies  Past Surgical History:  Procedure Laterality Date   ABDOMINAL HYSTERECTOMY     CATARACT EXTRACTION W/PHACO Left 10/09/2021   Procedure: CATARACT EXTRACTION PHACO AND INTRAOCULAR LENS PLACEMENT ([place]) LEFT 8.38 01:09.7;  Surgeon: Leandrew Koyanagi, MD;  Location: [facility];  Service: Ophthalmology;  Laterality: Left;   CATARACT EXTRACTION W/PHACO Right 10/23/2021   Procedure: CATARACT EXTRACTION PHACO AND INTRAOCULAR LENS PLACEMENT ([facility]) RIGHT 5.51 00:48.8;  Surgeon: Leandrew Koyanagi, MD;  Location: [facility];  Service: Ophthalmology;  Laterality: Right;   COLONOSCOPY     TONSILLECTOMY     TUBAL LIGATION      Social History   Tobacco Use   Smoking status: Every Day    Packs/day: 0.25    Years: 50.00    Additional pack years: 0.00    Total pack years: 12.50    Types: Cigarettes    Start date: 02/17/1958   Smokeless tobacco: Never   Tobacco comments:    aware needs to stop   Vaping Use   Vaping Use: Never used  Substance Use Topics   Alcohol use: Yes    Alcohol/week: 1.0 standard drink of alcohol    Types: 1 Glasses of wine per week   Drug use: No     Medication list has been reviewed and updated.  Current Meds  Medication  Sig   budesonide (ENTOCORT EC) 3 MG 24 hr capsule Take 1 capsule by mouth every morning.   budesonide-formoterol (SYMBICORT) 160-4.5 MCG/ACT inhaler Inhale 2 puffs into the lungs 2 (two) times daily.   calcium carbonate 1250 MG capsule Take 1,250 mg by mouth 2 (two) times daily with a meal.   lovastatin (MEVACOR) 20 MG tablet TAKE (1) TABLET BY MOUTH EVERY DAY   Magnesium 400 MG TABS    Melatonin 10 MG TABS Take 1 tablet by mouth at bedtime.   montelukast (SINGULAIR) 10 MG tablet TAKE (1) TABLET BY MOUTH EVERY DAY   Multiple Vitamins-Minerals (CENTRUM SILVER ADULT 50+ PO) Take 1 capsule by mouth daily at 6 (six) AM.   triamcinolone (NASACORT) 55 MCG/ACT AERO nasal inhaler Place 2 sprays into the nose daily.        05/05/2022    2:37 PM 04/24/2022   10:49 AM 03/28/2022    9:15 AM 02/04/2022    2:40 PM  GAD 7 : Generalized Anxiety Score  Nervous, Anxious, on Edge 0 0 0 0  Control/stop worrying 0 0 0 0  Worry too much - different things 0 0 0 0  Trouble relaxing 0 0 0 0  Restless 0 0 0 0  Easily annoyed or irritable 0 0 0 0  Afraid - awful might happen 0 0 0 0  Total GAD 7 Score 0 0 0 0  Anxiety Difficulty Not difficult at all Not difficult at all Not difficult at all Not difficult at all       05/05/2022    2:36 PM 04/24/2022   10:49 AM 03/28/2022    9:15 AM  Depression screen PHQ 2/9  Decreased Interest 0 0 0  Down, Depressed, Hopeless 0 0 0  PHQ - 2 Score 0 0 0  Altered sleeping 0 0 0  Tired, decreased energy 0 0 0  Change in appetite 0 0 0  Feeling bad or failure about yourself  0 0 0  Trouble concentrating 0 0 0  Moving slowly or fidgety/restless 0 0 0  Suicidal thoughts 0 0 0  PHQ-9 Score 0 0 0  Difficult doing work/chores Not difficult at all Not difficult at all Not difficult at all    BP Readings from Last 3 Encounters:  05/05/22 128/76  04/24/22 126/84  03/28/22 122/76    Physical Exam Vitals and nursing note reviewed. Exam conducted with a chaperone present.  Constitutional:      General: She is not in acute distress.    Appearance: She is not diaphoretic.  HENT:     Head: Normocephalic and atraumatic.     Right Ear: External ear normal.     Left Ear: External ear normal.     Nose: Nose normal.  Eyes:     General:        Right eye: No discharge.        Left eye: No discharge.     Conjunctiva/sclera: Conjunctivae normal.     Pupils: Pupils are equal, round, and reactive to light.  Neck:     Thyroid: No thyromegaly.     Vascular: No JVD.  Cardiovascular:     Rate and Rhythm: Normal rate and regular rhythm.     Heart sounds: Normal heart sounds. No murmur heard.    No friction rub. No gallop.  Pulmonary:     Effort: Pulmonary effort is normal.     Breath  sounds: Normal breath sounds. Decreased air movement present. No wheezing,  rhonchi or rales.  Abdominal:     General: Bowel sounds are normal.     Palpations: Abdomen is soft. There is no mass.     Tenderness: There is no abdominal tenderness. There is no guarding.  Musculoskeletal:        General: Normal range of motion.     Cervical back: Normal range of motion and neck supple.  Lymphadenopathy:     Cervical: No cervical adenopathy.  Skin:    General: Skin is warm and dry.  Neurological:     Mental Status: She is alert.     Deep Tendon Reflexes: Reflexes are normal and symmetric.     Wt Readings from Last 3 Encounters:  05/05/22 155 lb (70.3 kg)  04/24/22 154 lb (69.9 kg)  03/28/22 155 lb (70.3 kg)    BP 128/76   Pulse 67   Ht 5\' 7"  (1.702 m)   Wt 155 lb (70.3 kg)   SpO2 99%   BMI 24.28 kg/m   Assessment and Plan: 1. Centrilobular emphysema (HCC) Worsening of the COPD component which is not responding to Symbicort and albuterol.  In addition to [place] sample 2 puffs twice a day we will initiate prednisone 10 mg once a day for anti-inflammatory oral approach. - predniSONE (DELTASONE) 10 MG tablet; Take 1 tablet (10 mg total) by mouth daily with breakfast.  Dispense: 30 tablet; Refill: 0  2. Acute cough Acute cough persistent difficult to sleep and to be in public.  Will treat with Robitussin AC. - guaiFENesin-codeine (ROBITUSSIN AC) 100-10 MG/5ML syrup; Take 5 mLs by mouth 3 (three) times daily as needed for cough.  Dispense: 118 mL; Refill: 0  3. Acute non-recurrent maxillary sinusitis Patient does have some upper respiratory infectio which is responded to azithromycin to 50 mg 2 tablets today followed by 1 a day for 4 days thereafter. - azithromycin (ZITHROMAX) 250 MG tablet; Take 2 tablets on day 1, then 1 tablet daily on days 2 through 5  Dispense: 6 tablet; Refill: 0     Otilio Miu, MD

## 2022-05-11 ENCOUNTER — Encounter: Payer: Self-pay | Admitting: Family Medicine

## 2022-05-13 DIAGNOSIS — L905 Scar conditions and fibrosis of skin: Secondary | ICD-10-CM | POA: Diagnosis not present

## 2022-05-13 DIAGNOSIS — C4491 Basal cell carcinoma of skin, unspecified: Secondary | ICD-10-CM | POA: Diagnosis not present

## 2022-05-21 ENCOUNTER — Encounter: Payer: Medicare HMO | Admitting: Acute Care

## 2022-05-21 ENCOUNTER — Ambulatory Visit: Payer: Medicare HMO

## 2022-05-21 ENCOUNTER — Telehealth: Payer: Self-pay | Admitting: Acute Care

## 2022-05-21 NOTE — Telephone Encounter (Signed)
Pt called the office stating that she was scheduled for a SDMV today 4/3 at 11:30 with Judson Roch and that she was supposed to have had a CT at 1:30 which was after the visit.   Pt stated that she never received a call from Judson Roch about the [facility] visit and said that she was currently at the imaging center where her CT was supposed to have been done at and said that they told her since the [facility] never happened that she could not have the  CT performed.   Sarah, please advise on this for pt. Also routing to lung nodule pool.

## 2022-05-21 NOTE — Telephone Encounter (Signed)
Attempted to contact pt and went straight to VM. Left message for pt to call us back to reschedule appt and to make sure she does not have a spamblocker that would be blocking our calls.

## 2022-05-21 NOTE — Telephone Encounter (Signed)
Pt. Was scheduled for a shared decision making visit at 11:30 today. I called her a total of 4 times, leaving a message on the only contact number we have for her as her VM answered each time. I gave her the number to return our calls and she never returned the call. Had to cancel her CT scan that was scheduled for 1:30 today at [facility] as she did not have the required shared decision making visit.  We will call and reschedule her for both SDMV and CT scan. She is 76 so we need to do this as soon as possible.  Contacting her is our biggest barrier to her care.  We need  to see if she has blocks on her phone as that may be a reason she is missing the calls.

## 2022-05-26 ENCOUNTER — Telehealth: Payer: Self-pay | Admitting: Gastroenterology

## 2022-05-26 DIAGNOSIS — R1011 Right upper quadrant pain: Secondary | ICD-10-CM

## 2022-05-26 NOTE — Telephone Encounter (Signed)
Patient is having right side pain under the right breast. The pain is a sharp pain and it is when she eats or swallows. This has been going on since Friday. Denies any nausea,vomiting, GERD or constipation. States  she has had loose stools but this is normal because she has colitis and takes Budesonide.

## 2022-05-26 NOTE — Telephone Encounter (Signed)
Patient calling requesting medical advice. Stating she is having some pain in her right side. Requesting a call from Dr. Verdis Prime CMA.

## 2022-05-27 ENCOUNTER — Other Ambulatory Visit: Payer: Self-pay | Admitting: Family Medicine

## 2022-05-27 DIAGNOSIS — J4520 Mild intermittent asthma, uncomplicated: Secondary | ICD-10-CM

## 2022-05-27 NOTE — Addendum Note (Signed)
Addended by: Radene Knee L on: 05/27/2022 04:43 PM   Modules accepted: Orders

## 2022-05-27 NOTE — Telephone Encounter (Signed)
Order lab work and called and left a message for call back  

## 2022-05-27 NOTE — Telephone Encounter (Signed)
Check LFTs Try over-the-counter Prilosec 20 mg twice daily before meals for 2 weeks  RV

## 2022-05-28 ENCOUNTER — Ambulatory Visit (INDEPENDENT_AMBULATORY_CARE_PROVIDER_SITE_OTHER): Payer: Medicare HMO

## 2022-05-28 VITALS — Ht 67.0 in | Wt 155.0 lb

## 2022-05-28 DIAGNOSIS — Z0000 Encounter for general adult medical examination without abnormal findings: Secondary | ICD-10-CM

## 2022-05-28 DIAGNOSIS — R1011 Right upper quadrant pain: Secondary | ICD-10-CM | POA: Diagnosis not present

## 2022-05-28 NOTE — Patient Instructions (Signed)
Caroline Harris , Thank you for taking time to come for your Medicare Wellness Visit. I appreciate your ongoing commitment to your health goals. Please review the following plan we discussed and let me know if I can assist you in the future.   These are the goals we discussed:  Goals      DIET - EAT MORE FRUITS AND VEGETABLES     Increase physical activity     Recommend increasing physical activity to at least 3 days per week     Quit Smoking     Pt states she would like to quit smoking over the next year.         This is a list of the screening recommended for you and due dates:  Health Maintenance  Topic Date Due   COVID-19 Vaccine (7 - 2023-24 season) 01/16/2022   Flu Shot  09/18/2022   Mammogram  03/26/2023   Colon Cancer Screening  05/14/2023   Medicare Annual Wellness Visit  05/28/2023   Pneumonia Vaccine  Completed   DEXA scan (bone density measurement)  Completed   Hepatitis C Screening: USPSTF Recommendation to screen - Ages 73-79 yo.  Completed   Zoster (Shingles) Vaccine  Completed   HPV Vaccine  Aged Out   DTaP/Tdap/Td vaccine  Discontinued    Advanced directives: no  Conditions/risks identified: none  Next appointment: Follow up in one year for your annual wellness visit 06/03/23 @ 9:15 am by phone   Preventive Care 65 Years and Older, Female Preventive care refers to lifestyle choices and visits with your health care provider that can promote health and wellness. What does preventive care include? A yearly physical exam. This is also called an annual well check. Dental exams once or twice a year. Routine eye exams. Ask your health care provider how often you should have your eyes checked. Personal lifestyle choices, including: Daily care of your teeth and gums. Regular physical activity. Eating a healthy diet. Avoiding tobacco and drug use. Limiting alcohol use. Practicing safe sex. Taking low-dose aspirin every day. Taking vitamin and mineral supplements  as recommended by your health care provider. What happens during an annual well check? The services and screenings done by your health care provider during your annual well check will depend on your age, overall health, lifestyle risk factors, and family history of disease. Counseling  Your health care provider may ask you questions about your: Alcohol use. Tobacco use. Drug use. Emotional well-being. Home and relationship well-being. Sexual activity. Eating habits. History of falls. Memory and ability to understand (cognition). Work and work Astronomer. Reproductive health. Screening  You may have the following tests or measurements: Height, weight, and BMI. Blood pressure. Lipid and cholesterol levels. These may be checked every 5 years, or more frequently if you are over 71 years old. Skin check. Lung cancer screening. You may have this screening every year starting at age 16 if you have a 30-pack-year history of smoking and currently smoke or have quit within the past 15 years. Fecal occult blood test (FOBT) of the stool. You may have this test every year starting at age 78. Flexible sigmoidoscopy or colonoscopy. You may have a sigmoidoscopy every 5 years or a colonoscopy every 10 years starting at age 19. Hepatitis C blood test. Hepatitis B blood test. Sexually transmitted disease (STD) testing. Diabetes screening. This is done by checking your blood sugar (glucose) after you have not eaten for a while (fasting). You may have this done every 1-3 years.  Bone density scan. This is done to screen for osteoporosis. You may have this done starting at age 29. Mammogram. This may be done every 1-2 years. Talk to your health care provider about how often you should have regular mammograms. Talk with your health care provider about your test results, treatment options, and if necessary, the need for more tests. Vaccines  Your health care provider may recommend certain vaccines, such  as: Influenza vaccine. This is recommended every year. Tetanus, diphtheria, and acellular pertussis (Tdap, Td) vaccine. You may need a Td booster every 10 years. Zoster vaccine. You may need this after age 51. Pneumococcal 13-valent conjugate (PCV13) vaccine. One dose is recommended after age 83. Pneumococcal polysaccharide (PPSV23) vaccine. One dose is recommended after age 34. Talk to your health care provider about which screenings and vaccines you need and how often you need them. This information is not intended to replace advice given to you by your health care provider. Make sure you discuss any questions you have with your health care provider. Document Released: 03/02/2015 Document Revised: 10/24/2015 Document Reviewed: 12/05/2014 Elsevier Interactive Patient Education  2017 [place] Prevention in the Home Falls can cause injuries. They can happen to people of all ages. There are many things you can do to make your home safe and to help prevent falls. What can I do on the outside of my home? Regularly fix the edges of walkways and driveways and fix any cracks. Remove anything that might make you trip as you walk through a door, such as a raised step or threshold. Trim any bushes or trees on the path to your home. Use bright outdoor lighting. Clear any walking paths of anything that might make someone trip, such as rocks or tools. Regularly check to see if handrails are loose or broken. Make sure that both sides of any steps have handrails. Any raised decks and porches should have guardrails on the edges. Have any leaves, snow, or ice cleared regularly. Use sand or salt on walking paths during winter. Clean up any spills in your garage right away. This includes oil or grease spills. What can I do in the bathroom? Use night lights. Install grab bars by the toilet and in the tub and shower. Do not use towel bars as grab bars. Use non-skid mats or decals in the tub or  shower. If you need to sit down in the shower, use a plastic, non-slip stool. Keep the floor dry. Clean up any water that spills on the floor as soon as it happens. Remove soap buildup in the tub or shower regularly. Attach bath mats securely with double-sided non-slip rug tape. Do not have throw rugs and other things on the floor that can make you trip. What can I do in the bedroom? Use night lights. Make sure that you have a light by your bed that is easy to reach. Do not use any sheets or blankets that are too big for your bed. They should not hang down onto the floor. Have a firm chair that has side arms. You can use this for support while you get dressed. Do not have throw rugs and other things on the floor that can make you trip. What can I do in the kitchen? Clean up any spills right away. Avoid walking on wet floors. Keep items that you use a lot in easy-to-reach places. If you need to reach something above you, use a strong step stool that has a grab bar.  Keep electrical cords out of the way. Do not use floor polish or wax that makes floors slippery. If you must use wax, use non-skid floor wax. Do not have throw rugs and other things on the floor that can make you trip. What can I do with my stairs? Do not leave any items on the stairs. Make sure that there are handrails on both sides of the stairs and use them. Fix handrails that are broken or loose. Make sure that handrails are as long as the stairways. Check any carpeting to make sure that it is firmly attached to the stairs. Fix any carpet that is loose or worn. Avoid having throw rugs at the top or bottom of the stairs. If you do have throw rugs, attach them to the floor with carpet tape. Make sure that you have a light switch at the top of the stairs and the bottom of the stairs. If you do not have them, ask someone to add them for you. What else can I do to help prevent falls? Wear shoes that: Do not have high heels. Have  rubber bottoms. Are comfortable and fit you well. Are closed at the toe. Do not wear sandals. If you use a stepladder: Make sure that it is fully opened. Do not climb a closed stepladder. Make sure that both sides of the stepladder are locked into place. Ask someone to hold it for you, if possible. Clearly mark and make sure that you can see: Any grab bars or handrails. First and last steps. Where the edge of each step is. Use tools that help you move around (mobility aids) if they are needed. These include: Canes. Walkers. Scooters. Crutches. Turn on the lights when you go into a dark area. Replace any light bulbs as soon as they burn out. Set up your furniture so you have a clear path. Avoid moving your furniture around. If any of your floors are uneven, fix them. If there are any pets around you, be aware of where they are. Review your medicines with your doctor. Some medicines can make you feel dizzy. This can increase your chance of falling. Ask your doctor what other things that you can do to help prevent falls. This information is not intended to replace advice given to you by your health care provider. Make sure you discuss any questions you have with your health care provider. Document Released: 11/30/2008 Document Revised: 07/12/2015 Document Reviewed: 03/10/2014 Elsevier Interactive Patient Education  2017 [employer].

## 2022-05-28 NOTE — Telephone Encounter (Signed)
Called and left a message for call back  

## 2022-05-28 NOTE — Progress Notes (Signed)
I connected with  Caroline Harris on 05/28/22 by a audio enabled telemedicine application and verified that I am speaking with the correct person using two identifiers.  Patient Location: Home  Provider Location: Office/Clinic  I discussed the limitations of evaluation and management by telemedicine. The patient expressed understanding and agreed to proceed.  Subjective:   Caroline Harris is a 77 y.o. female who presents for Medicare Annual (Subsequent) preventive examination.  Review of Systems     Cardiac Risk Factors include: advanced age (>64men, >75 women);dyslipidemia     Objective:    There were no vitals filed for this visit. There is no height or weight on file to calculate BMI.     05/28/2022   10:06 AM 10/23/2021   11:44 AM 10/09/2021   12:49 PM 05/22/2021   10:38 AM 03/29/2021    1:20 PM 05/21/2020   11:38 AM 05/11/2019    3:34 PM  Advanced Directives  Does Patient Have a Medical Advance Directive? No No No No No No No  Would patient like information on creating a medical advance directive? No - Patient declined No - Patient declined No - Patient declined No - Patient declined No - Patient declined Yes (MAU/Ambulatory/Procedural Areas - Information given) No - Patient declined    Current Medications (verified) Outpatient Encounter Medications as of 05/28/2022  Medication Sig   budesonide (ENTOCORT EC) 3 MG 24 hr capsule Take 1 capsule by mouth every morning.   calcium carbonate 1250 MG capsule Take 1,250 mg by mouth 2 (two) times daily with a meal.   lovastatin (MEVACOR) 20 MG tablet TAKE (1) TABLET BY MOUTH EVERY DAY   Magnesium 400 MG TABS    Melatonin 10 MG TABS Take 1 tablet by mouth at bedtime.   montelukast (SINGULAIR) 10 MG tablet TAKE (1) TABLET BY MOUTH EVERY DAY   Multiple Vitamins-Minerals (CENTRUM SILVER ADULT 50+ PO) Take 1 capsule by mouth daily at 6 (six) AM.   predniSONE (DELTASONE) 10 MG tablet Take 1 tablet (10 mg total) by mouth daily with breakfast.    budesonide-formoterol (SYMBICORT) 160-4.5 MCG/ACT inhaler Inhale 2 puffs into the lungs 2 (two) times daily.   guaiFENesin-codeine (ROBITUSSIN AC) 100-10 MG/5ML syrup Take 5 mLs by mouth 3 (three) times daily as needed for cough.   triamcinolone (NASACORT) 55 MCG/ACT AERO nasal inhaler Place 2 sprays into the nose daily.   No facility-administered encounter medications on file as of 05/28/2022.    Allergies (verified) Patient has no known allergies.   History: Past Medical History:  Diagnosis Date   Arthritis    In fingers   Cataract March, 2023   Will have removed   Colitis    Collagenous colitis    COPD, moderate 08/23/2015   Diverticulitis    Diverticulitis 03/29/2021   Febrile seizure    as infant   Hyperlipidemia    Osteopenia    Restless leg    Wears dentures    partial lower   Past Surgical History:  Procedure Laterality Date   ABDOMINAL HYSTERECTOMY     CATARACT EXTRACTION W/PHACO Left 10/09/2021   Procedure: CATARACT EXTRACTION PHACO AND INTRAOCULAR LENS PLACEMENT (IOC) LEFT 8.38 01:09.7;  Surgeon: Lockie Mola, MD;  Location: [facility];  Service: Ophthalmology;  Laterality: Left;   CATARACT EXTRACTION W/PHACO Right 10/23/2021   Procedure: CATARACT EXTRACTION PHACO AND INTRAOCULAR LENS PLACEMENT (IOC) RIGHT 5.51 00:48.8;  Surgeon: Lockie Mola, MD;  Location: [facility];  Service: Ophthalmology;  Laterality: Right;  COLONOSCOPY     TONSILLECTOMY     TUBAL LIGATION     Family History  Problem Relation Age of Onset   Breast cancer Mother 58   Cancer Mother    Miscarriages / [place] Mother    Heart attack Father    Heart disease Father    Social History   Socioeconomic History   Marital status: Married    Spouse name: Not on file   Number of children: 3   Years of education: Not on file   Highest education level: Bachelor's degree (e.g., BA, AB, BS)  Occupational History    Comment: part time music teacher at [employer]  Tobacco Use   Smoking status: Every Day    Packs/day: 0.25    Years: 50.00    Additional pack years: 0.00    Total pack years: 12.50    Types: Cigarettes    Start date: 02/17/1958   Smokeless tobacco: Never   Tobacco comments:    aware needs to stop   Vaping Use   Vaping Use: Never used  Substance and Sexual Activity   Alcohol use: Yes    Alcohol/week: 1.0 standard drink of alcohol    Types: 1 Glasses of wine per week   Drug use: No   Sexual activity: Not Currently    Birth control/protection: Abstinence, Surgical    Comment: Hysterectomy  Other Topics Concern   Not on file  Social History Narrative   Not on file   Social Determinants of Health   Financial Resource Strain: Low Risk  (05/28/2022)   Overall Financial Resource Strain (CARDIA)    Difficulty of Paying Living Expenses: Not hard at all  Food Insecurity: No Food Insecurity (05/28/2022)   Hunger Vital Sign    Worried About Running Out of Food in the Last Year: Never true    Ran Out of Food in the Last Year: Never true  Transportation Needs: No Transportation Needs (05/28/2022)   PRAPARE - Administrator, Civil Service (Medical): No    Lack of Transportation (Non-Medical): No  Physical Activity: Insufficiently Active (05/28/2022)   Exercise Vital Sign    Days of Exercise per Week: 2 days    Minutes of Exercise per Session: 30 min  Stress: No Stress Concern Present (05/28/2022)   [employer] of Occupational Health - Occupational Stress Questionnaire    Feeling of Stress : Not at all  Social Connections: Moderately Integrated (05/28/2022)   Social Connection and Isolation Panel [NHANES]    Frequency of Communication with Friends and Family: More than three times a week    Frequency of Social Gatherings with Friends and Family: Once a week    Attends Religious Services: More than 4 times per year    Active Member of [employer] or Organizations: No    Attends Hospital doctor: Never    Marital Status: Married    Tobacco Counseling Ready to quit: Not Answered Counseling given: Not Answered Tobacco comments: aware needs to stop    Clinical Intake:  Pre-visit preparation completed: Yes  Pain : No/denies pain     Nutritional Risks: None Diabetes: No  How often do you need to have someone help you when you read instructions, pamphlets, or other written materials from your doctor or pharmacy?: 1 - Never  Diabetic?no  Interpreter Needed?: No  Information entered by :: Kennedy Bucker, LPN   Activities of Daily Living    05/28/2022   10:09 AM 05/23/2022  10:25 AM  In your present state of health, do you have any difficulty performing the following activities:  Hearing? 0 0  Vision? 0 0  Difficulty concentrating or making decisions? 0 0  Walking or climbing stairs? 0 0  Dressing or bathing? 0 0  Doing errands, shopping? 0 0  Preparing Food and eating ? N N  Using the Toilet? N N  In the past six months, have you accidently leaked urine? N N  Do you have problems with loss of bowel control? N N  Managing your Medications? N N  Managing your Finances? N N  Housekeeping or managing your Housekeeping? N N    Patient Care Team: Duanne LimerickJones, Deanna C, MD as PCP - General (Family Medicine) Toney ReilVanga, Rohini Reddy, MD as Consulting Physician (Gastroenterology) Dominica SeverinGramig, William, MD as Consulting Physician (Orthopedic Surgery)  Indicate any recent Medical Services you may have received from other than [facility] providers in the past year (date may be approximate).     Assessment:   This is a routine wellness examination for Alona BeneJoyce.  Hearing/Vision screen Hearing Screening - Comments:: No aids Vision Screening - Comments:: Wears glasses- Dr.Brasington  Dietary issues and exercise activities discussed: Current Exercise Habits: Home exercise routine, Type of exercise: walking, Time (Minutes): 30, Frequency (Times/Week): 2, Weekly Exercise (Minutes/Week):  60, Intensity: Mild   Goals Addressed             This Visit's Progress    DIET - EAT MORE FRUITS AND VEGETABLES         Depression Screen    05/28/2022   10:05 AM 05/05/2022    2:36 PM 04/24/2022   10:49 AM 03/28/2022    9:15 AM 02/04/2022    2:40 PM 11/11/2021    9:08 AM 10/28/2021    1:32 PM  PHQ 2/9 Scores  PHQ - 2 Score 0 0 0 0 0 0 0  PHQ- 9 Score 0 0 0 0 0 0 0    Fall Risk    05/28/2022   10:07 AM 05/23/2022   10:25 AM 05/05/2022    2:36 PM 04/24/2022   10:49 AM 03/28/2022    9:14 AM  Fall Risk   Falls in the past year? 0 0 0 0 0  Number falls in past yr: 0 0 0 0 0  Injury with Fall? 0 0 0 0 0  Risk for fall due to : No Fall Risks  No Fall Risks No Fall Risks No Fall Risks  Follow up Falls prevention discussed  Falls evaluation completed Falls evaluation completed Falls evaluation completed    FALL RISK PREVENTION PERTAINING TO THE HOME:  Any stairs in or around the home? Yes  If so, are there any without handrails? No  Home free of loose throw rugs in walkways, pet beds, electrical cords, etc? Yes  Adequate lighting in your home to reduce risk of falls? Yes   ASSISTIVE DEVICES UTILIZED TO PREVENT FALLS:  Life alert? No  Use of a cane, walker or w/c? No  Grab bars in the bathroom? No  Shower chair or bench in shower? No  Elevated toilet seat or a handicapped toilet? Yes    Cognitive Function:        05/28/2022   10:17 AM 05/11/2019    3:36 PM 04/05/2018   11:56 AM  6CIT Screen  What Year? 0 points 0 points 0 points  What month? 0 points 0 points 0 points  What time? 0 points 0 points 0  points  Count back from 20 0 points 0 points 0 points  Months in reverse 0 points 0 points 0 points  Repeat phrase 0 points 0 points 0 points  Total Score 0 points 0 points 0 points    Immunizations Immunization History  Administered Date(s) Administered   Fluad Quad(high Dose 65+) 11/05/2018, 11/11/2021   Influenza Inj Mdck Quad Pf 11/20/2016   Influenza, High Dose  Seasonal PF 11/25/2017   Influenza,inj,Quad PF,6+ Mos 01/04/2015, 12/13/2015   Influenza-Unspecified 11/25/2017, 11/26/2019, 11/18/2020   Moderna Covid-19 Vaccine Bivalent Booster 24yrs & up 05/02/2021   Moderna Sars-Covid-2 Vaccination 03/18/2019, 04/15/2019, 12/19/2019, 01/20/2021, 11/21/2021   Pneumococcal Conjugate-13 08/13/2015   Pneumococcal Polysaccharide-23 03/21/2011   Tdap 03/21/2011   Zoster Recombinat (Shingrix) 12/02/2017, 02/02/2018   Zoster, Live 02/18/2015    TDAP status: Due, Education has been provided regarding the importance of this vaccine. Advised may receive this vaccine at local pharmacy or Health Dept. Aware to provide a copy of the vaccination record if obtained from local pharmacy or Health Dept. Verbalized acceptance and understanding.  Flu Vaccine status: Up to date  Pneumococcal vaccine status: Up to date  Covid-19 vaccine status: Completed vaccines  Qualifies for Shingles Vaccine? Yes   Zostavax completed Yes   Shingrix Completed?: Yes  Screening Tests Health Maintenance  Topic Date Due   COVID-19 Vaccine (7 - 2023-24 season) 01/16/2022   INFLUENZA VACCINE  09/18/2022   MAMMOGRAM  03/26/2023   COLONOSCOPY (Pts 45-37yrs Insurance coverage will need to be confirmed)  05/14/2023   Medicare Annual Wellness (AWV)  05/28/2023   Pneumonia Vaccine 69+ Years old  Completed   DEXA SCAN  Completed   Hepatitis C Screening  Completed   Zoster Vaccines- Shingrix  Completed   HPV VACCINES  Aged Out   DTaP/Tdap/Td  Discontinued    Health Maintenance  Health Maintenance Due  Topic Date Due   COVID-19 Vaccine (7 - 2023-24 season) 01/16/2022    Colorectal cancer screening: No longer required.   Mammogram status: Completed 03/25/22. Repeat every year  Bone Density status: Completed 06/26/20. Results reflect: Bone density results: OSTEOPENIA. Repeat every 5 years.  Lung Cancer Screening: (Low Dose CT Chest recommended if Age 56-80 years, 30 pack-year  currently smoking OR have quit w/in 15years.) does qualify.   Lung Cancer Screening Referral: reschedule needed  Additional Screening:  Hepatitis C Screening: does qualify; Completed 02/22/16  Vision Screening: Recommended annual ophthalmology exams for early detection of glaucoma and other disorders of the eye. Is the patient up to date with their annual eye exam?  Yes  Who is the provider or what is the name of the office in which the patient attends annual eye exams? Dr.Brasington If pt is not established with a provider, would they like to be referred to a provider to establish care? No .   Dental Screening: Recommended annual dental exams for proper oral hygiene  Community Resource Referral / Chronic Care Management: CRR required this visit?  No   CCM required this visit?  No      Plan:     I have personally reviewed and noted the following in the patient's chart:   Medical and social history Use of alcohol, tobacco or illicit drugs  Current medications and supplements including opioid prescriptions. Patient is not currently taking opioid prescriptions. Functional ability and status Nutritional status Physical activity Advanced directives List of other physicians Hospitalizations, surgeries, and ER visits in previous 12 months Vitals Screenings to include cognitive, depression, and  falls Referrals and appointments  In addition, I have reviewed and discussed with patient certain preventive protocols, quality metrics, and best practice recommendations. A written personalized care plan for preventive services as well as general preventive health recommendations were provided to patient.     Hal Hope, LPN   [id number]/35/5732   Nurse Notes: none

## 2022-05-28 NOTE — Telephone Encounter (Signed)
Patient verbalized understanding of instructions. She states the pain is better and does not know if she needs to do these things. Informed patient if the pain was better then she did not have to get the medications but I would at least go for the blood work. She states she will go for lab work today or tomorrow to the lab in [place]

## 2022-05-29 LAB — HEPATIC FUNCTION PANEL
ALT: 21 IU/L (ref 0–32)
AST: 26 IU/L (ref 0–40)
Albumin: 4.6 g/dL (ref 3.8–4.8)
Alkaline Phosphatase: 41 IU/L — ABNORMAL LOW (ref 44–121)
Bilirubin Total: 0.3 mg/dL (ref 0.0–1.2)
Bilirubin, Direct: 0.1 mg/dL (ref 0.00–0.40)
Total Protein: 6.8 g/dL (ref 6.0–8.5)

## 2022-05-30 NOTE — Telephone Encounter (Signed)
Received VM from pt regarding rescheduling lung screening appt. Attempted to contact pt but phone once again went straight to voicemail. Left message for pt to add our number to her list of approved numbers if she has a spam blocker on her phone. Also called and left mesaage on her husband's voicemail to have her give [place] a call.

## 2022-05-30 NOTE — Telephone Encounter (Signed)
Spoke with patient ReScheduled [facility] 06/10/22 11:00 CT rescheduled 06/10/22 1:30 Pt voiced understanding and had no further questions.  Will call husband's phone for [facility] at [phone]

## 2022-06-02 NOTE — Telephone Encounter (Signed)
Pt has had the [facility] visit rescheduled.

## 2022-06-10 ENCOUNTER — Ambulatory Visit
Admission: RE | Admit: 2022-06-10 | Discharge: 2022-06-10 | Disposition: A | Discharge status: Home / Self Care | Payer: Medicare HMO | Source: Ambulatory Visit | Attending: Acute Care | Admitting: Acute Care

## 2022-06-10 ENCOUNTER — Encounter: Payer: Self-pay | Admitting: Physician Assistant

## 2022-06-10 ENCOUNTER — Ambulatory Visit (INDEPENDENT_AMBULATORY_CARE_PROVIDER_SITE_OTHER): Payer: Medicare HMO | Admitting: Physician Assistant

## 2022-06-10 DIAGNOSIS — F17210 Nicotine dependence, cigarettes, uncomplicated: Secondary | ICD-10-CM | POA: Diagnosis not present

## 2022-06-10 DIAGNOSIS — Z122 Encounter for screening for malignant neoplasm of respiratory organs: Secondary | ICD-10-CM

## 2022-06-10 DIAGNOSIS — Z87891 Personal history of nicotine dependence: Secondary | ICD-10-CM | POA: Insufficient documentation

## 2022-06-10 NOTE — Progress Notes (Signed)
Virtual Visit via Telephone Note  I connected with [name] on 06/10/22 at 11:00 AM EDT by telephone and verified that I am speaking with the correct person using two identifiers.  Location: Patient: home Provider: working virtually from home   I discussed the limitations, risks, security and privacy concerns of performing an evaluation and management service by telephone and the availability of in person appointments. I also discussed with the patient that there may be a patient responsible charge related to this service. The patient expressed understanding and agreed to proceed.       Shared Decision Making Visit Lung Cancer Screening Program [phone])   Eligibility: Age 77 y.o. Pack Years Smoking History Calculation  (# packs/per year x # years smoked) Recent History of coughing up blood  no Unexplained weight loss? no ( >Than 15 pounds within the last 6 months ) Prior History Lung / other cancer no (Diagnosis within the last 5 years already requiring surveillance chest CT Scans). Smoking Status Current Smoker   Visit Components: Discussion included one or more decision making aids. yes Discussion included risk/benefits of screening. yes Discussion included potential follow up diagnostic testing for abnormal scans. yes Discussion included meaning and risk of over diagnosis. yes Discussion included meaning and risk of False Positives. yes Discussion included meaning of total radiation exposure. yes  Counseling Included: Importance of adherence to annual lung cancer LDCT screening. yes Impact of comorbidities on ability to participate in the program. yes Ability and willingness to under diagnostic treatment. yes  Smoking Cessation Counseling: Current Smokers:  Discussed importance of smoking cessation. yes Information about tobacco cessation classes and interventions provided to patient. yes Patient provided with "ticket" for LDCT Scan. N/a Symptomatic Patient.  no Diagnosis Code: Tobacco Use Z72.0 Asymptomatic Patient yes  Counseling (Intermediate counseling: > three minutes counseling) U0454 Information about tobacco cessation classes and interventions provided to patient. Yes Written Order for Lung Cancer Screening with LDCT placed in Epic. Yes (CT Chest Lung Cancer Screening Low Dose W/O CM) [id number] Z12.2-Screening of respiratory organs Z87.891-Personal history of nicotine dependence    I have spent 25 minutes of face to face/ virtual visit   time with the patient  discussing the risks and benefits of lung cancer screening. We viewed / discussed a power point together that explained in detail the above noted topics. We paused at intervals to allow for questions to be asked and answered to ensure understanding.We discussed that the single most powerful action that she can take to decrease her risk of developing lung cancer is to quit smoking. We discussed whether or not she is ready to commit to setting a quit date. We discussed options for tools to aid in quitting smoking including nicotine replacement therapy, non-nicotine medications, support groups, Quit Smart classes, and behavior modification. We discussed that often times setting smaller, more achievable goals, such as eliminating 1 cigarette a day for a week and then 2 cigarettes a day for a week can be helpful in slowly decreasing the number of cigarettes smoked. This allows for a sense of accomplishment as well as providing a clinical benefit. I provided  her  with smoking cessation  information  with contact information for community resources, classes, free nicotine replacement therapy, and access to mobile apps, text messaging, and on-line smoking cessation help. I have also provided  her  the office contact information in the event she needs to contact me, or the screening staff. We discussed the time and location of the  scan, and that either Abigail Miyamoto RN, Karlton Lemon, RN  or I will call  / send a letter with the results within 24-72 hours of receiving them. The patient verbalized understanding of all of  the above and had no further questions upon leaving the office. They have my contact information in the event they have any further questions.  I spent three minutes counseling on smoking cessation and the health risks of continued tobacco abuse.  I explained to the patient that there has been a high incidence of coronary artery disease noted on these exams. I explained that this is a non-gated exam therefore degree or severity cannot be determined. This patient is on statin therapy. I have asked the patient to follow-up with their PCP regarding any incidental finding of coronary artery disease and management with diet or medication as their PCP  feels is clinically indicated. The patient verbalized understanding of the above and had no further questions upon completion of the visit.       Darcella Gasman Curly Mackowski, PA-C

## 2022-06-10 NOTE — Patient Instructions (Signed)
Thank you for participating in the [facility] Cancer Screening Program. It was our pleasure to meet you today. We will call you with the results of your scan within the next few days. Your scan will be assigned a Lung RADS category score by the physicians reading the scans.  This Lung RADS score determines follow up scanning.  See below for description of categories, and follow up screening recommendations. We will be in touch to schedule your follow up screening annually or based on recommendations of our providers. We will fax a copy of your scan results to your Primary Care Physician, or the physician who referred you to the program, to ensure they have the results. Please call the office if you have any questions or concerns regarding your scanning experience or results.  Our office number is [phone]. Please speak with Denise Phelps, RN. , or  Denise Buckner RN, They are  our Lung Cancer Screening RN.'s If They are unavailable when you call, Please leave a message on the voice mail. We will return your call at our earliest convenience.This voice mail is monitored several times a day.  Remember, if your scan is normal, we will scan you annually as long as you continue to meet the criteria for the program. (Age 50-80, Current smoker or smoker who has quit within the last 15 years). If you are a smoker, remember, quitting is the single most powerful action that you can take to decrease your risk of lung cancer and other pulmonary, breathing related problems. We know quitting is hard, and we are here to help.  Please let us know if there is anything we can do to help you meet your goal of quitting. If you are a former smoker, congratulations. We are proud of you! Remain smoke free! Remember you can refer friends or family members through the number above.  We will screen them to make sure they meet criteria for the program. Thank you for helping us take better care of you by  participating in Lung Screening.  You can receive free nicotine replacement therapy ( patches, gum or mints) by calling 1-800-QUIT NOW. Please call so we can get you on the path to becoming  a non-smoker. I know it is hard, but you can do this!  Lung RADS Categories:  Lung RADS 1: no nodules or definitely non-concerning nodules.  Recommendation is for a repeat annual scan in 12 months.  Lung RADS 2:  nodules that are non-concerning in appearance and behavior with a very low likelihood of becoming an active cancer. Recommendation is for a repeat annual scan in 12 months.  Lung RADS 3: nodules that are probably non-concerning , includes nodules with a low likelihood of becoming an active cancer.  Recommendation is for a 6-month repeat screening scan. Often noted after an upper respiratory illness. We will be in touch to make sure you have no questions, and to schedule your 6-month scan.  Lung RADS 4 A: nodules with concerning findings, recommendation is most often for a follow up scan in 3 months or additional testing based on our provider's assessment of the scan. We will be in touch to make sure you have no questions and to schedule the recommended 3 month follow up scan.  Lung RADS 4 B:  indicates findings that are concerning. We will be in touch with you to schedule additional diagnostic testing based on our provider's  assessment of the scan.  Other options for assistance in smoking cessation (   As covered by your insurance benefits)  Hypnosis for smoking cessation  [facility] [phone]  Acupuncture for smoking cessation  [facility] [phone]   

## 2022-06-13 ENCOUNTER — Other Ambulatory Visit: Payer: Self-pay | Admitting: Acute Care

## 2022-06-13 DIAGNOSIS — Z122 Encounter for screening for malignant neoplasm of respiratory organs: Secondary | ICD-10-CM

## 2022-06-13 DIAGNOSIS — F17210 Nicotine dependence, cigarettes, uncomplicated: Secondary | ICD-10-CM

## 2022-06-13 DIAGNOSIS — Z87891 Personal history of nicotine dependence: Secondary | ICD-10-CM

## 2022-06-17 ENCOUNTER — Ambulatory Visit (INDEPENDENT_AMBULATORY_CARE_PROVIDER_SITE_OTHER): Payer: Medicare HMO | Admitting: Family Medicine

## 2022-06-17 ENCOUNTER — Encounter: Payer: Self-pay | Admitting: Family Medicine

## 2022-06-17 VITALS — BP 136/88 | HR 74 | Ht 67.0 in | Wt 155.0 lb

## 2022-06-17 DIAGNOSIS — E785 Hyperlipidemia, unspecified: Secondary | ICD-10-CM | POA: Diagnosis not present

## 2022-06-17 DIAGNOSIS — F17210 Nicotine dependence, cigarettes, uncomplicated: Secondary | ICD-10-CM | POA: Diagnosis not present

## 2022-06-17 DIAGNOSIS — R030 Elevated blood-pressure reading, without diagnosis of hypertension: Secondary | ICD-10-CM

## 2022-06-17 DIAGNOSIS — R9431 Abnormal electrocardiogram [ECG] [EKG]: Secondary | ICD-10-CM | POA: Diagnosis not present

## 2022-06-17 DIAGNOSIS — I2510 Atherosclerotic heart disease of native coronary artery without angina pectoris: Secondary | ICD-10-CM | POA: Diagnosis not present

## 2022-06-18 ENCOUNTER — Encounter: Payer: Self-pay | Admitting: Family Medicine

## 2022-06-18 NOTE — Progress Notes (Addendum)
Date:  06/17/2022   Name:  Caroline Harris   DOB:  1945-02-20   MRN:  [id number]   Chief Complaint: Coronary Artery Disease  Coronary Artery Disease Presents for follow-up visit. Symptoms include shortness of breath. Pertinent negatives include no chest pain, chest pressure, chest tightness, dizziness, leg swelling, muscle weakness, palpitations or weight gain. The symptoms have been stable. Compliance with diet is good. Compliance with exercise is variable. Compliance with medications is variable.    Lab Results  Component Value Date   NA 140 03/28/2022   K 4.5 03/28/2022   CO2 25 03/28/2022   GLUCOSE 87 03/28/2022   BUN 14 03/28/2022   CREATININE 0.63 03/28/2022   CALCIUM 9.2 03/28/2022   EGFR 91 03/28/2022   GFRNONAA >60 03/31/2021   Lab Results  Component Value Date   CHOL 176 09/25/2021   HDL 84 09/25/2021   LDLCALC 77 09/25/2021   TRIG 85 09/25/2021   CHOLHDL 1.7 11/05/2018   Lab Results  Component Value Date   TSH 1.120 06/01/2017   No results found for: "HGBA1C" Lab Results  Component Value Date   WBC 8.0 09/25/2021   HGB 12.9 09/25/2021   HCT 39.1 09/25/2021   MCV 95 09/25/2021   PLT 289 09/25/2021   Lab Results  Component Value Date   ALT 21 05/28/2022   AST 26 05/28/2022   ALKPHOS 41 (L) 05/28/2022   BILITOT 0.3 05/28/2022   No results found for: "25OHVITD2", "25OHVITD3", "VD25OH"   Review of Systems  Constitutional:  Negative for weight gain.  Eyes:  Negative for photophobia and visual disturbance.  Respiratory:  Positive for shortness of breath. Negative for cough, chest tightness and wheezing.   Cardiovascular:  Negative for chest pain, palpitations and leg swelling.  Gastrointestinal:  Negative for abdominal distention and abdominal pain.  Musculoskeletal:  Negative for muscle weakness.  Neurological:  Negative for dizziness.    Patient Active Problem List   Diagnosis Date Noted   Other synovitis and tenosynovitis, unspecified hand  03/13/2021   Inflammatory pain 03/13/2021   Ganglion cyst of finger of right hand 06/25/2020   Pain in finger of right hand 06/25/2020   Aortic atherosclerosis (HCC) 11/05/2018   COPD, moderate (HCC) 08/23/2015   Asthma, mild intermittent 06/07/2015   Hyperlipidemia 06/07/2015    No Known Allergies  Past Surgical History:  Procedure Laterality Date   ABDOMINAL HYSTERECTOMY     CATARACT EXTRACTION W/PHACO Left 10/09/2021   Procedure: CATARACT EXTRACTION PHACO AND INTRAOCULAR LENS PLACEMENT (IOC) LEFT 8.38 01:09.7;  Surgeon: Lockie Mola, MD;  Location: [facility];  Service: Ophthalmology;  Laterality: Left;   CATARACT EXTRACTION W/PHACO Right 10/23/2021   Procedure: CATARACT EXTRACTION PHACO AND INTRAOCULAR LENS PLACEMENT (IOC) RIGHT 5.51 00:48.8;  Surgeon: Lockie Mola, MD;  Location: [facility];  Service: Ophthalmology;  Laterality: Right;   COLONOSCOPY     TONSILLECTOMY     TUBAL LIGATION      Social History   Tobacco Use   Smoking status: Every Day    Packs/day: 0.50    Years: 56.00    Additional pack years: 0.00    Total pack years: 28.00    Types: Cigarettes    Start date: 02/17/1958   Smokeless tobacco: Never   Tobacco comments:    aware needs to stop   Vaping Use   Vaping Use: Never used  Substance Use Topics   Alcohol use: Yes    Alcohol/week: 1.0 standard drink of alcohol  Types: 1 Glasses of wine per week   Drug use: No     Medication list has been reviewed and updated.  Current Meds  Medication Sig   budesonide (ENTOCORT EC) 3 MG 24 hr capsule Take 1 capsule by mouth every morning.   calcium carbonate 1250 MG capsule Take 1,250 mg by mouth 2 (two) times daily with a meal.   lovastatin (MEVACOR) 20 MG tablet TAKE (1) TABLET BY MOUTH EVERY DAY   Magnesium 400 MG TABS    Melatonin 10 MG TABS Take 1 tablet by mouth at bedtime.   montelukast (SINGULAIR) 10 MG tablet TAKE (1) TABLET BY MOUTH EVERY DAY   Multiple  Vitamins-Minerals (CENTRUM SILVER ADULT 50+ PO) Take 1 capsule by mouth daily at 6 (six) AM.       05/05/2022    2:37 PM 04/24/2022   10:49 AM 03/28/2022    9:15 AM 02/04/2022    2:40 PM  GAD 7 : Generalized Anxiety Score  Nervous, Anxious, on Edge 0 0 0 0  Control/stop worrying 0 0 0 0  Worry too much - different things 0 0 0 0  Trouble relaxing 0 0 0 0  Restless 0 0 0 0  Easily annoyed or irritable 0 0 0 0  Afraid - awful might happen 0 0 0 0  Total GAD 7 Score 0 0 0 0  Anxiety Difficulty Not difficult at all Not difficult at all Not difficult at all Not difficult at all       05/28/2022   10:05 AM 05/05/2022    2:36 PM 04/24/2022   10:49 AM  Depression screen PHQ 2/9  Decreased Interest 0 0 0  Down, Depressed, Hopeless 0 0 0  PHQ - 2 Score 0 0 0  Altered sleeping 0 0 0  Tired, decreased energy 0 0 0  Change in appetite 0 0 0  Feeling bad or failure about yourself  0 0 0  Trouble concentrating 0 0 0  Moving slowly or fidgety/restless 0 0 0  Suicidal thoughts 0 0 0  PHQ-9 Score 0 0 0  Difficult doing work/chores Not difficult at all Not difficult at all Not difficult at all    BP Readings from Last 3 Encounters:  06/17/22 136/88  05/05/22 128/76  04/24/22 126/84    Physical Exam Vitals and nursing note reviewed. Exam conducted with a chaperone present.  Constitutional:      General: She is not in acute distress.    Appearance: She is not diaphoretic.  HENT:     Head: Normocephalic and atraumatic.     Right Ear: Tympanic membrane and external ear normal.     Left Ear: Tympanic membrane and external ear normal.     Nose: No congestion or rhinorrhea.     Mouth/Throat:     Pharynx: No oropharyngeal exudate.  Neck:     Thyroid: No thyromegaly.     Vascular: No JVD.  Cardiovascular:     Rate and Rhythm: Normal rate and regular rhythm.     Heart sounds: Normal heart sounds. No murmur heard.    No friction rub. No gallop.  Pulmonary:     Effort: Pulmonary effort is  normal. No respiratory distress.     Breath sounds: Normal breath sounds. No stridor. No wheezing, rhonchi or rales.  Abdominal:     General: Abdomen is flat.     Palpations: Abdomen is soft. There is no mass.  Musculoskeletal:  General: Normal range of motion.     Cervical back: Neck supple.  Skin:    General: Skin is warm and dry.  Neurological:     Mental Status: She is alert.     Deep Tendon Reflexes: Reflexes are normal and symmetric.     Wt Readings from Last 3 Encounters:  06/17/22 155 lb (70.3 kg)  05/28/22 155 lb (70.3 kg)  05/05/22 155 lb (70.3 kg)    BP 136/88 (BP Location: Right Arm, Cuff Size: Large)   Pulse 74   Ht 5\' 7"  (1.702 m)   Wt 155 lb (70.3 kg)   SpO2 97%   BMI 24.28 kg/m   Assessment and Plan:  1. Atherosclerosis of native coronary artery of native heart without angina pectoris Chronic.  Persistent.  Stable.  Patient is status post low-dose CT scan of the chest which noted thoracic aortic moderate atherosclerotic disease and moderate coronary artery calcifications.  EKG with the following: Sinus rhythm rate 64 low voltage in precordial leads negative precordial T waves noted although probably normal there is some consideration for anterior septal ischemia.  Will consult cardiology if coronary calcium score is reasonable next step or we can treat the other areas of concern as noted below - EKG 12-Lead  2. Hyperlipidemia, unspecified hyperlipidemia type Review of patient's lipid management is excellent with LDL at 77 however if there is coronary artery disease we need to get this under 70 and may need to consider increasing a statin agent and that patient is currently on lovastatin 20 mg once a day..  3. Elevated blood pressure, situational Blood pressure is situational elevated but today is 136/88 which is in stage I.  Reemphasized avoiding sodium but further consideration may need to be made in terms of initiating a blood pressure lowering  agent.  4. Cigarette nicotine dependence without complication Patient has been advised of the health risks of smoking and counseled concerning cessation of tobacco products. I spent over 3 minutes for discussion and to answer questions.   5. Abnormal EKG Comparison of the above EKG to 2016 there is T wave inversion in the 1 but isoelectric and V2 V3 which may suggest more lead placement than progression.  We will discuss with cardiology whether coronary calcium score may need to be ordered.  I spent with this patient, More than 50% of that time was spent in face to face education, counseling and care coordination.   Elizabeth Sauer, MD

## 2022-06-20 ENCOUNTER — Telehealth: Payer: Self-pay | Admitting: Gastroenterology

## 2022-06-20 DIAGNOSIS — H40003 Preglaucoma, unspecified, bilateral: Secondary | ICD-10-CM | POA: Diagnosis not present

## 2022-06-20 DIAGNOSIS — Z961 Presence of intraocular lens: Secondary | ICD-10-CM | POA: Diagnosis not present

## 2022-06-20 DIAGNOSIS — H43813 Vitreous degeneration, bilateral: Secondary | ICD-10-CM | POA: Diagnosis not present

## 2022-06-20 MED ORDER — BUDESONIDE 3 MG PO CPEP: 3.0000 mg | ORAL_CAPSULE | Freq: Every morning | ORAL | 1 refills | Status: DC

## 2022-06-20 NOTE — Addendum Note (Signed)
Addended by: Radene Knee L on: 06/20/2022 09:42 AM   Modules accepted: Orders

## 2022-06-20 NOTE — Telephone Encounter (Signed)
Pt left message stated that she needs refills on her budesonide 3mg  sent into [facility] [place] pt would like a call back

## 2022-06-20 NOTE — Telephone Encounter (Signed)
Collagenous colitis: In clinical remission Continue budesonide 3 mg 1 pill daily Sent medication to the pharmacy

## 2022-06-30 DIAGNOSIS — R058 Other specified cough: Secondary | ICD-10-CM | POA: Diagnosis not present

## 2022-06-30 DIAGNOSIS — J449 Chronic obstructive pulmonary disease, unspecified: Secondary | ICD-10-CM | POA: Diagnosis not present

## 2022-09-15 ENCOUNTER — Encounter: Payer: Self-pay | Admitting: Family Medicine

## 2022-09-15 ENCOUNTER — Encounter: Payer: Self-pay | Admitting: Gastroenterology

## 2022-09-15 ENCOUNTER — Ambulatory Visit (INDEPENDENT_AMBULATORY_CARE_PROVIDER_SITE_OTHER): Payer: Medicare HMO | Admitting: Family Medicine

## 2022-09-15 VITALS — BP 118/78 | HR 76 | Ht 67.0 in | Wt 157.0 lb

## 2022-09-15 DIAGNOSIS — M545 Low back pain, unspecified: Secondary | ICD-10-CM

## 2022-09-15 DIAGNOSIS — M533 Sacrococcygeal disorders, not elsewhere classified: Secondary | ICD-10-CM

## 2022-09-15 LAB — POCT URINALYSIS DIPSTICK
Bilirubin, UA: NEGATIVE
Blood, UA: NEGATIVE
Glucose, UA: NEGATIVE
Ketones, UA: NEGATIVE
Leukocytes, UA: NEGATIVE
Nitrite, UA: NEGATIVE
Protein, UA: NEGATIVE
Spec Grav, UA: 1.01 (ref 1.010–1.025)
Urobilinogen, UA: 0.2 E.U./dL
pH, UA: 6 (ref 5.0–8.0)

## 2022-09-15 MED ORDER — PREDNISONE 10 MG PO TABS
10.0000 mg | ORAL_TABLET | Freq: Every day | ORAL | 0 refills | Status: DC
Start: 2022-09-15 — End: 2022-11-25

## 2022-09-15 MED ORDER — TRAMADOL HCL 50 MG PO TABS
50.0000 mg | ORAL_TABLET | Freq: Four times a day (QID) | ORAL | 0 refills | Status: AC
Start: 2022-09-15 — End: 2022-09-20

## 2022-09-15 NOTE — Patient Instructions (Signed)

## 2022-09-15 NOTE — Progress Notes (Signed)
Date:  09/15/2022   Name:  Caroline Harris   DOB:  10-May-1945   MRN:  [id number]   Chief Complaint: Back Pain (Lower back pain x 2 weeks- taking Tylenol because Dr Allegra Lai said not to take Ibuprofen)  Back Pain This is a new problem. The current episode started 1 to 4 weeks ago. The problem occurs constantly. The problem has been gradually worsening since onset. The pain is present in the sacro-iliac. The quality of the pain is described as aching. The pain does not radiate. The pain is at a severity of 7/10. The pain is moderate. The symptoms are aggravated by bending, twisting and sitting. Pertinent negatives include no chest pain, fever or numbness.    Lab Results  Component Value Date   NA 140 03/28/2022   K 4.5 03/28/2022   CO2 25 03/28/2022   GLUCOSE 87 03/28/2022   BUN 14 03/28/2022   CREATININE 0.63 03/28/2022   CALCIUM 9.2 03/28/2022   EGFR 91 03/28/2022   GFRNONAA >60 03/31/2021   Lab Results  Component Value Date   CHOL 176 09/25/2021   HDL 84 09/25/2021   LDLCALC 77 09/25/2021   TRIG 85 09/25/2021   CHOLHDL 1.7 11/05/2018   Lab Results  Component Value Date   TSH 1.120 06/01/2017   No results found for: "HGBA1C" Lab Results  Component Value Date   WBC 8.0 09/25/2021   HGB 12.9 09/25/2021   HCT 39.1 09/25/2021   MCV 95 09/25/2021   PLT 289 09/25/2021   Lab Results  Component Value Date   ALT 21 05/28/2022   AST 26 05/28/2022   ALKPHOS 41 (L) 05/28/2022   BILITOT 0.3 05/28/2022   No results found for: "25OHVITD2", "25OHVITD3", "VD25OH"   Review of Systems  Constitutional:  Negative for chills and fever.  Respiratory:  Negative for chest tightness, shortness of breath and wheezing.   Cardiovascular:  Negative for chest pain and palpitations.  Musculoskeletal:  Positive for arthralgias and back pain. Negative for myalgias.  Neurological:  Negative for numbness.    Patient Active Problem List   Diagnosis Date Noted   Other synovitis and  tenosynovitis, unspecified hand 03/13/2021   Inflammatory pain 03/13/2021   Ganglion cyst of finger of right hand 06/25/2020   Pain in finger of right hand 06/25/2020   Aortic atherosclerosis (HCC) 11/05/2018   COPD, moderate (HCC) 08/23/2015   Asthma, mild intermittent 06/07/2015   Hyperlipidemia 06/07/2015    No Known Allergies  Past Surgical History:  Procedure Laterality Date   ABDOMINAL HYSTERECTOMY     CATARACT EXTRACTION W/PHACO Left 10/09/2021   Procedure: CATARACT EXTRACTION PHACO AND INTRAOCULAR LENS PLACEMENT (IOC) LEFT 8.38 01:09.7;  Surgeon: Lockie Mola, MD;  Location: [facility];  Service: Ophthalmology;  Laterality: Left;   CATARACT EXTRACTION W/PHACO Right 10/23/2021   Procedure: CATARACT EXTRACTION PHACO AND INTRAOCULAR LENS PLACEMENT (IOC) RIGHT 5.51 00:48.8;  Surgeon: Lockie Mola, MD;  Location: [facility];  Service: Ophthalmology;  Laterality: Right;   COLONOSCOPY     TONSILLECTOMY     TUBAL LIGATION      Social History   Tobacco Use   Smoking status: Every Day    Current packs/day: 0.50    Average packs/day: 0.5 packs/day for 64.6 years (32.3 ttl pk-yrs)    Types: Cigarettes    Start date: 02/17/1958   Smokeless tobacco: Never   Tobacco comments:    aware needs to stop   Vaping Use   Vaping status:  Never Used  Substance Use Topics   Alcohol use: Yes    Alcohol/week: 1.0 standard drink of alcohol    Types: 1 Glasses of wine per week   Drug use: No     Medication list has been reviewed and updated.  Current Meds  Medication Sig   budesonide (ENTOCORT EC) 3 MG 24 hr capsule Take 1 capsule (3 mg total) by mouth every morning.   calcium carbonate 1250 MG capsule Take 1,250 mg by mouth 2 (two) times daily with a meal.   lovastatin (MEVACOR) 20 MG tablet TAKE (1) TABLET BY MOUTH EVERY DAY   Magnesium 400 MG TABS    Melatonin 10 MG TABS Take 1 tablet by mouth at bedtime.   montelukast (SINGULAIR) 10 MG tablet TAKE (1)  TABLET BY MOUTH EVERY DAY   Multiple Vitamins-Minerals (CENTRUM SILVER ADULT 50+ PO) Take 1 capsule by mouth daily at 6 (six) AM.       09/15/2022   11:25 AM 05/05/2022    2:37 PM 04/24/2022   10:49 AM 03/28/2022    9:15 AM  GAD 7 : Generalized Anxiety Score  Nervous, Anxious, on Edge 0 0 0 0  Control/stop worrying 0 0 0 0  Worry too much - different things 0 0 0 0  Trouble relaxing 0 0 0 0  Restless 0 0 0 0  Easily annoyed or irritable 0 0 0 0  Afraid - awful might happen 0 0 0 0  Total GAD 7 Score 0 0 0 0  Anxiety Difficulty Not difficult at all Not difficult at all Not difficult at all Not difficult at all       09/15/2022   11:25 AM 05/28/2022   10:05 AM 05/05/2022    2:36 PM  Depression screen PHQ 2/9  Decreased Interest 0 0 0  Down, Depressed, Hopeless 0 0 0  PHQ - 2 Score 0 0 0  Altered sleeping 0 0 0  Tired, decreased energy 0 0 0  Change in appetite 0 0 0  Feeling bad or failure about yourself  0 0 0  Trouble concentrating 0 0 0  Moving slowly or fidgety/restless 0 0 0  Suicidal thoughts 0 0 0  PHQ-9 Score 0 0 0  Difficult doing work/chores Not difficult at all Not difficult at all Not difficult at all    BP Readings from Last 3 Encounters:  09/15/22 118/78  06/17/22 136/88  05/05/22 128/76    Physical Exam Vitals and nursing note reviewed. Exam conducted with a chaperone present.  Constitutional:      General: She is not in acute distress.    Appearance: She is not diaphoretic.  HENT:     Head: Normocephalic and atraumatic.     Right Ear: External ear normal.     Left Ear: External ear normal.     Nose: Nose normal.  Eyes:     General:        Right eye: No discharge.        Left eye: No discharge.     Conjunctiva/sclera: Conjunctivae normal.     Pupils: Pupils are equal, round, and reactive to light.  Neck:     Thyroid: No thyromegaly.     Vascular: No JVD.  Cardiovascular:     Rate and Rhythm: Normal rate and regular rhythm.     Heart sounds:  Normal heart sounds. No murmur heard.    No friction rub. No gallop.  Pulmonary:     Effort:  Pulmonary effort is normal. No respiratory distress.     Breath sounds: Normal breath sounds. No stridor. No wheezing, rhonchi or rales.  Chest:     Chest wall: No tenderness.  Abdominal:     General: Bowel sounds are normal.     Palpations: Abdomen is soft. There is no mass.     Tenderness: There is no abdominal tenderness. There is no guarding.  Musculoskeletal:        General: Normal range of motion.     Cervical back: Normal range of motion and neck supple.     Right hip: Bony tenderness present.     Left hip: Bony tenderness present.     Comments: Tenderness bilateral L>R sacroileac jt  Lymphadenopathy:     Cervical: No cervical adenopathy.  Skin:    General: Skin is warm and dry.  Neurological:     Mental Status: She is alert.     Deep Tendon Reflexes: Reflexes are normal and symmetric.     Wt Readings from Last 3 Encounters:  09/15/22 157 lb (71.2 kg)  06/17/22 155 lb (70.3 kg)  05/28/22 155 lb (70.3 kg)    BP 118/78   Pulse 76   Ht 5\' 7"  (1.702 m)   Wt 157 lb (71.2 kg)   SpO2 98%   BMI 24.59 kg/m   Assessment and Plan:  1. Back pain at L4-L5 level New onset.  Progressively worsened over 2 weeks the worst pain began yesterday.  Patient has point tenderness particularly of the left SI joint with some over the right.  This is consistent with more of a sacroiliac than lower back but we did urinalysis to rule out a urinary concern. - POCT urinalysis dipstick  2. Sacroiliac dysfunction In addition to prednisone taper 40 mg over 12 days patient is also being given tramadol to take 1-2 a day for additional pain relief. - traMADol (ULTRAM) 50 MG tablet; Take 1 tablet (50 mg total) by mouth 4 (four) times daily for 5 days.  Dispense: 20 tablet; Refill: 0     Elizabeth Sauer, MD

## 2022-09-22 ENCOUNTER — Encounter: Payer: Self-pay | Admitting: Family Medicine

## 2022-09-22 ENCOUNTER — Ambulatory Visit (INDEPENDENT_AMBULATORY_CARE_PROVIDER_SITE_OTHER): Payer: Medicare HMO | Admitting: Family Medicine

## 2022-09-22 VITALS — BP 128/72 | HR 57 | Ht 67.0 in | Wt 158.0 lb

## 2022-09-22 DIAGNOSIS — E785 Hyperlipidemia, unspecified: Secondary | ICD-10-CM

## 2022-09-22 DIAGNOSIS — J4520 Mild intermittent asthma, uncomplicated: Secondary | ICD-10-CM

## 2022-09-22 MED ORDER — MONTELUKAST SODIUM 10 MG PO TABS: ORAL_TABLET | ORAL | 1 refills | Status: DC

## 2022-09-22 MED ORDER — LOVASTATIN 20 MG PO TABS: ORAL_TABLET | ORAL | 1 refills | Status: DC

## 2022-09-22 NOTE — Progress Notes (Signed)
Date:  09/22/2022   Name:  Caroline Harris   DOB:  1945/05/13   MRN:  [id number]   Chief Complaint: Hyperlipidemia and Allergic Rhinitis   Hyperlipidemia This is a chronic problem. The current episode started more than 1 year ago. The problem is controlled. Recent lipid tests were reviewed and are normal. She has no history of chronic renal disease, diabetes, hypothyroidism, liver disease, obesity or nephrotic syndrome. There are no known factors aggravating her hyperlipidemia. Pertinent negatives include no chest pain, focal sensory loss, focal weakness, leg pain, myalgias or shortness of breath. Current antihyperlipidemic treatment includes statins. The current treatment provides moderate improvement of lipids. There are no compliance problems.  There are no known risk factors for coronary artery disease.  URI  This is a chronic problem. The current episode started more than 1 year ago. The problem has been gradually improving. There has been no fever. Pertinent negatives include no abdominal pain, chest pain, diarrhea, rhinorrhea, sore throat or wheezing.  Asthma There is no chest tightness, difficulty breathing, hemoptysis, hoarse voice, shortness of breath or wheezing. This is a chronic problem. The current episode started more than 1 year ago. The problem occurs intermittently. The problem has been waxing and waning. Pertinent negatives include no chest pain, ear congestion, myalgias, rhinorrhea or sore throat. Her past medical history is significant for asthma.    Lab Results  Component Value Date   NA 140 03/28/2022   K 4.5 03/28/2022   CO2 25 03/28/2022   GLUCOSE 87 03/28/2022   BUN 14 03/28/2022   CREATININE 0.63 03/28/2022   CALCIUM 9.2 03/28/2022   EGFR 91 03/28/2022   GFRNONAA >60 03/31/2021   Lab Results  Component Value Date   CHOL 176 09/25/2021   HDL 84 09/25/2021   LDLCALC 77 09/25/2021   TRIG 85 09/25/2021   CHOLHDL 1.7 11/05/2018   Lab Results  Component Value  Date   TSH 1.120 06/01/2017   No results found for: "HGBA1C" Lab Results  Component Value Date   WBC 8.0 09/25/2021   HGB 12.9 09/25/2021   HCT 39.1 09/25/2021   MCV 95 09/25/2021   PLT 289 09/25/2021   Lab Results  Component Value Date   ALT 21 05/28/2022   AST 26 05/28/2022   ALKPHOS 41 (L) 05/28/2022   BILITOT 0.3 05/28/2022   No results found for: "25OHVITD2", "25OHVITD3", "VD25OH"   Review of Systems  HENT:  Negative for hoarse voice, rhinorrhea, sore throat and voice change.   Respiratory:  Negative for hemoptysis, shortness of breath and wheezing.   Cardiovascular:  Negative for chest pain.  Gastrointestinal:  Negative for abdominal pain, blood in stool and diarrhea.  Endocrine: Negative for polydipsia and polyuria.  Genitourinary:  Negative for difficulty urinating.  Musculoskeletal:  Negative for myalgias.  Neurological:  Negative for focal weakness.    Patient Active Problem List   Diagnosis Date Noted   Other synovitis and tenosynovitis, unspecified hand 03/13/2021   Inflammatory pain 03/13/2021   Ganglion cyst of finger of right hand 06/25/2020   Pain in finger of right hand 06/25/2020   Aortic atherosclerosis (HCC) 11/05/2018   COPD, moderate (HCC) 08/23/2015   Asthma, mild intermittent 06/07/2015   Hyperlipidemia 06/07/2015    No Known Allergies  Past Surgical History:  Procedure Laterality Date   ABDOMINAL HYSTERECTOMY     CATARACT EXTRACTION W/PHACO Left 10/09/2021   Procedure: CATARACT EXTRACTION PHACO AND INTRAOCULAR LENS PLACEMENT (IOC) LEFT 8.38 01:09.7;  Surgeon: Inez Pilgrim,  Radene Knee, MD;  Location: [facility];  Service: Ophthalmology;  Laterality: Left;   CATARACT EXTRACTION W/PHACO Right 10/23/2021   Procedure: CATARACT EXTRACTION PHACO AND INTRAOCULAR LENS PLACEMENT (IOC) RIGHT 5.51 00:48.8;  Surgeon: Lockie Mola, MD;  Location: [facility];  Service: Ophthalmology;  Laterality: Right;   COLONOSCOPY     TONSILLECTOMY      TUBAL LIGATION      Social History   Tobacco Use   Smoking status: Every Day    Current packs/day: 0.50    Average packs/day: 0.5 packs/day for 64.6 years (32.3 ttl pk-yrs)    Types: Cigarettes    Start date: 02/17/1958   Smokeless tobacco: Never   Tobacco comments:    aware needs to stop   Vaping Use   Vaping status: Never Used  Substance Use Topics   Alcohol use: Yes    Alcohol/week: 1.0 standard drink of alcohol    Types: 1 Glasses of wine per week   Drug use: No     Medication list has been reviewed and updated.  Current Meds  Medication Sig   budesonide (ENTOCORT EC) 3 MG 24 hr capsule Take 1 capsule (3 mg total) by mouth every morning.   calcium carbonate 1250 MG capsule Take 1,250 mg by mouth 2 (two) times daily with a meal.   lovastatin (MEVACOR) 20 MG tablet TAKE (1) TABLET BY MOUTH EVERY DAY   Magnesium 400 MG TABS    Melatonin 10 MG TABS Take 1 tablet by mouth at bedtime.   montelukast (SINGULAIR) 10 MG tablet TAKE (1) TABLET BY MOUTH EVERY DAY   Multiple Vitamins-Minerals (CENTRUM SILVER ADULT 50+ PO) Take 1 capsule by mouth daily at 6 (six) AM.   predniSONE (DELTASONE) 10 MG tablet Take 1 tablet (10 mg total) by mouth daily. Taper 4,4,4,3,3,3,2,2,2,1,1,1       09/22/2022    8:33 AM 09/15/2022   11:25 AM 05/05/2022    2:37 PM 04/24/2022   10:49 AM  GAD 7 : Generalized Anxiety Score  Nervous, Anxious, on Edge 0 0 0 0  Control/stop worrying 0 0 0 0  Worry too much - different things 0 0 0 0  Trouble relaxing 0 0 0 0  Restless 0 0 0 0  Easily annoyed or irritable 0 0 0 0  Afraid - awful might happen 0 0 0 0  Total GAD 7 Score 0 0 0 0  Anxiety Difficulty Not difficult at all Not difficult at all Not difficult at all Not difficult at all       09/22/2022    8:33 AM 09/15/2022   11:25 AM 05/28/2022   10:05 AM  Depression screen PHQ 2/9  Decreased Interest 0 0 0  Down, Depressed, Hopeless 0 0 0  PHQ - 2 Score 0 0 0  Altered sleeping 0 0 0  Tired,  decreased energy 0 0 0  Change in appetite 0 0 0  Feeling bad or failure about yourself  0 0 0  Trouble concentrating 0 0 0  Moving slowly or fidgety/restless 0 0 0  Suicidal thoughts 0 0 0  PHQ-9 Score 0 0 0  Difficult doing work/chores Not difficult at all Not difficult at all Not difficult at all    BP Readings from Last 3 Encounters:  09/22/22 128/72  09/15/22 118/78  06/17/22 136/88    Physical Exam Vitals and nursing note reviewed. Exam conducted with a chaperone present.  Constitutional:      General: She is  not in acute distress.    Appearance: She is not diaphoretic.  HENT:     Head: Normocephalic and atraumatic.     Right Ear: Tympanic membrane and external ear normal.     Left Ear: Tympanic membrane and external ear normal.     Nose: Nose normal. No congestion or rhinorrhea.  Eyes:     General:        Right eye: No discharge.        Left eye: No discharge.     Conjunctiva/sclera: Conjunctivae normal.     Pupils: Pupils are equal, round, and reactive to light.  Cardiovascular:     Rate and Rhythm: Normal rate and regular rhythm.     Heart sounds: Normal heart sounds. No murmur heard.    No friction rub. No gallop.  Pulmonary:     Effort: Pulmonary effort is normal.     Breath sounds: Normal breath sounds. No wheezing, rhonchi or rales.  Abdominal:     General: Bowel sounds are normal.     Palpations: Abdomen is soft. There is no mass.     Tenderness: There is no abdominal tenderness. There is no guarding or rebound.  Musculoskeletal:        General: Normal range of motion.     Cervical back: Neck supple.  Skin:    General: Skin is warm and dry.  Neurological:     Mental Status: She is alert.     Wt Readings from Last 3 Encounters:  09/22/22 158 lb (71.7 kg)  09/15/22 157 lb (71.2 kg)  06/17/22 155 lb (70.3 kg)    BP 128/72   Pulse (!) 57   Ht 5\' 7"  (1.702 m)   Wt 158 lb (71.7 kg)   SpO2 96%   BMI 24.75 kg/m   Assessment and Plan:  1.  Hyperlipidemia, unspecified hyperlipidemia type Chronic.  Controlled.  Stable.  Asymptomatic.  Tolerating medication well.  Continue lovastatin 20 mg once a day.  Will check lipid panel for LDL control and renal function panel for electrolytes. - lovastatin (MEVACOR) 20 MG tablet; TAKE (1) TABLET BY MOUTH EVERY DAY  Dispense: 90 tablet; Refill: 1 - Lipid Panel With LDL/HDL Ratio - Renal Function Panel  2. Mild intermittent asthma, unspecified whether complicated Chronic.  Controlled.  Mild.  Intermittent.  Currently controlled with montelukast 10 mg once a day and albuterol as needed.  She is also followed by pulmonary/Dr. Meredeth Ide and is on long acting bronchodilation with Breztri. - montelukast (SINGULAIR) 10 MG tablet; TAKE (1) TABLET BY MOUTH EVERY DAY  Dispense: 90 tablet; Refill: 1    Elizabeth Sauer, MD

## 2022-09-26 ENCOUNTER — Ambulatory Visit: Payer: PPO | Admitting: Family Medicine

## 2022-10-06 DIAGNOSIS — G4452 New daily persistent headache (NDPH): Secondary | ICD-10-CM | POA: Diagnosis not present

## 2022-10-07 ENCOUNTER — Other Ambulatory Visit: Payer: Self-pay | Admitting: Student

## 2022-10-07 DIAGNOSIS — G4452 New daily persistent headache (NDPH): Secondary | ICD-10-CM

## 2022-10-22 ENCOUNTER — Encounter: Payer: Self-pay | Admitting: Student

## 2022-10-25 ENCOUNTER — Ambulatory Visit
Admission: RE | Admit: 2022-10-25 | Discharge: 2022-10-25 | Disposition: A | Discharge status: Home / Self Care | Payer: Medicare HMO | Source: Ambulatory Visit | Attending: Student | Admitting: Student

## 2022-10-25 DIAGNOSIS — R519 Headache, unspecified: Secondary | ICD-10-CM | POA: Diagnosis not present

## 2022-10-25 DIAGNOSIS — G4452 New daily persistent headache (NDPH): Secondary | ICD-10-CM

## 2022-10-28 DIAGNOSIS — Z8673 Personal history of transient ischemic attack (TIA), and cerebral infarction without residual deficits: Secondary | ICD-10-CM | POA: Diagnosis not present

## 2022-10-28 DIAGNOSIS — G4452 New daily persistent headache (NDPH): Secondary | ICD-10-CM | POA: Diagnosis not present

## 2022-11-10 ENCOUNTER — Telehealth: Payer: Self-pay

## 2022-11-10 NOTE — Telephone Encounter (Signed)
Patient is having abdominal cramping that comes and goes through out the day. She states the pain feels like gas pain but is made worse by pushing. She states the symptoms sound like she is constipated but she does not feel like she is. She states the pain started around 4 days ago and she states the pain starts right when she gets up before she even brushes her teeth. She states she has abdominal cramping and feels gassy in the lower abdominal area of stomach. The pain is a 6 or 7 on the pain scale. She states she will have to go sit on the toilet and she sits there and push till she has a small soft bowel movement. The pain will go away after the bowel movement. She states this will happen 4 to 5 times a day and will have small bowel movements each time that are soft. She denies any rectal bleeding nausea or vomiting. She states she wanted to see Dr. Allegra Lai this week informed her you did not have anything. She is due for her yearly exam with you because she states she is still taking the Budesonide and she was last seen on 10/24/2021. Made appointment for patient for 12/02/2022

## 2022-11-10 NOTE — Telephone Encounter (Signed)
I see that she is taking calcium pills 2 times daily which can also lead to constipation Check with her if she is also taking magnesium Advise to hold budesonide and see if it helps Recommend colonoscopy if patient is agreeable as she is about due for her surveillance colonoscopy  RV

## 2022-11-11 ENCOUNTER — Other Ambulatory Visit: Payer: Self-pay

## 2022-11-11 DIAGNOSIS — Z86010 Personal history of colonic polyps: Secondary | ICD-10-CM

## 2022-11-11 MED ORDER — NA SULFATE-K SULFATE-MG SULF 17.5-3.13-1.6 GM/177ML PO SOLN: 354.0000 mL | Freq: Once | ORAL | 0 refills | Status: AC

## 2022-11-11 NOTE — Telephone Encounter (Signed)
Called patient and got patient schedule for 11/25/2022 for a colonoscopy. Went over instructions, mailed them and sent to [employer]. She will stop taking the Budesonide. She states she is still taking calcium 2 times daily. She is also taking magnesium

## 2022-11-11 NOTE — Addendum Note (Signed)
Addended by: Radene Knee L on: 11/11/2022 08:15 AM   Modules accepted: Orders

## 2022-11-11 NOTE — Telephone Encounter (Signed)
Patient verbalized understanding of instructions

## 2022-11-11 NOTE — Telephone Encounter (Signed)
Patient is wanting to know what she can take for the gas pain because when they hit she has to bend over they hurt so bad. She has not tried anything over the counter yet but is willing to trying anything you suggest.

## 2022-11-11 NOTE — Telephone Encounter (Signed)
Try over-the-counter Pepcid or simethicone as needed  RV

## 2022-11-11 NOTE — Telephone Encounter (Signed)
Called and patient verbalized understanding of instructions

## 2022-11-17 ENCOUNTER — Encounter: Payer: Self-pay | Admitting: Gastroenterology

## 2022-11-17 NOTE — Anesthesia Preprocedure Evaluation (Addendum)
Anesthesia Evaluation  Patient identified by MRN, date of birth, ID band Patient awake    Reviewed: Allergy & Precautions, H&P , NPO status , Patient's Chart, lab work & pertinent test results  Airway Mallampati: III       Dental  (+) Partial Lower, Caps Multiple veneers, caps, crowns:   Pulmonary asthma , COPD, Current Smoker and Patient abstained from smoking.          Cardiovascular + CAD    Moderate coronary artery calcifications, recommend ASCVD risk assessment. Per CT chest 06-10-22     Neuro/Psych  Headaches, Seizures -,   history of left sided occipital neuralgia History left temporal lobe infarct  negative psych ROS   GI/Hepatic negative GI ROS, Neg liver ROS,,,  Endo/Other  negative endocrine ROS    Renal/GU negative Renal ROS  negative genitourinary   Musculoskeletal  (+) Arthritis ,    Abdominal   Peds negative pediatric ROS (+)  Hematology negative hematology ROS (+)   Anesthesia Other Findings Hyperlipidemia Restless leg syndrome Diverticulitis Osteopenia Collagenous colitis  Colitis Diverticulitis  Arthritis Cataract  Febrile seizure Wears dentures  COPD, moderate  Headache  Cigarette smoker History of left temporal lobe infarct   Reproductive/Obstetrics negative OB ROS                             Anesthesia Physical Anesthesia Plan  ASA: 3  Anesthesia Plan: General   Post-op Pain Management:    Induction: Intravenous  PONV Risk Score and Plan:   Airway Management Planned: Natural Airway and Nasal Cannula  Additional Equipment:   Intra-op Plan:   Post-operative Plan:   Informed Consent: I have reviewed the patients History and Physical, chart, labs and discussed the procedure including the risks, benefits and alternatives for the proposed anesthesia with the patient or authorized representative who has indicated his/her understanding and  acceptance.     Dental Advisory Given  Plan Discussed with: Anesthesiologist, CRNA and Surgeon  Anesthesia Plan Comments: (Patient consented for risks of anesthesia including but not limited to:  - adverse reactions to medications - risk of airway placement if required - damage to eyes, teeth, lips or other oral mucosa - nerve damage due to positioning  - sore throat or hoarseness - Damage to heart, brain, nerves, lungs, other parts of body or loss of life  Patient voiced understanding.)       Anesthesia Quick Evaluation

## 2022-11-19 ENCOUNTER — Telehealth: Payer: Self-pay | Admitting: Family Medicine

## 2022-11-19 NOTE — Telephone Encounter (Signed)
Copied from CRM [id number]. Topic: General - Other >> Nov 19, 2022  8:51 AM Everette C wrote: Reason for CRM: The patient would like to be contacted by a member of administrative staff when possible regarding ongoing discussions related to paperwork   Please contact further when available

## 2022-11-19 NOTE — Telephone Encounter (Signed)
Patient had something to bring in for [place], CMA. Explained she can drop this off at the office, and we will give this to Dr Yetta Barre to give to [place].  - Cambre Matson

## 2022-11-25 ENCOUNTER — Ambulatory Visit: Payer: Medicare HMO | Admitting: Anesthesiology

## 2022-11-25 ENCOUNTER — Encounter: Payer: Self-pay | Admitting: Gastroenterology

## 2022-11-25 ENCOUNTER — Other Ambulatory Visit: Payer: Self-pay

## 2022-11-25 ENCOUNTER — Ambulatory Visit
Admission: RE | Admit: 2022-11-25 | Discharge: 2022-11-25 | Disposition: A | Discharge status: Home / Self Care | Payer: Medicare HMO | Source: Ambulatory Visit | Attending: Gastroenterology | Admitting: Gastroenterology

## 2022-11-25 ENCOUNTER — Encounter
Admission: RE | Disposition: A | Discharge status: Home / Self Care | Payer: Self-pay | Source: Ambulatory Visit | Attending: Gastroenterology

## 2022-11-25 DIAGNOSIS — F17210 Nicotine dependence, cigarettes, uncomplicated: Secondary | ICD-10-CM | POA: Insufficient documentation

## 2022-11-25 DIAGNOSIS — Z860100 Personal history of colon polyps, unspecified: Secondary | ICD-10-CM | POA: Diagnosis not present

## 2022-11-25 DIAGNOSIS — Z09 Encounter for follow-up examination after completed treatment for conditions other than malignant neoplasm: Secondary | ICD-10-CM | POA: Diagnosis not present

## 2022-11-25 DIAGNOSIS — G2581 Restless legs syndrome: Secondary | ICD-10-CM | POA: Insufficient documentation

## 2022-11-25 DIAGNOSIS — J449 Chronic obstructive pulmonary disease, unspecified: Secondary | ICD-10-CM | POA: Insufficient documentation

## 2022-11-25 DIAGNOSIS — K5730 Diverticulosis of large intestine without perforation or abscess without bleeding: Secondary | ICD-10-CM | POA: Diagnosis not present

## 2022-11-25 DIAGNOSIS — D128 Benign neoplasm of rectum: Secondary | ICD-10-CM | POA: Insufficient documentation

## 2022-11-25 DIAGNOSIS — M8580 Other specified disorders of bone density and structure, unspecified site: Secondary | ICD-10-CM | POA: Diagnosis not present

## 2022-11-25 DIAGNOSIS — E785 Hyperlipidemia, unspecified: Secondary | ICD-10-CM | POA: Insufficient documentation

## 2022-11-25 DIAGNOSIS — K621 Rectal polyp: Secondary | ICD-10-CM

## 2022-11-25 DIAGNOSIS — Z1211 Encounter for screening for malignant neoplasm of colon: Secondary | ICD-10-CM | POA: Insufficient documentation

## 2022-11-25 DIAGNOSIS — R519 Headache, unspecified: Secondary | ICD-10-CM | POA: Insufficient documentation

## 2022-11-25 DIAGNOSIS — K635 Polyp of colon: Secondary | ICD-10-CM | POA: Diagnosis not present

## 2022-11-25 DIAGNOSIS — I2510 Atherosclerotic heart disease of native coronary artery without angina pectoris: Secondary | ICD-10-CM | POA: Insufficient documentation

## 2022-11-25 DIAGNOSIS — M5481 Occipital neuralgia: Secondary | ICD-10-CM | POA: Diagnosis not present

## 2022-11-25 DIAGNOSIS — D120 Benign neoplasm of cecum: Secondary | ICD-10-CM | POA: Diagnosis not present

## 2022-11-25 DIAGNOSIS — R569 Unspecified convulsions: Secondary | ICD-10-CM | POA: Diagnosis not present

## 2022-11-25 DIAGNOSIS — M1990 Unspecified osteoarthritis, unspecified site: Secondary | ICD-10-CM | POA: Insufficient documentation

## 2022-11-25 HISTORY — PX: COLONOSCOPY WITH PROPOFOL: SHX5780

## 2022-11-25 HISTORY — DX: Personal history of transient ischemic attack (TIA), and cerebral infarction without residual deficits: Z86.73

## 2022-11-25 HISTORY — DX: Atherosclerosis of aorta: I70.0

## 2022-11-25 HISTORY — DX: New daily persistent headache (ndph): G44.52

## 2022-11-25 HISTORY — DX: Mild intermittent asthma, uncomplicated: J45.20

## 2022-11-25 HISTORY — DX: Atherosclerotic heart disease of native coronary artery without angina pectoris: I25.10

## 2022-11-25 HISTORY — PX: POLYPECTOMY: SHX5525

## 2022-11-25 HISTORY — DX: Headache, unspecified: R51.9

## 2022-11-25 SURGERY — COLONOSCOPY WITH PROPOFOL
Anesthesia: General | Site: Rectum

## 2022-11-25 MED ORDER — LIDOCAINE HCL (PF) 2 % IJ SOLN
INTRAMUSCULAR | Status: AC
  Filled 2022-11-25: qty 5

## 2022-11-25 MED ORDER — LACTATED RINGERS IV SOLN: INTRAVENOUS | Status: DC

## 2022-11-25 MED ORDER — PROPOFOL 10 MG/ML IV BOLUS
INTRAVENOUS | Status: DC | PRN
  Administered 2022-11-25 (×2): 20 mg via INTRAVENOUS
  Administered 2022-11-25: 30 mg via INTRAVENOUS
  Administered 2022-11-25 (×5): 20 mg via INTRAVENOUS
  Administered 2022-11-25: 30 mg via INTRAVENOUS
  Administered 2022-11-25 (×3): 20 mg via INTRAVENOUS
  Administered 2022-11-25: 80 mg via INTRAVENOUS
  Administered 2022-11-25: 20 mg via INTRAVENOUS
  Administered 2022-11-25: 30 mg via INTRAVENOUS
  Administered 2022-11-25 (×5): 20 mg via INTRAVENOUS
  Administered 2022-11-25: 30 mg via INTRAVENOUS

## 2022-11-25 MED ORDER — GLYCOPYRROLATE 0.2 MG/ML IJ SOLN
INTRAMUSCULAR | Status: AC
  Filled 2022-11-25: qty 1

## 2022-11-25 MED ORDER — PROPOFOL 10 MG/ML IV BOLUS
INTRAVENOUS | Status: AC
  Filled 2022-11-25: qty 40

## 2022-11-25 MED ORDER — EPHEDRINE SULFATE (PRESSORS) 50 MG/ML IJ SOLN
INTRAMUSCULAR | Status: DC | PRN
Start: 2022-11-25 — End: 2022-11-25
  Administered 2022-11-25: 5 mg via INTRAVENOUS

## 2022-11-25 MED ORDER — STERILE WATER FOR IRRIGATION IR SOLN
Status: DC | PRN
  Administered 2022-11-25: 1

## 2022-11-25 MED ORDER — SODIUM CHLORIDE 0.9% FLUSH
10.0000 mL | INTRAVENOUS | Status: DC | PRN
  Administered 2022-11-25 (×5): 10 mL via INTRAVENOUS

## 2022-11-25 MED ORDER — STERILE WATER FOR IRRIGATION IR SOLN
Status: DC | PRN
  Administered 2022-11-25: 300 mL

## 2022-11-25 MED ORDER — LIDOCAINE HCL (CARDIAC) PF 100 MG/5ML IV SOSY
PREFILLED_SYRINGE | INTRAVENOUS | Status: DC | PRN
  Administered 2022-11-25: 60 mg via INTRAVENOUS

## 2022-11-25 MED ORDER — SODIUM CHLORIDE 0.9 % IV SOLN: INTRAVENOUS | Status: DC

## 2022-11-25 MED ORDER — LIDOCAINE HCL (PF) 2 % IJ SOLN
INTRAMUSCULAR | Status: AC
  Filled 2022-11-25: qty 10

## 2022-11-25 MED ORDER — EPHEDRINE 5 MG/ML INJ
INTRAVENOUS | Status: AC
  Filled 2022-11-25: qty 5

## 2022-11-25 SURGICAL SUPPLY — 27 items
CLIP HMST 235XBRD CATH ROT (MISCELLANEOUS) IMPLANT
CLIP RESOLUTION 360 11X235 (MISCELLANEOUS) ×18
ELECT REM PT RETURN 9FT ADLT (ELECTROSURGICAL) ×2
ELECTRODE REM PT RTRN 9FT ADLT (ELECTROSURGICAL) IMPLANT
FCP ESCP3.2XJMB 240X2.8X (MISCELLANEOUS)
FORCEPS BIOP RAD 4 LRG CAP 4 (CUTTING FORCEPS) IMPLANT
FORCEPS BIOP RJ4 240 W/NDL (MISCELLANEOUS)
FORCEPS ESCP3.2XJMB 240X2.8X (MISCELLANEOUS) IMPLANT
GOWN CVR UNV OPN BCK APRN NK (MISCELLANEOUS) ×4 IMPLANT
GOWN ISOL THUMB LOOP REG UNIV (MISCELLANEOUS) ×4
INJECTABLE ELEVIEW COMP 10 (MISCELLANEOUS) IMPLANT
INJECTOR VARIJECT VIN23 (MISCELLANEOUS) IMPLANT
KIT DEFENDO VALVE AND CONN (KITS) IMPLANT
KIT PRC NS LF DISP ENDO (KITS) ×2 IMPLANT
KIT PROCEDURE OLYMPUS (KITS) ×2
MANIFOLD NEPTUNE II (INSTRUMENTS) ×2 IMPLANT
MARKER SPOT ENDO TATTOO 5ML (MISCELLANEOUS) IMPLANT
PROBE APC STR FIRE (PROBE) IMPLANT
RETRIEVER NET ROTH 2.5X230 LF (MISCELLANEOUS) IMPLANT
SNARE COLD EXACTO (MISCELLANEOUS) IMPLANT
SNARE LASSO HEX 3 IN 1 (INSTRUMENTS) IMPLANT
SNARE SHORT THROW 13M SML OVAL (MISCELLANEOUS) IMPLANT
SNARE SHORT THROW 30M LRG OVAL (MISCELLANEOUS) IMPLANT
SNARE SNG USE RND 15MM (INSTRUMENTS) IMPLANT
TRAP ETRAP POLY (MISCELLANEOUS) IMPLANT
VARIJECT INJECTOR VIN23 (MISCELLANEOUS) ×2
WATER STERILE IRR 250ML POUR (IV SOLUTION) ×2 IMPLANT

## 2022-11-25 NOTE — Transfer of Care (Signed)
Immediate Anesthesia Transfer of Care Note  Patient: Caroline Harris  Procedure(s) Performed: COLONOSCOPY WITH PROPOFOL (Rectum) POLYPECTOMY (Rectum)  Patient Location: PACU  Anesthesia Type: General  Level of Consciousness: awake, alert  and patient cooperative  Airway and Oxygen Therapy: Patient Spontanous Breathing and Patient connected to supplemental oxygen  Post-op Assessment: Post-op Vital signs reviewed, Patient's Cardiovascular Status Stable, Respiratory Function Stable, Patent Airway and No signs of Nausea or vomiting  Post-op Vital Signs: Reviewed and stable  Complications: No notable events documented.

## 2022-11-25 NOTE — H&P (Signed)
Arlyss Repress, MD [address]  [address]  [place], [place] [address]  Main: [phone]  Fax: [phone] Pager: [phone]  Primary Care Physician:  Duanne Limerick, MD Primary Gastroenterologist:  Dr. Arlyss Repress  Pre-Procedure History & Physical: HPI:  Caroline Harris is a 77 y.o. female is here for an colonoscopy.   Past Medical History:  Diagnosis Date   Aortic atherosclerosis (HCC)    Arthritis    In fingers   CAD (coronary artery disease)    Cataract March, 2023   Will have removed   Colitis    Collagenous colitis    COPD, moderate (HCC) 08/23/2015   Diverticulitis    Diverticulitis 03/29/2021   Febrile seizure (HCC)    as infant   Headache    History of CVA (cerebrovascular accident)    Hyperlipidemia    Mild intermittent asthma    New persistent daily headache    Osteopenia    Restless leg    Wears dentures    partial lower    Past Surgical History:  Procedure Laterality Date   ABDOMINAL HYSTERECTOMY     CATARACT EXTRACTION W/PHACO Left 10/09/2021   Procedure: CATARACT EXTRACTION PHACO AND INTRAOCULAR LENS PLACEMENT (IOC) LEFT 8.38 01:09.7;  Surgeon: Lockie Mola, MD;  Location: [facility];  Service: Ophthalmology;  Laterality: Left;   CATARACT EXTRACTION W/PHACO Right 10/23/2021   Procedure: CATARACT EXTRACTION PHACO AND INTRAOCULAR LENS PLACEMENT (IOC) RIGHT 5.51 00:48.8;  Surgeon: Lockie Mola, MD;  Location: [facility];  Service: Ophthalmology;  Laterality: Right;   COLONOSCOPY     TONSILLECTOMY     TUBAL LIGATION      Prior to Admission medications   Medication Sig Start Date End Date Taking? Authorizing Provider  aspirin EC 81 MG tablet Take 81 mg by mouth daily. Swallow whole.   Yes [provider]  baclofen (LIORESAL) 10 MG tablet Take 5 mg by mouth as needed for muscle spasms.   Yes [provider]  budesonide (ENTOCORT EC) 3 MG 24 hr capsule Take 1 capsule (3 mg total) by mouth  every morning. 06/20/22  Yes Kolbe Delmonaco, Loel Dubonnet, MD  calcium carbonate 1250 MG capsule Take 1,250 mg by mouth 2 (two) times daily with a meal.   Yes [provider]  lovastatin (MEVACOR) 20 MG tablet TAKE (1) TABLET BY MOUTH EVERY DAY 09/22/22  Yes Duanne Limerick, MD  Magnesium 400 MG TABS  01/01/21  Yes [provider]  Melatonin 10 MG TABS Take 1 tablet by mouth at bedtime.   Yes [provider]  montelukast (SINGULAIR) 10 MG tablet TAKE (1) TABLET BY MOUTH EVERY DAY 09/22/22  Yes Duanne Limerick, MD  Multiple Vitamins-Minerals (CENTRUM SILVER ADULT 50+ PO) Take 1 capsule by mouth daily at 6 (six) AM.   Yes [provider]  BREZTRI AEROSPHERE 160-9-4.8 MCG/ACT AERO Inhale 2 puffs into the lungs 2 (two) times daily. Patient not taking: Reported on 09/22/2022 07/23/22   [provider]  predniSONE (DELTASONE) 10 MG tablet Take 1 tablet (10 mg total) by mouth daily. Taper 4,4,4,3,3,3,2,2,2,1,1,1 Patient not taking: Reported on 11/17/2022 09/15/22   Duanne Limerick, MD    Allergies as of 11/11/2022   (No Known Allergies)    Family History  Problem Relation Age of Onset   Breast cancer Mother 74   Cancer Mother    Miscarriages / [place] Mother    Heart attack Father    Heart disease Father  Social History   Socioeconomic History   Marital status: Married    Spouse name: Not on file   Number of children: 3   Years of education: Not on file   Highest education level: Bachelor's degree (e.g., BA, AB, BS)  Occupational History    Comment: part time music teacher at [employer]  Tobacco Use   Smoking status: Every Day    Current packs/day: 0.50    Average packs/day: 0.5 packs/day for 64.8 years (32.4 ttl pk-yrs)    Types: Cigarettes    Start date: 02/17/1958   Smokeless tobacco: Never   Tobacco comments:    aware needs to stop   Vaping Use   Vaping status: Never Used  Substance and Sexual Activity   Alcohol use: Yes     Alcohol/week: 1.0 standard drink of alcohol    Types: 1 Glasses of wine per week   Drug use: No   Sexual activity: Not Currently    Birth control/protection: Abstinence, Surgical    Comment: Hysterectomy  Other Topics Concern   Not on file  Social History Narrative   Not on file   Social Determinants of Health   Financial Resource Strain: Low Risk  (06/16/2022)   Overall Financial Resource Strain (CARDIA)    Difficulty of Paying Living Expenses: Not hard at all  Food Insecurity: No Food Insecurity (06/16/2022)   Hunger Vital Sign    Worried About Running Out of Food in the Last Year: Never true    Ran Out of Food in the Last Year: Never true  Transportation Needs: No Transportation Needs (06/16/2022)   PRAPARE - Administrator, Civil Service (Medical): No    Lack of Transportation (Non-Medical): No  Physical Activity: Insufficiently Active (06/16/2022)   Exercise Vital Sign    Days of Exercise per Week: 2 days    Minutes of Exercise per Session: 30 min  Stress: No Stress Concern Present (06/16/2022)   [employer] of Occupational Health - Occupational Stress Questionnaire    Feeling of Stress : Not at all  Social Connections: Socially Integrated (06/16/2022)   Social Connection and Isolation Panel [NHANES]    Frequency of Communication with Friends and Family: More than three times a week    Frequency of Social Gatherings with Friends and Family: More than three times a week    Attends Religious Services: More than 4 times per year    Active Member of [employer] or Organizations: Yes    Attends Engineer, structural: More than 4 times per year    Marital Status: Married  Catering manager Violence: Not At Risk (05/28/2022)   Humiliation, Afraid, Rape, and Kick questionnaire    Fear of Current or Ex-Partner: No    Emotionally Abused: No    Physically Abused: No    Sexually Abused: No    Review of Systems: See HPI, otherwise negative ROS  Physical Exam: BP  129/72   Pulse 83   Temp (!) 97.2 F (36.2 C) (Temporal)   Resp (!) 21   Ht 5' 7.01" (1.702 m)   Wt 71.3 kg   SpO2 97%   BMI 24.62 kg/m  General:   Alert,  pleasant and cooperative in NAD Head:  Normocephalic and atraumatic. Neck:  Supple; no masses or thyromegaly. Lungs:  Clear throughout to auscultation.    Heart:  Regular rate and rhythm. Abdomen:  Soft, nontender and nondistended. Normal bowel sounds, without guarding, and without rebound.   Neurologic:  Alert and  oriented x4;  grossly normal neurologically.  Impression/Plan: ORISSA ARREAGA is here for an colonoscopy to be performed for h/o colon polyp  Risks, benefits, limitations, and alternatives regarding  colonoscopy have been reviewed with the patient.  Questions have been answered.  All parties agreeable.   Lannette Donath, MD  11/25/2022, 8:16 AM

## 2022-11-25 NOTE — Anesthesia Postprocedure Evaluation (Signed)
Anesthesia Post Note  Patient: Caroline Harris  Procedure(s) Performed: COLONOSCOPY WITH PROPOFOL (Rectum) POLYPECTOMY (Rectum)  Patient location during evaluation: PACU Anesthesia Type: General Level of consciousness: awake and alert Pain management: pain level controlled Vital Signs Assessment: post-procedure vital signs reviewed and stable Respiratory status: spontaneous breathing, nonlabored ventilation, respiratory function stable and patient connected to nasal cannula oxygen Cardiovascular status: blood pressure returned to baseline and stable Postop Assessment: no apparent nausea or vomiting Anesthetic complications: no   No notable events documented.   Last Vitals:  Vitals:   11/25/22 1020 11/25/22 1030  BP:  126/69  Pulse: (!) 58 (!) 57  Resp: (!) 30 19  Temp:    SpO2: 97% 98%    Last Pain:  Vitals:   11/25/22 1030  TempSrc:   PainSc: 0-No pain                 Tanaisha Pittman C Drakkar Medeiros

## 2022-11-25 NOTE — Op Note (Signed)
[facility] Patient Name: Caroline Harris Procedure Date: 11/25/2022 8:47 AM MRN: [id number] Account #: [id number] Date of Birth: 06-Apr-1945 Admit Type: Outpatient Age: 77 Room: [facility] OR ROOM 01 Gender: Female Note Status: Finalized Instrument Name: [id number] Procedure:             Colonoscopy Indications:           Surveillance: Personal history of adenomatous polyps                         on last colonoscopy > 3 years ago, Last colonoscopy:                         April 2013 Providers:             Toney Reil MD, MD Referring MD:          Duanne Limerick, MD (Referring MD) Medicines:             General Anesthesia Complications:         No immediate complications. Estimated blood loss: None. Procedure:             Pre-Anesthesia Assessment:                        - Prior to the procedure, a History and Physical was                         performed, and patient medications and allergies were                         reviewed. The patient is competent. The risks and                         benefits of the procedure and the sedation options and                         risks were discussed with the patient. All questions                         were answered and informed consent was obtained.                         Patient identification and proposed procedure were                         verified by the physician, the nurse, the                         anesthesiologist, the anesthetist and the technician                         in the pre-procedure area in the procedure room in the                         endoscopy suite. Mental Status Examination: alert and                         oriented. Airway Examination: normal oropharyngeal  airway and neck mobility. Respiratory Examination:                         clear to auscultation. CV Examination: normal.                         Prophylactic Antibiotics: The patient does not  require                         prophylactic antibiotics. Prior Anticoagulants: The                         patient has taken no anticoagulant or antiplatelet                         agents. ASA Grade Assessment: III - A patient with                         severe systemic disease. After reviewing the risks and                         benefits, the patient was deemed in satisfactory                         condition to undergo the procedure. The anesthesia                         plan was to use general anesthesia. Immediately prior                         to administration of medications, the patient was                         re-assessed for adequacy to receive sedatives. The                         heart rate, respiratory rate, oxygen saturations,                         blood pressure, adequacy of pulmonary ventilation, and                         response to care were monitored throughout the                         procedure. The physical status of the patient was                         re-assessed after the procedure.                        After obtaining informed consent, the colonoscope was                         passed under direct vision. Throughout the procedure,                         the patient's blood pressure, pulse, and oxygen  saturations were monitored continuously. The                         Colonoscope was introduced through the anus and                         advanced to the the terminal ileum, with                         identification of the appendiceal orifice and IC                         valve. The colonoscopy was unusually difficult due to                         multiple diverticula in the colon. Successful                         completion of the procedure was aided by withdrawing                         the scope and replacing with the pediatric colonoscope                         and applying abdominal pressure. The patient  tolerated                         the procedure well. The quality of the bowel                         preparation was evaluated using the BBPS [facility] Bowel                         Preparation Scale) with scores of: Right Colon = 3,                         Transverse Colon = 3 and Left Colon = 3 (entire mucosa                         seen well with no residual staining, small fragments                         of stool or opaque liquid). The total BBPS score                         equals 9. The terminal ileum, ileocecal valve,                         appendiceal orifice, and rectum were photographed. Findings:      The perianal and digital rectal examinations were normal. Pertinent       negatives include normal sphincter tone and no palpable rectal lesions.      The terminal ileum appeared normal.      A 40 mm polyp was found in the cecum. The polyp was carpet-like and       flat. Preparations were made for mucosal resection. Demarcation of the       lesion was performed with  narrow band imaging to clearly identify the       boundaries of the lesion. Eleview was injected to raise the lesion.       Snare mucosal resection was performed. Resection and retrieval were       complete. Resected tissue including tissue margins will be examined by       histology. To prevent bleeding after mucosal resection, four hemostatic       clips were successfully placed (MR safe). Clip manufacturer: [employer]. There was no bleeding during, or at the end, of the       procedure. Estimated blood loss: none.      A 40 mm polyp was found in the cecum. The polyp was carpet-like and       flat. Preparations were made for mucosal resection. Demarcation of the       lesion was performed with narrow band imaging to clearly identify the       boundaries of the lesion. Eleview was injected to raise the lesion.       Snare mucosal resection was performed. Resection and retrieval were       complete.  Resected tissue including tissue margins will be examined by       histology. To prevent bleeding after mucosal resection, five hemostatic       clips were successfully placed (MR safe). Clip manufacturer: [employer]. There was no bleeding during, or at the end, of the       procedure. Estimated blood loss: none.      A 5 mm polyp was found in the rectum. The polyp was sessile. The polyp       was removed with a hot snare. Resection and retrieval were complete.       Estimated blood loss: none.      The retroflexed view of the distal rectum and anal verge was normal and       showed no anal or rectal abnormalities.      Multiple diverticula were found in the recto-sigmoid colon and sigmoid       colon. Impression:            - The examined portion of the ileum was normal.                        - One 40 mm polyp in the cecum, removed with mucosal                         resection. Resected and retrieved. Clip manufacturer:                         [employer]. Clips (MR safe) were placed.                        - One 40 mm polyp in the cecum, removed with mucosal                         resection. Resected and retrieved. Clip manufacturer:                         [employer]. Clips (MR safe) were placed.                        -  One 5 mm polyp in the rectum, removed with a hot                         snare. Resected and retrieved.                        - The distal rectum and anal verge are normal on                         retroflexion view.                        - Diverticulosis in the recto-sigmoid colon and in the                         sigmoid colon.                        - Mucosal resection was performed. Resection and                         retrieval were complete.                        - Mucosal resection was performed. Resection and                         retrieval were complete. Recommendation:        - Discharge patient to home (with spouse).                         - Clear liquid diet today.                        - Continue present medications.                        - Await pathology results.                        - Repeat colonoscopy in 1 year for surveillance after                         piecemeal polypectomy. Procedure Code(s):     --- Professional ---                        [id number], Colonoscopy, flexible; with endoscopic mucosal                         resection                        [id number], 59, Colonoscopy, flexible; with removal of                         tumor(s), polyp(s), or other lesion(s) by snare                         technique Diagnosis Code(s):     --- Professional ---  Z86.010, Personal history of colonic polyps                        D12.0, Benign neoplasm of cecum                        D12.8, Benign neoplasm of rectum                        K57.30, Diverticulosis of large intestine without                         perforation or abscess without bleeding CPT copyright 2022 American [employer] Association. All rights reserved. The codes documented in this report are preliminary and upon coder review may  be revised to meet current compliance requirements. Dr. Libby Maw Toney Reil MD, MD 11/25/2022 10:14:53 AM This report has been signed electronically. Number of Addenda: 0 Note Initiated On: 11/25/2022 8:47 AM Scope Withdrawal Time: 0 hours 50 minutes 57 seconds  Total Procedure Duration: 1 hour 6 minutes 6 seconds  Estimated Blood Loss:  Estimated blood loss: none.      [facility]

## 2022-11-26 ENCOUNTER — Encounter: Payer: Self-pay | Admitting: Gastroenterology

## 2022-11-26 ENCOUNTER — Other Ambulatory Visit: Payer: Self-pay

## 2022-11-26 LAB — SURGICAL PATHOLOGY

## 2022-11-27 ENCOUNTER — Encounter: Payer: Self-pay | Admitting: Gastroenterology

## 2022-12-02 ENCOUNTER — Encounter: Payer: Self-pay | Admitting: Gastroenterology

## 2022-12-02 ENCOUNTER — Ambulatory Visit (INDEPENDENT_AMBULATORY_CARE_PROVIDER_SITE_OTHER): Payer: Medicare HMO | Admitting: Gastroenterology

## 2022-12-02 VITALS — BP 141/84 | HR 56 | Temp 97.8°F | Ht 67.0 in | Wt 161.5 lb

## 2022-12-02 DIAGNOSIS — F17210 Nicotine dependence, cigarettes, uncomplicated: Secondary | ICD-10-CM

## 2022-12-02 DIAGNOSIS — K52831 Collagenous colitis: Secondary | ICD-10-CM | POA: Diagnosis not present

## 2022-12-02 DIAGNOSIS — Z860101 Personal history of adenomatous and serrated colon polyps: Secondary | ICD-10-CM | POA: Diagnosis not present

## 2022-12-02 DIAGNOSIS — M7989 Other specified soft tissue disorders: Secondary | ICD-10-CM | POA: Diagnosis not present

## 2022-12-02 DIAGNOSIS — I10 Essential (primary) hypertension: Secondary | ICD-10-CM

## 2022-12-02 NOTE — Progress Notes (Signed)
Caroline Repress, MD [address]  [address]  [place], [place] [address]  Main: [phone]  Fax: [phone]    Gastroenterology Consultation  Referring Provider:     Duanne Limerick, MD Primary Care Physician:  Duanne Limerick, MD Primary Gastroenterologist:  Dr. Mechele Collin Reason for Consultation:  collagenous colitis        HPI:   Caroline Harris is a 77 y.o. female referred by Dr. Duanne Limerick, MD  for consultation & management of collagenous colitis.  Patient is diagnosed with collagenous colitis based on colonoscopy in 04/2018 after she was having chronic nonbloody diarrhea since 09/2017.  She started on Entocort 9 mg daily which has resulted in resolution of symptoms.  She is currently on 6 mg daily, in clinical remission.  She reports having 1 bowel movement daily.  She does not have any other GI symptoms.  She wanted to discuss about long-term management of collagenous colitis.  Patient is also found to have normocytic anemia.  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  She is currently maintained on budesonide 3 mg daily.  She tried to stop the medication last year which resulted in return of diarrhea.  He does not have any other concerns today.  She wants to know when she is due for next surveillance colonoscopy  Follow-up visit 04/03/2021 Patient was admitted to [facility] on 03/29/2021 secondary to left-sided abdominal pain, CT revealed acute uncomplicated proximal descending colon diverticulitis, she was admitted, received Zosyn and was discharged on Cipro and Flagyl for 10 days.  Patient reports that her abdominal pain has subsided.  However, for the last 2 to 3 days, she has been having nonbloody diarrhea with abdominal bloating.  She continues to take the antibiotics.  She is also taking budesonide 3 mg 1 pill daily.  She is planning to  make a trip to the mountains tomorrow to meet her grandchildren.  She did not try Imodium  Follow-up visit 10/24/2021 Patient is here for follow-up of collagenous colitis.  Patient is doing well on budesonide 3 mg 1 pill daily.  Reports having normal bowel movements.  She does not have any concerns today.  Follow-up visit 12/02/2022 Caroline Harris is here for follow-up of collagenous colitis.  She is doing currently well.  She has not been taking budesonide.  She recently underwent colonoscopy, she had 2 large flat polyps in cecum, underwent EMR, pathology revealed sessile serrated polyps only.  Patient reports that she has been noticing swelling in her ankles as well as bilateral legs since colonoscopy.  She does admit to eating salt containing snacks daily.  Her blood pressure he is also elevated today.  She has BP monitor at home and she does not know how to use it.  She does not have any GI concerns today  NSAIDs: None  Antiplts/Anticoagulants/Anti thrombotics: None  GI Procedures:  Colonoscopy 05/14/18 + Collagenous Colitis, Serrated Polyp, Repeat 5 years noted    Colonoscopy 11/25/2022 - The examined portion of the ileum was normal. - One 40 mm polyp in the cecum, removed with mucosal resection. Resected and retrieved. Clip manufacturer: [employer]. Clips ( MR safe) were placed. - One 40 mm polyp in the cecum, removed with mucosal resection. Resected and retrieved. Clip manufacturer: [employer]. Clips ( MR safe) were placed. - One 5 mm polyp in the  rectum, removed with a hot snare. Resected and retrieved. - The distal rectum and anal verge are normal on retroflexion view. - Diverticulosis in the recto- sigmoid colon and in the sigmoid colon.  1. Cecum Polyp, x2 - hot snare :       - SESSILE SERRATED POLYP (2).       - NEGATIVE FOR DYSPLASIA AND MALIGNANCY.        2. Rectum, polyp(s), -- hot snare :       - TUBULAR ADENOMA.       - NEGATIVE FOR HIGH-GRADE DYSPLASIA AND MALIGNANCY.     Past Medical History:  Diagnosis Date   Aortic atherosclerosis (HCC)    Arthritis    In fingers   CAD (coronary artery disease)    Cataract March, 2023   Will have removed   Colitis    Collagenous colitis    COPD, moderate (HCC) 08/23/2015   Diverticulitis    Diverticulitis 03/29/2021   Febrile seizure (HCC)    as infant   Headache    History of CVA (cerebrovascular accident)    Hyperlipidemia    Mild intermittent asthma    New persistent daily headache    Osteopenia    Restless leg    Wears dentures    partial lower    Past Surgical History:  Procedure Laterality Date   ABDOMINAL HYSTERECTOMY     CATARACT EXTRACTION W/PHACO Left 10/09/2021   Procedure: CATARACT EXTRACTION PHACO AND INTRAOCULAR LENS PLACEMENT (IOC) LEFT 8.38 01:09.7;  Surgeon: Lockie Mola, MD;  Location: [facility];  Service: Ophthalmology;  Laterality: Left;   CATARACT EXTRACTION W/PHACO Right 10/23/2021   Procedure: CATARACT EXTRACTION PHACO AND INTRAOCULAR LENS PLACEMENT (IOC) RIGHT 5.51 00:48.8;  Surgeon: Lockie Mola, MD;  Location: [facility];  Service: Ophthalmology;  Laterality: Right;   COLONOSCOPY     COLONOSCOPY WITH PROPOFOL N/A 11/25/2022   Procedure: COLONOSCOPY WITH PROPOFOL;  Surgeon: Toney Reil, MD;  Location: [facility];  Service: Endoscopy;  Laterality: N/A;   POLYPECTOMY  11/25/2022   Procedure: POLYPECTOMY;  Surgeon: Toney Reil, MD;  Location: [facility];  Service: Endoscopy;;   TONSILLECTOMY     TUBAL LIGATION      Current Outpatient Medications:    aspirin EC 81 MG tablet, Take 81 mg by mouth daily. Swallow whole., Disp: , Rfl:    Baclofen 5 MG TABS, Take 5 mg by mouth as needed (for headache rescue)., Disp: , Rfl:    lovastatin (MEVACOR) 20 MG tablet, TAKE (1) TABLET BY MOUTH EVERY DAY, Disp: 90 tablet, Rfl: 1   Melatonin 10 MG TABS, Take 1 tablet by mouth at bedtime., Disp: , Rfl:    montelukast (SINGULAIR)  10 MG tablet, TAKE (1) TABLET BY MOUTH EVERY DAY, Disp: 90 tablet, Rfl: 1   Multiple Vitamins-Minerals (CENTRUM SILVER ADULT 50+ PO), Take 1 capsule by mouth daily at 6 (six) AM., Disp: , Rfl:    Riboflavin (VITAMIN B-2 PO), Take by mouth., Disp: , Rfl:    budesonide (ENTOCORT EC) 3 MG 24 hr capsule, Take 1 capsule (3 mg total) by mouth every morning. (Patient not taking: Reported on 12/02/2022), Disp: 90 capsule, Rfl: 1   calcium carbonate 1250 MG capsule, Take 1,250 mg by mouth 2 (two) times daily with a meal. (Patient not taking: Reported on 12/02/2022), Disp: , Rfl:    Magnesium 400 MG TABS, , Disp: , Rfl:    Family History  Problem Relation Age of Onset  Breast cancer Mother 72   Cancer Mother    Miscarriages / [place] Mother    Heart attack Father    Heart disease Father      Social History   Tobacco Use   Smoking status: Every Day    Current packs/day: 0.50    Average packs/day: 0.5 packs/day for 64.8 years (32.4 ttl pk-yrs)    Types: Cigarettes    Start date: 02/17/1958   Smokeless tobacco: Never   Tobacco comments:    aware needs to stop   Vaping Use   Vaping status: Never Used  Substance Use Topics   Alcohol use: Yes    Alcohol/week: 1.0 standard drink of alcohol    Types: 1 Glasses of wine per week   Drug use: No    Allergies as of 12/02/2022   (No Known Allergies)    Review of Systems:    All systems reviewed and negative except where noted in HPI.   Physical Exam:  BP (!) 141/84 (BP Location: Left Arm, Patient Position: Sitting, Cuff Size: Normal)   Pulse (!) 56   Temp 97.8 F (36.6 C) (Oral)   Ht 5\' 7"  (1.702 m)   Wt 161 lb 8 oz (73.3 kg)   BMI 25.29 kg/m  No LMP recorded. Patient has had a hysterectomy.  General:   Alert,  Well-developed, well-nourished, pleasant and cooperative in NAD Head:  Normocephalic and atraumatic. Eyes:  Sclera clear, no icterus.   Conjunctiva pink. Ears:  Normal auditory acuity. Nose:  No deformity, discharge, or  lesions. Mouth:  No deformity or lesions,oropharynx pink & moist. Neck:  Supple; no masses or thyromegaly. Lungs:  Respirations even and unlabored.  Clear throughout to auscultation.   No wheezes, crackles, or rhonchi. No acute distress. Heart:  Regular rate and rhythm; no murmurs, clicks, rubs, or gallops. Abdomen:  Normal bowel sounds. Soft, non-tender and non-distended without masses, hepatosplenomegaly or hernias noted.  No guarding or rebound tenderness.   Rectal: Not performed Msk:  Symmetrical without gross deformities. Good, equal movement & strength bilaterally. Pulses:  Normal pulses noted. Extremities:  No clubbing or edema.  No cyanosis. Neurologic:  Alert and oriented x3;  grossly normal neurologically. Skin:  Intact without significant lesions or rashes. No jaundice. Psych:  Alert and cooperative. Normal mood and affect.  Imaging Studies: Reviewed  Assessment and Plan:   Caroline Harris is a 77 y.o. female with history of chronic tobacco use, restless leg syndrome, history of collagenous colitis previously treated with budesonide, currently in clinical remission  Serrated polyps of the colon Recommend surveillance colonoscopy in 11/2024  Bilateral swelling of legs Secondary to hypertension and consumption of high sodium containing snacks Check electrolytes, mag and Phos today Advised about low-sodium diet, information provided Monitor BP at home and follow-up with PCP   Follow up as needed   Caroline Repress, MD

## 2022-12-02 NOTE — Patient Instructions (Signed)
Low-Sodium Eating Plan Salt (sodium) helps you keep a healthy balance of fluids in your body. Too much sodium can raise your blood pressure. It can also cause fluid and waste to be held in your body. Your health care provider or dietitian may recommend a low-sodium eating plan if you have high blood pressure (hypertension), kidney disease, liver disease, or heart failure. Eating less sodium can help lower your blood pressure and reduce swelling. It can also protect your heart, liver, and kidneys. What are tips for following this plan? Reading food labels  Check food labels for the amount of sodium per serving. If you eat more than one serving, you must multiply the listed amount by the number of servings. Choose foods with less than 140 milligrams (mg) of sodium per serving. Avoid foods with 300 mg of sodium or more per serving. Always check how much sodium is in a product, even if the label says "unsalted" or "no salt added." Shopping  Buy products labeled as "low-sodium" or "no salt added." Buy fresh foods. Avoid canned foods and pre-made or frozen meals. Avoid canned, cured, or processed meats. Buy breads that have less than 80 mg of sodium per slice. Cooking  Eat more home-cooked food. Try to eat less restaurant, buffet, and fast food. Try not to add salt when you cook. Use salt-free seasonings or herbs instead of table salt or sea salt. Check with your provider or pharmacist before using salt substitutes. Cook with plant-based oils, such as canola, sunflower, or olive oil. Meal planning When eating at a restaurant, ask if your food can be made with less salt or no salt. Avoid dishes labeled as brined, pickled, cured, or smoked. Avoid dishes made with soy sauce, miso, or teriyaki sauce. Avoid foods that have monosodium glutamate (MSG) in them. MSG may be added to some restaurant food, sauces, soups, bouillon, and canned foods. Make meals that can be grilled, baked, poached, roasted, or  steamed. These are often made with less sodium. General information Try to limit your sodium intake to 1,500-2,300 mg each day, or the amount told by your provider. What foods should I eat? Fruits Fresh, frozen, or canned fruit. Fruit juice. Vegetables Fresh or frozen vegetables. "No salt added" canned vegetables. "No salt added" tomato sauce and paste. Low-sodium or reduced-sodium tomato and vegetable juice. Grains Low-sodium cereals, such as oats, puffed wheat and rice, and shredded wheat. Low-sodium crackers. Unsalted rice. Unsalted pasta. Low-sodium bread. Whole grain breads and whole grain pasta. Meats and other proteins Fresh or frozen meat, poultry, seafood, and fish. These should have no added salt. Low-sodium canned tuna and salmon. Unsalted nuts. Dried peas, beans, and lentils without added salt. Unsalted canned beans. Eggs. Unsalted nut butters. Dairy Milk. Soy milk. Cheese that is naturally low in sodium, such as ricotta cheese, fresh mozzarella, or Swiss cheese. Low-sodium or reduced-sodium cheese. Cream cheese. Yogurt. Seasonings and condiments Fresh and dried herbs and spices. Salt-free seasonings. Low-sodium mustard and ketchup. Sodium-free salad dressing. Sodium-free light mayonnaise. Fresh or refrigerated horseradish. Lemon juice. Vinegar. Other foods Homemade, reduced-sodium, or low-sodium soups. Unsalted popcorn and pretzels. Low-salt or salt-free chips. The items listed above may not be all the foods and drinks you can have. Talk to a dietitian to learn more. What foods should I avoid? Vegetables Sauerkraut, pickled vegetables, and relishes. Olives. [place] fries. Onion rings. Regular canned vegetables, except low-sodium or reduced-sodium items. Regular canned tomato sauce and paste. Regular tomato and vegetable juice. Frozen vegetables in sauces. Grains Instant  hot cereals. Bread stuffing, pancake, and biscuit mixes. Croutons. Seasoned rice or pasta mixes. Noodle soup  cups. Boxed or frozen macaroni and cheese. Regular salted crackers. Self-rising flour. Meats and other proteins Meat or fish that is salted, canned, smoked, spiced, or pickled. Precooked or cured meat, such as sausages or meat loaves. Tomasa Blase. Ham. Pepperoni. Hot dogs. Corned beef. Chipped beef. Salt pork. Jerky. Pickled herring, anchovies, and sardines. Regular canned tuna. Salted nuts. Dairy Processed cheese and cheese spreads. Hard cheeses. Cheese curds. Blue cheese. Feta cheese. String cheese. Regular cottage cheese. Buttermilk. Canned milk. Fats and oils Salted butter. Regular margarine. Ghee. Bacon fat. Seasonings and condiments Onion salt, garlic salt, seasoned salt, table salt, and sea salt. Canned and packaged gravies. Worcestershire sauce. Tartar sauce. Barbecue sauce. Teriyaki sauce. Soy sauce, including reduced-sodium soy sauce. Steak sauce. Fish sauce. Oyster sauce. Cocktail sauce. Horseradish that you find on the shelf. Regular ketchup and mustard. Meat flavorings and tenderizers. Bouillon cubes. Hot sauce. Pre-made or packaged marinades. Pre-made or packaged taco seasonings. Relishes. Regular salad dressings. Salsa. Other foods Salted popcorn and pretzels. Corn chips and puffs. Potato and tortilla chips. Canned or dried soups. Pizza. Frozen entrees and pot pies. The items listed above may not be all the foods and drinks you should avoid. Talk to a dietitian to learn more. This information is not intended to replace advice given to you by your health care provider. Make sure you discuss any questions you have with your health care provider. Document Revised: 02/20/2022 Document Reviewed: 02/20/2022 Elsevier Patient Education  2024 [employer].

## 2022-12-03 ENCOUNTER — Encounter: Payer: Self-pay | Admitting: Gastroenterology

## 2022-12-03 LAB — BASIC METABOLIC PANEL
BUN/Creatinine Ratio: 16 (ref 12–28)
BUN: 10 mg/dL (ref 8–27)
CO2: 24 mmol/L (ref 20–29)
Calcium: 9 mg/dL (ref 8.7–10.3)
Chloride: 104 mmol/L (ref 96–106)
Creatinine, Ser: 0.64 mg/dL (ref 0.57–1.00)
Glucose: 86 mg/dL (ref 70–99)
Potassium: 4.2 mmol/L (ref 3.5–5.2)
Sodium: 143 mmol/L (ref 134–144)
eGFR: 90 mL/min/{1.73_m2} (ref >59)

## 2022-12-03 LAB — MAGNESIUM: Magnesium: 2.1 mg/dL (ref 1.6–2.3)

## 2022-12-03 LAB — PHOSPHORUS: Phosphorus: 3.9 mg/dL (ref 3.0–4.3)

## 2022-12-08 ENCOUNTER — Encounter: Payer: Self-pay | Admitting: Gastroenterology

## 2022-12-09 ENCOUNTER — Other Ambulatory Visit: Payer: Self-pay

## 2022-12-09 ENCOUNTER — Encounter: Payer: Self-pay | Admitting: Family Medicine

## 2022-12-09 DIAGNOSIS — K52831 Collagenous colitis: Secondary | ICD-10-CM

## 2022-12-12 ENCOUNTER — Encounter: Payer: Self-pay | Admitting: Family Medicine

## 2022-12-12 ENCOUNTER — Ambulatory Visit (INDEPENDENT_AMBULATORY_CARE_PROVIDER_SITE_OTHER): Payer: Medicare HMO | Admitting: Family Medicine

## 2022-12-12 VITALS — BP 130/82 | HR 60 | Ht 67.0 in | Wt 161.2 lb

## 2022-12-12 DIAGNOSIS — R030 Elevated blood-pressure reading, without diagnosis of hypertension: Secondary | ICD-10-CM

## 2022-12-12 DIAGNOSIS — L0390 Cellulitis, unspecified: Secondary | ICD-10-CM

## 2022-12-12 MED ORDER — AMOXICILLIN-POT CLAVULANATE 875-125 MG PO TABS
1.0000 | ORAL_TABLET | Freq: Two times a day (BID) | ORAL | 0 refills | Status: DC
Start: 2022-12-12 — End: 2023-01-02

## 2022-12-12 MED ORDER — MUPIROCIN 2 % EX OINT
1.0000 | TOPICAL_OINTMENT | Freq: Two times a day (BID) | CUTANEOUS | 0 refills | Status: DC
Start: 2022-12-12 — End: 2023-07-06

## 2022-12-12 NOTE — Progress Notes (Addendum)
Date:  12/12/2022   Name:  Caroline Harris   DOB:  05/01/1945   MRN:  [id number]   Chief Complaint: Hypertension  Hypertension This is a new problem. The problem has been waxing and waning since onset. The problem is controlled. Pertinent negatives include no blurred vision, chest pain, headaches, neck pain, palpitations or shortness of breath. Past treatments include lifestyle changes. There are no compliance problems.  There is no history of chronic renal disease, a hypertension causing med or renovascular disease.    Lab Results  Component Value Date   NA 143 12/02/2022   K 4.2 12/02/2022   CO2 24 12/02/2022   GLUCOSE 86 12/02/2022   BUN 10 12/02/2022   CREATININE 0.64 12/02/2022   CALCIUM 9.0 12/02/2022   EGFR 90 12/02/2022   GFRNONAA >60 03/31/2021   Lab Results  Component Value Date   CHOL 206 (H) 09/22/2022   HDL 105 09/22/2022   LDLCALC 81 09/22/2022   TRIG 117 09/22/2022   CHOLHDL 1.7 11/05/2018   Lab Results  Component Value Date   TSH 1.120 06/01/2017   No results found for: "HGBA1C" Lab Results  Component Value Date   WBC 8.0 09/25/2021   HGB 12.9 09/25/2021   HCT 39.1 09/25/2021   MCV 95 09/25/2021   PLT 289 09/25/2021   Lab Results  Component Value Date   ALT 21 05/28/2022   AST 26 05/28/2022   ALKPHOS 41 (L) 05/28/2022   BILITOT 0.3 05/28/2022   No results found for: "25OHVITD2", "25OHVITD3", "VD25OH"   Review of Systems  Constitutional:  Negative for chills and fever.  HENT:  Negative for drooling, ear discharge, ear pain and sore throat.   Eyes:  Negative for blurred vision.  Respiratory:  Negative for cough, shortness of breath and wheezing.   Cardiovascular:  Negative for chest pain, palpitations and leg swelling.  Gastrointestinal:  Negative for abdominal pain, blood in stool, constipation, diarrhea and nausea.  Endocrine: Negative for polydipsia.  Genitourinary:  Negative for dysuria, frequency, hematuria and urgency.   Musculoskeletal:  Negative for back pain, myalgias and neck pain.  Skin:  Negative for rash.  Allergic/Immunologic: Negative for environmental allergies.  Neurological:  Negative for dizziness and headaches.  Hematological:  Does not bruise/bleed easily.  Psychiatric/Behavioral:  Negative for suicidal ideas. The patient is not nervous/anxious.     Patient Active Problem List   Diagnosis Date Noted   History of colonic polyps 11/25/2022   Adenomatous polyp of cecum 11/25/2022   Rectal polyp 11/25/2022   Other synovitis and tenosynovitis, unspecified hand 03/13/2021   Inflammatory pain 03/13/2021   Ganglion cyst of finger of right hand 06/25/2020   Pain in finger of right hand 06/25/2020   Aortic atherosclerosis (HCC) 11/05/2018   COPD, moderate (HCC) 08/23/2015   Asthma, mild intermittent 06/07/2015   Hyperlipidemia 06/07/2015    No Known Allergies  Past Surgical History:  Procedure Laterality Date   ABDOMINAL HYSTERECTOMY     CATARACT EXTRACTION W/PHACO Left 10/09/2021   Procedure: CATARACT EXTRACTION PHACO AND INTRAOCULAR LENS PLACEMENT (IOC) LEFT 8.38 01:09.7;  Surgeon: Lockie Mola, MD;  Location: [facility];  Service: Ophthalmology;  Laterality: Left;   CATARACT EXTRACTION W/PHACO Right 10/23/2021   Procedure: CATARACT EXTRACTION PHACO AND INTRAOCULAR LENS PLACEMENT (IOC) RIGHT 5.51 00:48.8;  Surgeon: Lockie Mola, MD;  Location: [facility];  Service: Ophthalmology;  Laterality: Right;   COLONOSCOPY     COLONOSCOPY WITH PROPOFOL N/A 11/25/2022   Procedure: COLONOSCOPY  WITH PROPOFOL;  Surgeon: Toney Reil, MD;  Location: [facility];  Service: Endoscopy;  Laterality: N/A;   POLYPECTOMY  11/25/2022   Procedure: POLYPECTOMY;  Surgeon: Toney Reil, MD;  Location: [facility];  Service: Endoscopy;;   TONSILLECTOMY     TUBAL LIGATION      Social History   Tobacco Use   Smoking status: Every Day    Current  packs/day: 0.50    Average packs/day: 0.5 packs/day for 64.8 years (32.4 ttl pk-yrs)    Types: Cigarettes    Start date: 02/17/1958   Smokeless tobacco: Never   Tobacco comments:    aware needs to stop   Vaping Use   Vaping status: Never Used  Substance Use Topics   Alcohol use: Yes    Alcohol/week: 1.0 standard drink of alcohol    Types: 1 Glasses of wine per week   Drug use: No     Medication list has been reviewed and updated.  Current Meds  Medication Sig   aspirin EC 81 MG tablet Take 81 mg by mouth daily. Swallow whole.   Baclofen 5 MG TABS Take 5 mg by mouth as needed (for headache rescue).   lovastatin (MEVACOR) 20 MG tablet TAKE (1) TABLET BY MOUTH EVERY DAY   Melatonin 10 MG TABS Take 1 tablet by mouth at bedtime.   montelukast (SINGULAIR) 10 MG tablet TAKE (1) TABLET BY MOUTH EVERY DAY   Multiple Vitamins-Minerals (CENTRUM SILVER ADULT 50+ PO) Take 1 capsule by mouth daily at 6 (six) AM.   Riboflavin (VITAMIN B-2 PO) Take by mouth.       12/12/2022    3:19 PM 09/22/2022    8:33 AM 09/15/2022   11:25 AM 05/05/2022    2:37 PM  GAD 7 : Generalized Anxiety Score  Nervous, Anxious, on Edge 0 0 0 0  Control/stop worrying 0 0 0 0  Worry too much - different things 0 0 0 0  Trouble relaxing 0 0 0 0  Restless 0 0 0 0  Easily annoyed or irritable 0 0 0 0  Afraid - awful might happen 0 0 0 0  Total GAD 7 Score 0 0 0 0  Anxiety Difficulty Not difficult at all Not difficult at all Not difficult at all Not difficult at all       12/12/2022    3:19 PM 09/22/2022    8:33 AM 09/15/2022   11:25 AM  Depression screen PHQ 2/9  Decreased Interest 0 0 0  Down, Depressed, Hopeless 0 0 0  PHQ - 2 Score 0 0 0  Altered sleeping 0 0 0  Tired, decreased energy 0 0 0  Change in appetite 0 0 0  Feeling bad or failure about yourself  0 0 0  Trouble concentrating 0 0 0  Moving slowly or fidgety/restless 0 0 0  Suicidal thoughts 0 0 0  PHQ-9 Score 0 0 0  Difficult doing work/chores  Not difficult at all Not difficult at all Not difficult at all    BP Readings from Last 3 Encounters:  12/12/22 130/82  12/02/22 (!) 141/84  11/25/22 126/69    Physical Exam Vitals and nursing note reviewed. Exam conducted with a chaperone present.  Constitutional:      General: She is not in acute distress.    Appearance: She is not diaphoretic.  HENT:     Head: Normocephalic and atraumatic.     Right Ear: Tympanic membrane and external ear normal.  Left Ear: Tympanic membrane and external ear normal.     Nose: Nose normal.  Eyes:     General:        Right eye: No discharge.        Left eye: No discharge.     Conjunctiva/sclera: Conjunctivae normal.     Pupils: Pupils are equal, round, and reactive to light.  Neck:     Thyroid: No thyromegaly.     Vascular: No JVD.  Cardiovascular:     Rate and Rhythm: Normal rate and regular rhythm.     Heart sounds: Normal heart sounds. No murmur heard.    No friction rub. No gallop.  Pulmonary:     Effort: Pulmonary effort is normal.     Breath sounds: No wheezing, rhonchi or rales.  Abdominal:     General: Bowel sounds are normal.     Palpations: Abdomen is soft. There is no mass.     Tenderness: There is no abdominal tenderness. There is no guarding.  Musculoskeletal:        General: Normal range of motion.     Cervical back: Normal range of motion and neck supple.  Lymphadenopathy:     Cervical: No cervical adenopathy.  Skin:    General: Skin is warm and dry.  Neurological:     Mental Status: She is alert.     Wt Readings from Last 3 Encounters:  12/12/22 161 lb 3.2 oz (73.1 kg)  12/02/22 161 lb 8 oz (73.3 kg)  11/25/22 157 lb 3.2 oz (71.3 kg)    BP 130/82   Pulse 60   Ht 5\' 7"  (1.702 m)   Wt 161 lb 3.2 oz (73.1 kg)   SpO2 95%   BMI 25.25 kg/m   Assessment and Plan:  .1. Cellulitis, unspecified cellulitis site Patient hit foot on furniture and has an abrasion with secondary erythematous likely cellulitis.   Will treat with Augmentin 875 mg twice a day.  As well as Bactroban.  Ointment applied twice a day. 2.  Elevated blood pressure without hypertension.  Blood pressure was rechecked with her machine and her machine is elevated but my reading as well as the nurses reading are in acceptable range.  Discussed situational elevation particularly if she is in a procedure oriented visit.  Patient will continue to monitor blood pressure on a once a day basis otherwise she has been encouraged to watch her sodium in the meantime.  Elizabeth Sauer, MD

## 2022-12-15 ENCOUNTER — Encounter: Payer: Self-pay | Admitting: Family Medicine

## 2023-01-02 ENCOUNTER — Ambulatory Visit (INDEPENDENT_AMBULATORY_CARE_PROVIDER_SITE_OTHER): Payer: Medicare HMO | Admitting: Family Medicine

## 2023-01-02 ENCOUNTER — Encounter: Payer: Self-pay | Admitting: Family Medicine

## 2023-01-02 VITALS — BP 128/78 | HR 74 | Ht 67.0 in | Wt 159.0 lb

## 2023-01-02 DIAGNOSIS — J0100 Acute maxillary sinusitis, unspecified: Secondary | ICD-10-CM | POA: Diagnosis not present

## 2023-01-02 DIAGNOSIS — R051 Acute cough: Secondary | ICD-10-CM

## 2023-01-02 LAB — POC COVID19 BINAXNOW: SARS Coronavirus 2 Ag: NEGATIVE

## 2023-01-02 MED ORDER — PROMETHAZINE-DM 6.25-15 MG/5ML PO SYRP
5.0000 mL | ORAL_SOLUTION | Freq: Four times a day (QID) | ORAL | 0 refills | Status: DC | PRN
Start: 2023-01-02 — End: 2023-03-20

## 2023-01-02 MED ORDER — AZITHROMYCIN 250 MG PO TABS
ORAL_TABLET | ORAL | 0 refills | Status: AC
Start: 2023-01-02 — End: 2023-01-07

## 2023-01-02 NOTE — Progress Notes (Signed)
Date:  01/02/2023   Name:  Caroline Harris   DOB:  01-Feb-1946   MRN:  [id number]   Chief Complaint: Cough (Started 2 days ago. Cough-green mucous, headache, and congestion. No fever. No SOB. )  Sinusitis This is a new problem. The current episode started in the past 7 days. The problem has been gradually improving since onset. The pain is mild. Pertinent negatives include no chills, congestion, coughing, diaphoresis, ear pain, headaches, hoarse voice, neck pain, shortness of breath, sinus pressure, sneezing, sore throat or swollen glands. Past treatments include nothing. The treatment provided moderate relief.    Lab Results  Component Value Date   NA 143 12/02/2022   K 4.2 12/02/2022   CO2 24 12/02/2022   GLUCOSE 86 12/02/2022   BUN 10 12/02/2022   CREATININE 0.64 12/02/2022   CALCIUM 9.0 12/02/2022   EGFR 90 12/02/2022   GFRNONAA >60 03/31/2021   Lab Results  Component Value Date   CHOL 206 (H) 09/22/2022   HDL 105 09/22/2022   LDLCALC 81 09/22/2022   TRIG 117 09/22/2022   CHOLHDL 1.7 11/05/2018   Lab Results  Component Value Date   TSH 1.120 06/01/2017   No results found for: "HGBA1C" Lab Results  Component Value Date   WBC 8.0 09/25/2021   HGB 12.9 09/25/2021   HCT 39.1 09/25/2021   MCV 95 09/25/2021   PLT 289 09/25/2021   Lab Results  Component Value Date   ALT 21 05/28/2022   AST 26 05/28/2022   ALKPHOS 41 (L) 05/28/2022   BILITOT 0.3 05/28/2022   No results found for: "25OHVITD2", "25OHVITD3", "VD25OH"   Review of Systems  Constitutional:  Negative for chills and diaphoresis.  HENT:  Negative for congestion, ear pain, hoarse voice, sinus pressure, sneezing and sore throat.   Respiratory:  Negative for cough and shortness of breath.   Musculoskeletal:  Negative for neck pain.  Neurological:  Negative for headaches.    Patient Active Problem List   Diagnosis Date Noted  . History of colonic polyps 11/25/2022  . Adenomatous polyp of cecum  11/25/2022  . Rectal polyp 11/25/2022  . Other synovitis and tenosynovitis, unspecified hand 03/13/2021  . Inflammatory pain 03/13/2021  . Ganglion cyst of finger of right hand 06/25/2020  . Pain in finger of right hand 06/25/2020  . Aortic atherosclerosis (HCC) 11/05/2018  . COPD, moderate (HCC) 08/23/2015  . Asthma, mild intermittent 06/07/2015  . Hyperlipidemia 06/07/2015    No Known Allergies  Past Surgical History:  Procedure Laterality Date  . ABDOMINAL HYSTERECTOMY    . CATARACT EXTRACTION W/PHACO Left 10/09/2021   Procedure: CATARACT EXTRACTION PHACO AND INTRAOCULAR LENS PLACEMENT (IOC) LEFT 8.38 01:09.7;  Surgeon: Lockie Mola, MD;  Location: [facility];  Service: Ophthalmology;  Laterality: Left;  . CATARACT EXTRACTION W/PHACO Right 10/23/2021   Procedure: CATARACT EXTRACTION PHACO AND INTRAOCULAR LENS PLACEMENT (IOC) RIGHT 5.51 00:48.8;  Surgeon: Lockie Mola, MD;  Location: [facility];  Service: Ophthalmology;  Laterality: Right;  . COLONOSCOPY    . COLONOSCOPY WITH PROPOFOL N/A 11/25/2022   Procedure: COLONOSCOPY WITH PROPOFOL;  Surgeon: Toney Reil, MD;  Location: [facility];  Service: Endoscopy;  Laterality: N/A;  . POLYPECTOMY  11/25/2022   Procedure: POLYPECTOMY;  Surgeon: Toney Reil, MD;  Location: [facility];  Service: Endoscopy;;  . TONSILLECTOMY    . TUBAL LIGATION      Social History   Tobacco Use  . Smoking status: Every Day  Current packs/day: 0.50    Average packs/day: 0.5 packs/day for 64.9 years (32.4 ttl pk-yrs)    Types: Cigarettes    Start date: 02/17/1958  . Smokeless tobacco: Never  . Tobacco comments:    aware needs to stop   Vaping Use  . Vaping status: Never Used  Substance Use Topics  . Alcohol use: Yes    Alcohol/week: 1.0 standard drink of alcohol    Types: 1 Glasses of wine per week  . Drug use: No     Medication list has been reviewed and updated.  Current Meds   Medication Sig  . aspirin EC 81 MG tablet Take 81 mg by mouth daily. Swallow whole.  . Baclofen 5 MG TABS Take 5 mg by mouth as needed (for headache rescue).  Marland Kitchen lovastatin (MEVACOR) 20 MG tablet TAKE (1) TABLET BY MOUTH EVERY DAY  . Melatonin 10 MG TABS Take 1 tablet by mouth at bedtime.  . montelukast (SINGULAIR) 10 MG tablet TAKE (1) TABLET BY MOUTH EVERY DAY  . Multiple Vitamins-Minerals (CENTRUM SILVER ADULT 50+ PO) Take 1 capsule by mouth daily at 6 (six) AM.  . mupirocin ointment (BACTROBAN) 2 % Apply 1 Application topically 2 (two) times daily.       01/02/2023   10:18 AM 12/12/2022    3:19 PM 09/22/2022    8:33 AM 09/15/2022   11:25 AM  GAD 7 : Generalized Anxiety Score  Nervous, Anxious, on Edge 0 0 0 0  Control/stop worrying 0 0 0 0  Worry too much - different things 0 0 0 0  Trouble relaxing 0 0 0 0  Restless 0 0 0 0  Easily annoyed or irritable 0 0 0 0  Afraid - awful might happen 0 0 0 0  Total GAD 7 Score 0 0 0 0  Anxiety Difficulty Not difficult at all Not difficult at all Not difficult at all Not difficult at all       01/02/2023   10:18 AM 12/12/2022    3:19 PM 09/22/2022    8:33 AM  Depression screen PHQ 2/9  Decreased Interest 0 0 0  Down, Depressed, Hopeless 0 0 0  PHQ - 2 Score 0 0 0  Altered sleeping 0 0 0  Tired, decreased energy 0 0 0  Change in appetite 0 0 0  Feeling bad or failure about yourself  0 0 0  Trouble concentrating 0 0 0  Moving slowly or fidgety/restless 0 0 0  Suicidal thoughts 0 0 0  PHQ-9 Score 0 0 0  Difficult doing work/chores Not difficult at all Not difficult at all Not difficult at all    BP Readings from Last 3 Encounters:  01/02/23 128/78  12/12/22 130/82  12/02/22 (!) 141/84    Physical Exam Vitals and nursing note reviewed. Exam conducted with a chaperone present.  Constitutional:      General: She is not in acute distress.    Appearance: She is not diaphoretic.  HENT:     Head: Normocephalic and atraumatic.      Right Ear: Tympanic membrane and external ear normal.     Left Ear: Tympanic membrane and external ear normal.     Nose: Nose normal.     Mouth/Throat:     Mouth: Mucous membranes are moist.  Eyes:     General:        Right eye: No discharge.        Left eye: No discharge.  Conjunctiva/sclera: Conjunctivae normal.     Pupils: Pupils are equal, round, and reactive to light.  Neck:     Thyroid: No thyromegaly.     Vascular: No JVD.  Cardiovascular:     Rate and Rhythm: Normal rate and regular rhythm.     Heart sounds: Normal heart sounds. No murmur heard.    No friction rub. No gallop.  Pulmonary:     Effort: Pulmonary effort is normal.     Breath sounds: Normal breath sounds. No wheezing, rhonchi or rales.  Abdominal:     General: Bowel sounds are normal.     Palpations: Abdomen is soft. There is no mass.     Tenderness: There is no abdominal tenderness. There is no guarding or rebound.  Musculoskeletal:        General: Normal range of motion.     Cervical back: Normal range of motion and neck supple.  Lymphadenopathy:     Cervical: No cervical adenopathy.  Skin:    General: Skin is warm and dry.  Neurological:     Mental Status: She is alert.     Deep Tendon Reflexes: Reflexes are normal and symmetric.    Wt Readings from Last 3 Encounters:  01/02/23 159 lb (72.1 kg)  12/12/22 161 lb 3.2 oz (73.1 kg)  12/02/22 161 lb 8 oz (73.3 kg)    BP 128/78   Pulse 74   Ht 5\' 7"  (1.702 m)   Wt 159 lb (72.1 kg)   SpO2 95%   BMI 24.90 kg/m   Assessment and Plan: 1. Acute cough New onset.  Episodic.  Productive of greenish sputum.  Without fever or chills.  Without dyspnea or hemoptysis.  Will check COVID/was noted to be negative.  Symptomatology will be achieved with promethazine with dextromethorphan 1 teaspoon every 6 hours. - POC COVID-19 BinaxNow - promethazine-dextromethorphan (PROMETHAZINE-DM) 6.25-15 MG/5ML syrup; Take 5 mLs by mouth 4 (four) times daily as needed.   Dispense: 118 mL; Refill: 0  2. Acute maxillary sinusitis, recurrence not specified New onset.  Persistent.  Stable.  Symptomatology and exam with tenderness over the maxillary sinuses bilateral.  Patient recently has come off 3 weeks ago of Augmentin and we will treat with azithromycin to 50 mg 2 today followed by 1 a day for 4 days. - azithromycin (ZITHROMAX) 250 MG tablet; Take 2 tablets on day 1, then 1 tablet daily on days 2 through 5  Dispense: 6 tablet; Refill: 0     Elizabeth Sauer, MD

## 2023-01-09 ENCOUNTER — Ambulatory Visit (INDEPENDENT_AMBULATORY_CARE_PROVIDER_SITE_OTHER): Payer: Medicare HMO | Admitting: Family Medicine

## 2023-01-09 ENCOUNTER — Encounter: Payer: Self-pay | Admitting: Family Medicine

## 2023-01-09 VITALS — BP 122/74 | HR 58 | Ht 67.0 in | Wt 158.0 lb

## 2023-01-09 DIAGNOSIS — J4521 Mild intermittent asthma with (acute) exacerbation: Secondary | ICD-10-CM | POA: Diagnosis not present

## 2023-01-09 DIAGNOSIS — J0100 Acute maxillary sinusitis, unspecified: Secondary | ICD-10-CM

## 2023-01-09 DIAGNOSIS — J301 Allergic rhinitis due to pollen: Secondary | ICD-10-CM

## 2023-01-09 MED ORDER — FLUTICASONE PROPIONATE 50 MCG/ACT NA SUSP
2.0000 | Freq: Every day | NASAL | 6 refills | Status: DC
Start: 2023-01-09 — End: 2023-03-20

## 2023-01-09 MED ORDER — PREDNISONE 10 MG PO TABS
10.0000 mg | ORAL_TABLET | Freq: Every day | ORAL | 0 refills | Status: DC
Start: 2023-01-09 — End: 2023-03-20

## 2023-01-09 MED ORDER — BENZONATATE 100 MG PO CAPS
100.0000 mg | ORAL_CAPSULE | Freq: Three times a day (TID) | ORAL | 0 refills | Status: DC | PRN
Start: 2023-01-09 — End: 2023-03-20

## 2023-01-09 MED ORDER — AMOXICILLIN-POT CLAVULANATE 875-125 MG PO TABS
1.0000 | ORAL_TABLET | Freq: Two times a day (BID) | ORAL | 0 refills | Status: DC
Start: 2023-01-09 — End: 2023-03-20

## 2023-01-09 NOTE — Progress Notes (Signed)
Date:  01/09/2023   Name:  HARMONIEE PALMISANO   DOB:  Apr 04, 1945   MRN:  [id number]   Chief Complaint: Cough (Started over a week ago. Patient was on a Zpack and cough medication. Cough- only bringing up a little mucous. No fever. No sob. )  Cough This is a recurrent problem. The problem has been waxing and waning. The problem occurs every few minutes. The cough is Non-productive. Associated symptoms include nasal congestion, postnasal drip and rhinorrhea. Pertinent negatives include no chest pain, chills, fever, headaches, hemoptysis, rash, shortness of breath, sweats or wheezing. Exacerbated by: mold. She has tried steroid inhaler, a beta-agonist inhaler and ipratropium inhaler for the symptoms. Her past medical history is significant for asthma, bronchitis and COPD.    Lab Results  Component Value Date   NA 143 12/02/2022   K 4.2 12/02/2022   CO2 24 12/02/2022   GLUCOSE 86 12/02/2022   BUN 10 12/02/2022   CREATININE 0.64 12/02/2022   CALCIUM 9.0 12/02/2022   EGFR 90 12/02/2022   GFRNONAA >60 03/31/2021   Lab Results  Component Value Date   CHOL 206 (H) 09/22/2022   HDL 105 09/22/2022   LDLCALC 81 09/22/2022   TRIG 117 09/22/2022   CHOLHDL 1.7 11/05/2018   Lab Results  Component Value Date   TSH 1.120 06/01/2017   No results found for: "HGBA1C" Lab Results  Component Value Date   WBC 8.0 09/25/2021   HGB 12.9 09/25/2021   HCT 39.1 09/25/2021   MCV 95 09/25/2021   PLT 289 09/25/2021   Lab Results  Component Value Date   ALT 21 05/28/2022   AST 26 05/28/2022   ALKPHOS 41 (L) 05/28/2022   BILITOT 0.3 05/28/2022   No results found for: "25OHVITD2", "25OHVITD3", "VD25OH"   Review of Systems  Constitutional:  Negative for chills and fever.  HENT:  Positive for postnasal drip and rhinorrhea.   Respiratory:  Positive for cough. Negative for hemoptysis, choking, chest tightness, shortness of breath and wheezing.   Cardiovascular:  Negative for chest pain and  palpitations.  Skin:  Negative for rash.  Neurological:  Negative for headaches.    Patient Active Problem List   Diagnosis Date Noted   History of colonic polyps 11/25/2022   Adenomatous polyp of cecum 11/25/2022   Rectal polyp 11/25/2022   Other synovitis and tenosynovitis, unspecified hand 03/13/2021   Inflammatory pain 03/13/2021   Ganglion cyst of finger of right hand 06/25/2020   Pain in finger of right hand 06/25/2020   Aortic atherosclerosis (HCC) 11/05/2018   COPD, moderate (HCC) 08/23/2015   Asthma, mild intermittent 06/07/2015   Hyperlipidemia 06/07/2015    No Known Allergies  Past Surgical History:  Procedure Laterality Date   ABDOMINAL HYSTERECTOMY     CATARACT EXTRACTION W/PHACO Left 10/09/2021   Procedure: CATARACT EXTRACTION PHACO AND INTRAOCULAR LENS PLACEMENT (IOC) LEFT 8.38 01:09.7;  Surgeon: Lockie Mola, MD;  Location: [facility];  Service: Ophthalmology;  Laterality: Left;   CATARACT EXTRACTION W/PHACO Right 10/23/2021   Procedure: CATARACT EXTRACTION PHACO AND INTRAOCULAR LENS PLACEMENT (IOC) RIGHT 5.51 00:48.8;  Surgeon: Lockie Mola, MD;  Location: [facility];  Service: Ophthalmology;  Laterality: Right;   COLONOSCOPY     COLONOSCOPY WITH PROPOFOL N/A 11/25/2022   Procedure: COLONOSCOPY WITH PROPOFOL;  Surgeon: Toney Reil, MD;  Location: [facility];  Service: Endoscopy;  Laterality: N/A;   POLYPECTOMY  11/25/2022   Procedure: POLYPECTOMY;  Surgeon: Toney Reil, MD;  Location: [facility];  Service: Endoscopy;;   TONSILLECTOMY     TUBAL LIGATION      Social History   Tobacco Use   Smoking status: Every Day    Current packs/day: 0.50    Average packs/day: 0.5 packs/day for 64.9 years (32.4 ttl pk-yrs)    Types: Cigarettes    Start date: 02/17/1958   Smokeless tobacco: Never   Tobacco comments:    aware needs to stop   Vaping Use   Vaping status: Never Used  Substance Use Topics    Alcohol use: Yes    Alcohol/week: 1.0 standard drink of alcohol    Types: 1 Glasses of wine per week   Drug use: No     Medication list has been reviewed and updated.  Current Meds  Medication Sig   aspirin EC 81 MG tablet Take 81 mg by mouth daily. Swallow whole.   Baclofen 5 MG TABS Take 5 mg by mouth as needed (for headache rescue).   lovastatin (MEVACOR) 20 MG tablet TAKE (1) TABLET BY MOUTH EVERY DAY   Melatonin 10 MG TABS Take 1 tablet by mouth at bedtime.   montelukast (SINGULAIR) 10 MG tablet TAKE (1) TABLET BY MOUTH EVERY DAY   Multiple Vitamins-Minerals (CENTRUM SILVER ADULT 50+ PO) Take 1 capsule by mouth daily at 6 (six) AM.   mupirocin ointment (BACTROBAN) 2 % Apply 1 Application topically 2 (two) times daily.   promethazine-dextromethorphan (PROMETHAZINE-DM) 6.25-15 MG/5ML syrup Take 5 mLs by mouth 4 (four) times daily as needed.       01/09/2023    8:08 AM 01/02/2023   10:18 AM 12/12/2022    3:19 PM 09/22/2022    8:33 AM  GAD 7 : Generalized Anxiety Score  Nervous, Anxious, on Edge 0 0 0 0  Control/stop worrying 0 0 0 0  Worry too much - different things 0 0 0 0  Trouble relaxing 0 0 0 0  Restless 0 0 0 0  Easily annoyed or irritable 0 0 0 0  Afraid - awful might happen 0 0 0 0  Total GAD 7 Score 0 0 0 0  Anxiety Difficulty Not difficult at all Not difficult at all Not difficult at all Not difficult at all       01/09/2023    8:07 AM 01/02/2023   10:18 AM 12/12/2022    3:19 PM  Depression screen PHQ 2/9  Decreased Interest 0 0 0  Down, Depressed, Hopeless 0 0 0  PHQ - 2 Score 0 0 0  Altered sleeping 0 0 0  Tired, decreased energy 0 0 0  Change in appetite 0 0 0  Feeling bad or failure about yourself  0 0 0  Trouble concentrating 0 0 0  Moving slowly or fidgety/restless 0 0 0  Suicidal thoughts 0 0 0  PHQ-9 Score 0 0 0  Difficult doing work/chores Not difficult at all Not difficult at all Not difficult at all    BP Readings from Last 3  Encounters:  01/09/23 122/74  01/02/23 128/78  12/12/22 130/82    Physical Exam Vitals and nursing note reviewed.  Constitutional:      Appearance: She is well-developed.  HENT:     Head: Normocephalic.     Right Ear: External ear normal.     Left Ear: External ear normal.  Eyes:     General: Lids are everted, no foreign bodies appreciated. No scleral icterus.       Left eye:  No foreign body or hordeolum.     Conjunctiva/sclera: Conjunctivae normal.     Right eye: Right conjunctiva is not injected.     Left eye: Left conjunctiva is not injected.     Pupils: Pupils are equal, round, and reactive to light.  Neck:     Thyroid: No thyromegaly.     Vascular: No JVD.     Trachea: No tracheal deviation.  Cardiovascular:     Rate and Rhythm: Normal rate and regular rhythm.     Heart sounds: Normal heart sounds. No murmur heard.    No friction rub. No gallop.  Pulmonary:     Effort: Pulmonary effort is normal. No respiratory distress.     Breath sounds: Normal breath sounds. No wheezing, rhonchi or rales.     Comments: Increase E/I Abdominal:     Palpations: Abdomen is soft.  Musculoskeletal:        General: No tenderness. Normal range of motion.     Cervical back: Normal range of motion and neck supple.  Lymphadenopathy:     Cervical: No cervical adenopathy.  Skin:    General: Skin is warm.     Findings: No rash.  Neurological:     Mental Status: She is alert and oriented to person, place, and time.     Cranial Nerves: No cranial nerve deficit.     Deep Tendon Reflexes: Reflexes normal.  Psychiatric:        Mood and Affect: Mood is not anxious or depressed.     Wt Readings from Last 3 Encounters:  01/09/23 158 lb (71.7 kg)  01/02/23 159 lb (72.1 kg)  12/12/22 161 lb 3.2 oz (73.1 kg)    BP 122/74   Pulse (!) 58   Ht 5\' 7"  (1.702 m)   Wt 158 lb (71.7 kg)   SpO2 96%   BMI 24.75 kg/m   Assessment and Plan:  1. Acute maxillary sinusitis, recurrence not  specified Acute.  Recurrent.  Persistent.  Patient continues to have symptomatology even though around day 8 of azithromycin.  I have encouraged her to let watch for another 24 hours and if not significantly better we will initiate Augmentin 875 mg twice a day as well is reinforced Flonase use. - amoxicillin-clavulanate (AUGMENTIN) 875-125 MG tablet; Take 1 tablet by mouth 2 (two) times daily.  Dispense: 20 tablet; Refill: 0 - fluticasone (FLONASE) 50 MCG/ACT nasal spray; Place 2 sprays into both nostrils daily.  Dispense: 16 g; Refill: 6  2. Seasonal allergic rhinitis due to pollen As noted above  3. Mild intermittent asthma with acute exacerbation Chronic.  Episodic.  Intermittent.  With mild exacerbation.  Since patient continues to have nonproductive cough and there is no wheezing but there is a increase of expiratory to inspiratory ratio we will initiate prednisone 10 mg once a day and include Tessalon Perles with control of cough along with use next DM. - benzonatate (TESSALON PERLES) 100 MG capsule; Take 1 capsule (100 mg total) by mouth 3 (three) times daily as needed for cough.  Dispense: 20 capsule; Refill: 0 - predniSONE (DELTASONE) 10 MG tablet; Take 1 tablet (10 mg total) by mouth daily with breakfast.  Dispense: 30 tablet; Refill: 0    Elizabeth Sauer, MD

## 2023-01-11 ENCOUNTER — Encounter: Payer: Self-pay | Admitting: Gastroenterology

## 2023-01-12 ENCOUNTER — Encounter: Payer: Self-pay | Admitting: Family Medicine

## 2023-01-12 MED ORDER — BUDESONIDE 3 MG PO CPEP: 3.0000 mg | ORAL_CAPSULE | Freq: Every morning | ORAL | 1 refills | Status: DC

## 2023-01-12 NOTE — Telephone Encounter (Signed)
Last office visit 12/02/2022 leg swelling and collagenous colitis  Plan: Caroline Harris is a 77 y.o. female with history of chronic tobacco use, restless leg syndrome, history of collagenous colitis previously treated with budesonide, currently in clinical remission  Last refill 06/26/2022 1 refills 90 capsule  Has appointment with [facility] on 03/18/2023  You approved her to restart the Budesonide on 12/08/2022 by mychart  but how many pills does she need to be taking

## 2023-01-13 DIAGNOSIS — R058 Other specified cough: Secondary | ICD-10-CM | POA: Diagnosis not present

## 2023-01-13 DIAGNOSIS — J439 Emphysema, unspecified: Secondary | ICD-10-CM | POA: Diagnosis not present

## 2023-01-13 DIAGNOSIS — F17218 Nicotine dependence, cigarettes, with other nicotine-induced disorders: Secondary | ICD-10-CM | POA: Diagnosis not present

## 2023-01-13 DIAGNOSIS — J22 Unspecified acute lower respiratory infection: Secondary | ICD-10-CM | POA: Diagnosis not present

## 2023-01-13 DIAGNOSIS — R059 Cough, unspecified: Secondary | ICD-10-CM | POA: Diagnosis not present

## 2023-01-13 DIAGNOSIS — J441 Chronic obstructive pulmonary disease with (acute) exacerbation: Secondary | ICD-10-CM | POA: Diagnosis not present

## 2023-03-12 DIAGNOSIS — Z860101 Personal history of adenomatous and serrated colon polyps: Secondary | ICD-10-CM | POA: Diagnosis not present

## 2023-03-12 DIAGNOSIS — K52831 Collagenous colitis: Secondary | ICD-10-CM | POA: Diagnosis not present

## 2023-03-17 DIAGNOSIS — K5900 Constipation, unspecified: Secondary | ICD-10-CM | POA: Diagnosis not present

## 2023-03-17 DIAGNOSIS — K52831 Collagenous colitis: Secondary | ICD-10-CM | POA: Diagnosis not present

## 2023-03-20 ENCOUNTER — Ambulatory Visit
Admission: RE | Admit: 2023-03-20 | Discharge: 2023-03-20 | Disposition: A | Discharge status: Home / Self Care | Payer: Medicare HMO | Source: Ambulatory Visit | Attending: Family Medicine | Admitting: Family Medicine

## 2023-03-20 ENCOUNTER — Ambulatory Visit
Admission: RE | Admit: 2023-03-20 | Discharge: 2023-03-20 | Disposition: A | Discharge status: Home / Self Care | Payer: Medicare HMO | Attending: Family Medicine | Admitting: Family Medicine

## 2023-03-20 ENCOUNTER — Ambulatory Visit (INDEPENDENT_AMBULATORY_CARE_PROVIDER_SITE_OTHER): Payer: Medicare HMO | Admitting: Family Medicine

## 2023-03-20 ENCOUNTER — Encounter: Payer: Self-pay | Admitting: Family Medicine

## 2023-03-20 VITALS — BP 124/76 | HR 74 | Ht 67.0 in | Wt 162.0 lb

## 2023-03-20 DIAGNOSIS — R5383 Other fatigue: Secondary | ICD-10-CM | POA: Diagnosis not present

## 2023-03-20 DIAGNOSIS — M4316 Spondylolisthesis, lumbar region: Secondary | ICD-10-CM | POA: Diagnosis not present

## 2023-03-20 DIAGNOSIS — E785 Hyperlipidemia, unspecified: Secondary | ICD-10-CM | POA: Diagnosis not present

## 2023-03-20 DIAGNOSIS — N3090 Cystitis, unspecified without hematuria: Secondary | ICD-10-CM | POA: Diagnosis not present

## 2023-03-20 DIAGNOSIS — M5136 Other intervertebral disc degeneration, lumbar region with discogenic back pain only: Secondary | ICD-10-CM | POA: Diagnosis not present

## 2023-03-20 DIAGNOSIS — M8580 Other specified disorders of bone density and structure, unspecified site: Secondary | ICD-10-CM | POA: Diagnosis not present

## 2023-03-20 DIAGNOSIS — M545 Low back pain, unspecified: Secondary | ICD-10-CM | POA: Diagnosis not present

## 2023-03-20 DIAGNOSIS — M47816 Spondylosis without myelopathy or radiculopathy, lumbar region: Secondary | ICD-10-CM | POA: Diagnosis not present

## 2023-03-20 LAB — POCT URINALYSIS DIPSTICK
Bilirubin, UA: NEGATIVE
Glucose, UA: NEGATIVE
Ketones, UA: NEGATIVE
Nitrite, UA: NEGATIVE
Protein, UA: POSITIVE — AB
Spec Grav, UA: 1.01 (ref 1.010–1.025)
Urobilinogen, UA: 0.2 U/dL
pH, UA: 7 (ref 5.0–8.0)

## 2023-03-20 MED ORDER — LOVASTATIN 20 MG PO TABS: ORAL_TABLET | ORAL | 1 refills | Status: AC

## 2023-03-20 MED ORDER — NITROFURANTOIN MONOHYD MACRO 100 MG PO CAPS
100.0000 mg | ORAL_CAPSULE | Freq: Two times a day (BID) | ORAL | 0 refills | Status: AC
Start: 2023-03-20 — End: 2023-03-27

## 2023-03-20 NOTE — Progress Notes (Signed)
Date:  03/20/2023   Name:  Caroline Harris   DOB:  05-Jan-1946   MRN:  [id number]   Chief Complaint: Back Pain (Lower back pain. Can radiate up into shoulders. Heating pad helps. Started X 1 week. ), Hyperlipidemia (Labs.), and Edema (Swelling in both ankles. Started x 1 week.)  Back Pain This is a new problem. The current episode started 1 to 4 weeks ago. The problem occurs intermittently. The problem has been gradually improving since onset. The pain is present in the lumbar spine. The quality of the pain is described as aching. The pain does not radiate. The symptoms are aggravated by bending, lying down, sitting and stress. Stiffness is present At night. Pertinent negatives include no abdominal pain, chest pain, fever, leg pain, numbness or paresthesias.  Hyperlipidemia This is a chronic problem. The current episode started more than 1 year ago. The problem is controlled. Recent lipid tests were reviewed and are normal. She has no history of chronic renal disease, diabetes, hypothyroidism or obesity. Pertinent negatives include no chest pain, focal sensory loss, focal weakness, leg pain, myalgias or shortness of breath. Current antihyperlipidemic treatment includes statins. The current treatment provides moderate improvement of lipids. There are no compliance problems.  Risk factors for coronary artery disease include dyslipidemia.    Lab Results  Component Value Date   NA 143 12/02/2022   K 4.2 12/02/2022   CO2 24 12/02/2022   GLUCOSE 86 12/02/2022   BUN 10 12/02/2022   CREATININE 0.64 12/02/2022   CALCIUM 9.0 12/02/2022   EGFR 90 12/02/2022   GFRNONAA >60 03/31/2021   Lab Results  Component Value Date   CHOL 206 (H) 09/22/2022   HDL 105 09/22/2022   LDLCALC 81 09/22/2022   TRIG 117 09/22/2022   CHOLHDL 1.7 11/05/2018   Lab Results  Component Value Date   TSH 1.120 06/01/2017   No results found for: "HGBA1C" Lab Results  Component Value Date   WBC 8.0 09/25/2021   HGB  12.9 09/25/2021   HCT 39.1 09/25/2021   MCV 95 09/25/2021   PLT 289 09/25/2021   Lab Results  Component Value Date   ALT 21 05/28/2022   AST 26 05/28/2022   ALKPHOS 41 (L) 05/28/2022   BILITOT 0.3 05/28/2022   No results found for: "25OHVITD2", "25OHVITD3", "VD25OH"   Review of Systems  Constitutional:  Negative for chills and fever.  HENT:  Negative for ear discharge, hearing loss and mouth sores.   Respiratory:  Negative for choking, chest tightness, shortness of breath, wheezing and stridor.   Cardiovascular:  Negative for chest pain, palpitations and leg swelling.  Gastrointestinal:  Negative for abdominal pain and blood in stool.  Genitourinary:  Negative for difficulty urinating.  Musculoskeletal:  Positive for back pain. Negative for myalgias.  Neurological:  Negative for focal weakness, numbness and paresthesias.    Patient Active Problem List   Diagnosis Date Noted   History of colonic polyps 11/25/2022   Adenomatous polyp of cecum 11/25/2022   Rectal polyp 11/25/2022   Other synovitis and tenosynovitis, unspecified hand 03/13/2021   Inflammatory pain 03/13/2021   Ganglion cyst of finger of right hand 06/25/2020   Pain in finger of right hand 06/25/2020   Aortic atherosclerosis (HCC) 11/05/2018   COPD, moderate (HCC) 08/23/2015   Asthma, mild intermittent 06/07/2015   Hyperlipidemia 06/07/2015    No Known Allergies  Past Surgical History:  Procedure Laterality Date   ABDOMINAL HYSTERECTOMY     CATARACT EXTRACTION  W/PHACO Left 10/09/2021   Procedure: CATARACT EXTRACTION PHACO AND INTRAOCULAR LENS PLACEMENT (IOC) LEFT 8.38 01:09.7;  Surgeon: Lockie Mola, MD;  Location: [facility];  Service: Ophthalmology;  Laterality: Left;   CATARACT EXTRACTION W/PHACO Right 10/23/2021   Procedure: CATARACT EXTRACTION PHACO AND INTRAOCULAR LENS PLACEMENT (IOC) RIGHT 5.51 00:48.8;  Surgeon: Lockie Mola, MD;  Location: [facility];  Service:  Ophthalmology;  Laterality: Right;   COLONOSCOPY     COLONOSCOPY WITH PROPOFOL N/A 11/25/2022   Procedure: COLONOSCOPY WITH PROPOFOL;  Surgeon: Toney Reil, MD;  Location: [facility];  Service: Endoscopy;  Laterality: N/A;   POLYPECTOMY  11/25/2022   Procedure: POLYPECTOMY;  Surgeon: Toney Reil, MD;  Location: [facility];  Service: Endoscopy;;   TONSILLECTOMY     TUBAL LIGATION      Social History   Tobacco Use   Smoking status: Every Day    Current packs/day: 0.50    Average packs/day: 0.5 packs/day for 65.1 years (32.5 ttl pk-yrs)    Types: Cigarettes    Start date: 02/17/1958   Smokeless tobacco: Never   Tobacco comments:    aware needs to stop   Vaping Use   Vaping status: Never Used  Substance Use Topics   Alcohol use: Yes    Alcohol/week: 1.0 standard drink of alcohol    Types: 1 Glasses of wine per week   Drug use: No     Medication list has been reviewed and updated.  Current Meds  Medication Sig   aspirin EC 81 MG tablet Take 81 mg by mouth daily. Swallow whole.   Baclofen 5 MG TABS Take 5 mg by mouth as needed (for headache rescue).   loperamide (IMODIUM A-D) 2 MG tablet Take 2 mg by mouth 4 (four) times daily as needed for diarrhea or loose stools.   lovastatin (MEVACOR) 20 MG tablet TAKE (1) TABLET BY MOUTH EVERY DAY   Melatonin 10 MG TABS Take 1 tablet by mouth at bedtime.   montelukast (SINGULAIR) 10 MG tablet TAKE (1) TABLET BY MOUTH EVERY DAY   Multiple Vitamins-Minerals (CENTRUM SILVER ADULT 50+ PO) Take 1 capsule by mouth daily at 6 (six) AM.   mupirocin ointment (BACTROBAN) 2 % Apply 1 Application topically 2 (two) times daily.       01/09/2023    8:08 AM 01/02/2023   10:18 AM 12/12/2022    3:19 PM 09/22/2022    8:33 AM  GAD 7 : Generalized Anxiety Score  Nervous, Anxious, on Edge 0 0 0 0  Control/stop worrying 0 0 0 0  Worry too much - different things 0 0 0 0  Trouble relaxing 0 0 0 0  Restless 0 0 0 0  Easily  annoyed or irritable 0 0 0 0  Afraid - awful might happen 0 0 0 0  Total GAD 7 Score 0 0 0 0  Anxiety Difficulty Not difficult at all Not difficult at all Not difficult at all Not difficult at all       01/09/2023    8:07 AM 01/02/2023   10:18 AM 12/12/2022    3:19 PM  Depression screen PHQ 2/9  Decreased Interest 0 0 0  Down, Depressed, Hopeless 0 0 0  PHQ - 2 Score 0 0 0  Altered sleeping 0 0 0  Tired, decreased energy 0 0 0  Change in appetite 0 0 0  Feeling bad or failure about yourself  0 0 0  Trouble concentrating 0 0  0  Moving slowly or fidgety/restless 0 0 0  Suicidal thoughts 0 0 0  PHQ-9 Score 0 0 0  Difficult doing work/chores Not difficult at all Not difficult at all Not difficult at all    BP Readings from Last 3 Encounters:  03/20/23 124/76  01/09/23 122/74  01/02/23 128/78    Physical Exam Vitals and nursing note reviewed.  Constitutional:      General: She is not in acute distress.    Appearance: She is not diaphoretic.  HENT:     Head: Normocephalic and atraumatic.     Right Ear: Tympanic membrane and external ear normal.     Left Ear: Tympanic membrane and external ear normal.     Nose: Nose normal. No congestion or rhinorrhea.     Mouth/Throat:     Mouth: Mucous membranes are moist.  Eyes:     General:        Right eye: No discharge.        Left eye: No discharge.     Conjunctiva/sclera: Conjunctivae normal.     Pupils: Pupils are equal, round, and reactive to light.  Neck:     Thyroid: No thyromegaly.     Vascular: No JVD.  Cardiovascular:     Rate and Rhythm: Normal rate and regular rhythm.     Heart sounds: Normal heart sounds. No murmur heard.    No friction rub. No gallop.  Pulmonary:     Effort: Pulmonary effort is normal.     Breath sounds: Normal breath sounds. No wheezing, rhonchi or rales.  Abdominal:     General: Bowel sounds are normal.     Palpations: Abdomen is soft. There is no mass.     Tenderness: There is no abdominal  tenderness. There is no guarding.  Musculoskeletal:        General: Normal range of motion.     Cervical back: Normal range of motion and neck supple.  Lymphadenopathy:     Cervical: No cervical adenopathy.  Skin:    General: Skin is warm and dry.  Neurological:     Mental Status: She is alert.     Deep Tendon Reflexes: Reflexes are normal and symmetric.     Wt Readings from Last 3 Encounters:  03/20/23 162 lb (73.5 kg)  01/09/23 158 lb (71.7 kg)  01/02/23 159 lb (72.1 kg)    BP 124/76   Pulse 74   Ht 5\' 7"  (1.702 m)   Wt 162 lb (73.5 kg)   SpO2 98%   BMI 25.37 kg/m   Assessment and Plan:  1. Hyperlipidemia, unspecified hyperlipidemia type (Primary) Chronic.  Controlled.  Stable.  Currently on lovastatin 20 mg once a day.  Will check lipid panel for current level of control. - Lipid panel  2. Fatigue, unspecified type Chronic.  But a recent exacerbation and on the last evaluation we noted that hemoglobin was at 13 so we will recheck a CBC along with CMP for electrolytes. - CBC with Differential/Platelet - Comprehensive metabolic panel  3. Lumbar back pain New onset.  Low back pain associated with localized in the lumbar area without radiculopathy symptomatology.  Given that she has osteopenia we will check an LS spine to rule out the possibility of compression fracture. - DG Lumbar Spine Complete  4. Cystitis New onset.  Persistent.  Symptomatic with frequency urgency.  There is no suprapubic or CVA tenderness.  Urinalysis notes leukocytes but unable to do culture.  We will treat with  nitrofurantoin 100 mg twice a day for 7 days. - POCT urinalysis dipstick - nitrofurantoin, macrocrystal-monohydrate, (MACROBID) 100 MG capsule; Take 1 capsule (100 mg total) by mouth 2 (two) times daily for 7 days.  Dispense: 14 capsule; Refill: 0    Elizabeth Sauer, MD

## 2023-03-23 ENCOUNTER — Ambulatory Visit: Payer: Medicare HMO | Admitting: Family Medicine

## 2023-03-23 DIAGNOSIS — R5383 Other fatigue: Secondary | ICD-10-CM | POA: Diagnosis not present

## 2023-03-23 DIAGNOSIS — E785 Hyperlipidemia, unspecified: Secondary | ICD-10-CM | POA: Diagnosis not present

## 2023-03-24 ENCOUNTER — Encounter: Payer: Self-pay | Admitting: Family Medicine

## 2023-03-24 LAB — COMPREHENSIVE METABOLIC PANEL
ALT: 13 [IU]/L (ref 0–32)
AST: 22 [IU]/L (ref 0–40)
Albumin: 3.8 g/dL (ref 3.8–4.8)
Alkaline Phosphatase: 47 [IU]/L (ref 44–121)
BUN/Creatinine Ratio: 22 (ref 12–28)
BUN: 14 mg/dL (ref 8–27)
Bilirubin Total: 0.2 mg/dL (ref 0.0–1.2)
CO2: 24 mmol/L (ref 20–29)
Calcium: 9.4 mg/dL (ref 8.7–10.3)
Chloride: 102 mmol/L (ref 96–106)
Creatinine, Ser: 0.64 mg/dL (ref 0.57–1.00)
Globulin, Total: 2.1 g/dL (ref 1.5–4.5)
Glucose: 86 mg/dL (ref 70–99)
Potassium: 4.5 mmol/L (ref 3.5–5.2)
Sodium: 141 mmol/L (ref 134–144)
Total Protein: 5.9 g/dL — ABNORMAL LOW (ref 6.0–8.5)
eGFR: 90 mL/min/{1.73_m2} (ref >59)

## 2023-03-24 LAB — CBC WITH DIFFERENTIAL/PLATELET
Basophils Absolute: 0.1 10*3/uL (ref 0.0–0.2)
Basos: 1 %
EOS (ABSOLUTE): 0.2 10*3/uL (ref 0.0–0.4)
Eos: 4 %
Hematocrit: 38.1 % (ref 34.0–46.6)
Hemoglobin: 12.7 g/dL (ref 11.1–15.9)
Immature Grans (Abs): 0 10*3/uL (ref 0.0–0.1)
Immature Granulocytes: 0 %
Lymphocytes Absolute: 1.4 10*3/uL (ref 0.7–3.1)
Lymphs: 31 %
MCH: 31.4 pg (ref 26.6–33.0)
MCHC: 33.3 g/dL (ref 31.5–35.7)
MCV: 94 fL (ref 79–97)
Monocytes Absolute: 0.5 10*3/uL (ref 0.1–0.9)
Monocytes: 10 %
Neutrophils Absolute: 2.4 10*3/uL (ref 1.4–7.0)
Neutrophils: 54 %
Platelets: 312 10*3/uL (ref 150–450)
RBC: 4.04 x10E6/uL (ref 3.77–5.28)
RDW: 12.9 % (ref 11.7–15.4)
WBC: 4.5 10*3/uL (ref 3.4–10.8)

## 2023-03-24 LAB — LIPID PANEL
Chol/HDL Ratio: 2.6 {ratio} (ref 0.0–4.4)
Cholesterol, Total: 160 mg/dL (ref 100–199)
HDL: 61 mg/dL (ref >39)
LDL Chol Calc (NIH): 82 mg/dL (ref 0–99)
Triglycerides: 94 mg/dL (ref 0–149)
VLDL Cholesterol Cal: 17 mg/dL (ref 5–40)

## 2023-03-25 DIAGNOSIS — R198 Other specified symptoms and signs involving the digestive system and abdomen: Secondary | ICD-10-CM | POA: Diagnosis not present

## 2023-03-25 DIAGNOSIS — K5989 Other specified functional intestinal disorders: Secondary | ICD-10-CM | POA: Diagnosis not present

## 2023-03-25 DIAGNOSIS — K52831 Collagenous colitis: Secondary | ICD-10-CM | POA: Diagnosis not present

## 2023-03-27 ENCOUNTER — Ambulatory Visit: Payer: Medicare HMO | Admitting: Family Medicine

## 2023-03-31 ENCOUNTER — Encounter: Payer: Self-pay | Admitting: Family Medicine

## 2023-03-31 ENCOUNTER — Other Ambulatory Visit: Payer: Self-pay

## 2023-03-31 DIAGNOSIS — Z1231 Encounter for screening mammogram for malignant neoplasm of breast: Secondary | ICD-10-CM | POA: Diagnosis not present

## 2023-03-31 DIAGNOSIS — M545 Low back pain, unspecified: Secondary | ICD-10-CM

## 2023-03-31 LAB — HM MAMMOGRAPHY

## 2023-04-06 ENCOUNTER — Telehealth: Payer: Self-pay | Admitting: Family Medicine

## 2023-04-06 DIAGNOSIS — J301 Allergic rhinitis due to pollen: Secondary | ICD-10-CM | POA: Diagnosis not present

## 2023-04-06 DIAGNOSIS — F17210 Nicotine dependence, cigarettes, uncomplicated: Secondary | ICD-10-CM | POA: Diagnosis not present

## 2023-04-06 DIAGNOSIS — J449 Chronic obstructive pulmonary disease, unspecified: Secondary | ICD-10-CM | POA: Diagnosis not present

## 2023-04-06 NOTE — Telephone Encounter (Signed)
 Copied from CRM [id number]. Topic: Medicare AWV >> Apr 06, 2023  9:21 AM Payton Doughty wrote: Reason for CRM: Please confirm appt time change from 9:15am to 10:10am on 06/03/2023 [facility]  Verlee Rossetti; Care Guide Ambulatory Clinical Support [place] [facility] Direct Dial: [phone]

## 2023-04-17 DIAGNOSIS — G8929 Other chronic pain: Secondary | ICD-10-CM | POA: Diagnosis not present

## 2023-04-17 DIAGNOSIS — M5136 Other intervertebral disc degeneration, lumbar region with discogenic back pain only: Secondary | ICD-10-CM | POA: Diagnosis not present

## 2023-05-05 ENCOUNTER — Ambulatory Visit: Payer: Self-pay | Admitting: Family Medicine

## 2023-05-05 ENCOUNTER — Ambulatory Visit (INDEPENDENT_AMBULATORY_CARE_PROVIDER_SITE_OTHER): Admitting: Family Medicine

## 2023-05-05 ENCOUNTER — Encounter: Payer: Self-pay | Admitting: Family Medicine

## 2023-05-05 VITALS — BP 142/90 | HR 70 | Ht 67.0 in | Wt 161.1 lb

## 2023-05-05 DIAGNOSIS — J4520 Mild intermittent asthma, uncomplicated: Secondary | ICD-10-CM

## 2023-05-05 DIAGNOSIS — R030 Elevated blood-pressure reading, without diagnosis of hypertension: Secondary | ICD-10-CM

## 2023-05-05 MED ORDER — HYDROCHLOROTHIAZIDE 12.5 MG PO TABS: 12.5000 mg | ORAL_TABLET | Freq: Every day | ORAL | 3 refills | Status: DC

## 2023-05-05 MED ORDER — MONTELUKAST SODIUM 10 MG PO TABS: ORAL_TABLET | ORAL | 3 refills | Status: AC

## 2023-05-05 NOTE — Progress Notes (Signed)
 Date:  05/05/2023   Name:  Caroline Harris   DOB:  24-Sep-1945   MRN:  [id number]   Chief Complaint: Joint Swelling (Patient said she is having ankle swelling, both ankles)  Hypertension This is a chronic problem. The current episode started more than 1 year ago. The problem has been waxing and waning since onset. The problem is uncontrolled. Pertinent negatives include no anxiety, blurred vision, chest pain, headaches, neck pain, orthopnea, palpitations, PND or shortness of breath. There are no associated agents to hypertension. Past treatments include lifestyle changes. The current treatment provides mild improvement. There are no compliance problems.     Lab Results  Component Value Date   NA 141 03/23/2023   K 4.5 03/23/2023   CO2 24 03/23/2023   GLUCOSE 86 03/23/2023   BUN 14 03/23/2023   CREATININE 0.64 03/23/2023   CALCIUM 9.4 03/23/2023   EGFR 90 03/23/2023   GFRNONAA >60 03/31/2021   Lab Results  Component Value Date   CHOL 160 03/23/2023   HDL 61 03/23/2023   LDLCALC 82 03/23/2023   TRIG 94 03/23/2023   CHOLHDL 2.6 03/23/2023   Lab Results  Component Value Date   TSH 1.120 06/01/2017   No results found for: "HGBA1C" Lab Results  Component Value Date   WBC 4.5 03/23/2023   HGB 12.7 03/23/2023   HCT 38.1 03/23/2023   MCV 94 03/23/2023   PLT 312 03/23/2023   Lab Results  Component Value Date   ALT 13 03/23/2023   AST 22 03/23/2023   ALKPHOS 47 03/23/2023   BILITOT 0.2 03/23/2023   No results found for: "25OHVITD2", "25OHVITD3", "VD25OH"   Review of Systems  Constitutional: Negative.  Negative for chills, fatigue, fever and unexpected weight change.  HENT:  Negative for congestion, ear discharge, ear pain, rhinorrhea, sinus pressure, sneezing and sore throat.   Eyes:  Negative for blurred vision.  Respiratory:  Negative for cough, shortness of breath, wheezing and stridor.   Cardiovascular:  Positive for leg swelling. Negative for chest pain,  palpitations, orthopnea and PND.  Gastrointestinal:  Negative for abdominal pain, blood in stool, constipation, diarrhea and nausea.  Genitourinary:  Negative for dysuria, flank pain, frequency, hematuria, urgency and vaginal discharge.  Musculoskeletal:  Negative for arthralgias, back pain, myalgias and neck pain.  Skin:  Negative for rash.  Neurological:  Negative for dizziness, weakness and headaches.  Hematological:  Negative for adenopathy. Does not bruise/bleed easily.  Psychiatric/Behavioral:  Negative for dysphoric mood. The patient is not nervous/anxious.     Patient Active Problem List   Diagnosis Date Noted   Lumbar back pain 03/20/2023   History of colonic polyps 11/25/2022   Adenomatous polyp of cecum 11/25/2022   Rectal polyp 11/25/2022   Other synovitis and tenosynovitis, unspecified hand 03/13/2021   Inflammatory pain 03/13/2021   Ganglion cyst of finger of right hand 06/25/2020   Pain in finger of right hand 06/25/2020   Aortic atherosclerosis (HCC) 11/05/2018   COPD, moderate (HCC) 08/23/2015   Asthma, mild intermittent 06/07/2015   Hyperlipidemia 06/07/2015    No Known Allergies  Past Surgical History:  Procedure Laterality Date   ABDOMINAL HYSTERECTOMY     CATARACT EXTRACTION W/PHACO Left 10/09/2021   Procedure: CATARACT EXTRACTION PHACO AND INTRAOCULAR LENS PLACEMENT (IOC) LEFT 8.38 01:09.7;  Surgeon: Lockie Mola, MD;  Location: [facility];  Service: Ophthalmology;  Laterality: Left;   CATARACT EXTRACTION W/PHACO Right 10/23/2021   Procedure: CATARACT EXTRACTION PHACO AND INTRAOCULAR LENS PLACEMENT (  IOC) RIGHT 5.51 00:48.8;  Surgeon: Lockie Mola, MD;  Location: [facility];  Service: Ophthalmology;  Laterality: Right;   COLONOSCOPY     COLONOSCOPY WITH PROPOFOL N/A 11/25/2022   Procedure: COLONOSCOPY WITH PROPOFOL;  Surgeon: Toney Reil, MD;  Location: [facility];  Service: Endoscopy;  Laterality: N/A;    POLYPECTOMY  11/25/2022   Procedure: POLYPECTOMY;  Surgeon: Toney Reil, MD;  Location: [facility];  Service: Endoscopy;;   TONSILLECTOMY     TUBAL LIGATION      Social History   Tobacco Use   Smoking status: Every Day    Current packs/day: 0.50    Average packs/day: 0.5 packs/day for 65.2 years (32.6 ttl pk-yrs)    Types: Cigarettes    Start date: 02/17/1958   Smokeless tobacco: Never   Tobacco comments:    aware needs to stop   Vaping Use   Vaping status: Never Used  Substance Use Topics   Alcohol use: Yes    Alcohol/week: 1.0 standard drink of alcohol    Types: 1 Glasses of wine per week   Drug use: No     Medication list has been reviewed and updated.  No outpatient medications have been marked as taking for the 05/05/23 encounter (Office Visit) with Duanne Limerick, MD.       05/05/2023   11:23 AM 01/09/2023    8:08 AM 01/02/2023   10:18 AM 12/12/2022    3:19 PM  GAD 7 : Generalized Anxiety Score  Nervous, Anxious, on Edge 0 0 0 0  Control/stop worrying 0 0 0 0  Worry too much - different things 0 0 0 0  Trouble relaxing 0 0 0 0  Restless 0 0 0 0  Easily annoyed or irritable 0 0 0 0  Afraid - awful might happen 0 0 0 0  Total GAD 7 Score 0 0 0 0  Anxiety Difficulty Not difficult at all Not difficult at all Not difficult at all Not difficult at all       05/05/2023   11:22 AM 01/09/2023    8:07 AM 01/02/2023   10:18 AM  Depression screen PHQ 2/9  Decreased Interest 0 0 0  Down, Depressed, Hopeless 0 0 0  PHQ - 2 Score 0 0 0  Altered sleeping 1 0 0  Tired, decreased energy 1 0 0  Change in appetite 0 0 0  Feeling bad or failure about yourself  0 0 0  Trouble concentrating 0 0 0  Moving slowly or fidgety/restless 0 0 0  Suicidal thoughts 0 0 0  PHQ-9 Score 2 0 0  Difficult doing work/chores Not difficult at all Not difficult at all Not difficult at all    BP Readings from Last 3 Encounters:  05/05/23 (!) 142/90  03/20/23 124/76   01/09/23 122/74    Physical Exam Vitals and nursing note reviewed.  HENT:     Right Ear: Tympanic membrane and ear canal normal.     Left Ear: Tympanic membrane and ear canal normal.     Nose: Nose normal.     Mouth/Throat:     Mouth: Mucous membranes are moist.     Pharynx: No oropharyngeal exudate or posterior oropharyngeal erythema.  Cardiovascular:     Rate and Rhythm: Normal rate.     Heart sounds: No murmur heard.    No friction rub. No gallop.  Pulmonary:     Breath sounds: No wheezing, rhonchi or rales.  Abdominal:  General: Bowel sounds are normal.     Tenderness: There is no abdominal tenderness.  Musculoskeletal:     Cervical back: Normal range of motion.     Wt Readings from Last 3 Encounters:  05/05/23 161 lb 2 oz (73.1 kg)  03/20/23 162 lb (73.5 kg)  01/09/23 158 lb (71.7 kg)    BP (!) 142/90   Pulse 70   Ht 5\' 7"  (1.702 m)   Wt 161 lb 2 oz (73.1 kg)   SpO2 99%   BMI 25.24 kg/m   Assessment and Plan:  1. Elevated blood pressure, situational (Primary) Chronic.  Uncontrolled.  Stable.  Blood pressure today 142/90.  Patient's been having intermittent elevated blood pressure readings in given that she is also having some leg swelling which may be dependent edema versus volume overload we will initiate hydrochlorothiazide 12.5 mg once a day. - hydrochlorothiazide (HYDRODIURIL) 12.5 MG tablet; Take 1 tablet (12.5 mg total) by mouth daily.  Dispense: 90 tablet; Refill: 3  2. Mild intermittent asthma, unspecified whether complicated Chronic.  Episodic.  Intermittent and mild.  Patient would like refills on her Singulair given that were into the pollen season. - montelukast (SINGULAIR) 10 MG tablet; TAKE (1) TABLET BY MOUTH EVERY DAY  Dispense: 90 tablet; Refill: 3    Elizabeth Sauer, MD

## 2023-05-05 NOTE — Telephone Encounter (Signed)
 Copied from CRM [id number]. Topic: Clinical - Red Word Triage >> May 05, 2023 10:42 AM Shon Hale wrote: Red Word that prompted transfer to Nurse Triage: Swelling in both ankles    Chief Complaint: Ankle Swelling Symptoms: No other symptoms reported Frequency: Ongoing since January Pertinent Negatives: Patient denies pain, numbness, tingling Disposition: [] ED /[] Urgent Care (no appt availability in office) / [x] Appointment(In office/virtual)/ []  [place] [facility]/ [] Home Care/ [] Refused Recommended Disposition /[] [place] [facility]/ []  Follow-up with PCP Additional Notes: Patient stated she has been having ankle swelling since January. Patient stated that Dr. Yetta Barre has seen the swelling before and was not concerned about it. Patient denies worsening or new symptoms, but she is concerned because the swelling has not went away. Patient requesting an appointment. Appointment scheduled for this morning.   Reason for Disposition  Ankle swelling is a chronic symptom (recurrent or ongoing AND present > 4 weeks)  Answer Assessment - Initial Assessment Questions 1. LOCATION:  "Where is the swelling?"     Ankles  2. ONSET: "When did the swelling start?"     January    3. PAIN: "Is there any pain?" If Yes, ask: "How bad is it?" (Scale 1-10; or mild, moderate, severe)   - NONE (0): no pain.   - MILD (1-3): doesn't interfere with normal activities.    - MODERATE (4-7): interferes with normal activities (e.g., work or school) or awakens from sleep, limping.    - SEVERE (8-10): excruciating pain, unable to do any normal activities, unable to walk.      No  4. CAUSE: "What do you think caused the ankle swelling?"     Unknown  5. OTHER SYMPTOMS: "Do you have any other symptoms?"      No  Protocols used: Ankle Swelling-A-AH

## 2023-05-05 NOTE — Telephone Encounter (Signed)
 Noted  Pt has a appt.  KP

## 2023-05-23 ENCOUNTER — Encounter: Payer: Self-pay | Admitting: Family Medicine

## 2023-06-01 ENCOUNTER — Encounter: Payer: Self-pay | Admitting: Family Medicine

## 2023-06-01 ENCOUNTER — Ambulatory Visit (INDEPENDENT_AMBULATORY_CARE_PROVIDER_SITE_OTHER): Admitting: Family Medicine

## 2023-06-01 VITALS — BP 116/74 | HR 68 | Resp 16 | Ht 67.0 in | Wt 161.2 lb

## 2023-06-01 DIAGNOSIS — I872 Venous insufficiency (chronic) (peripheral): Secondary | ICD-10-CM | POA: Diagnosis not present

## 2023-06-01 NOTE — Progress Notes (Signed)
 Date:  06/01/2023   Name:  Caroline Harris   DOB:  06/25/1945   MRN:  [id number]   Chief Complaint: Edema  Patient is a 78 year old female who presents for a lower extremeties L>R exam. The patient reports the following problems: suspect likely venous insuffiency. Health maintenance has been reviewed up to date    Other This is a chronic problem. The current episode started more than 1 year ago. The problem occurs daily. The problem has been waxing and waning. Pertinent negatives include no abdominal pain, change in bowel habit, chest pain, congestion, coughing, joint swelling, nausea, urinary symptoms, vertigo or visual change. The symptoms are aggravated by smoking. Treatments tried: otc compression socks/diuretic. The treatment provided no relief.    Lab Results  Component Value Date   NA 141 03/23/2023   K 4.5 03/23/2023   CO2 24 03/23/2023   GLUCOSE 86 03/23/2023   BUN 14 03/23/2023   CREATININE 0.64 03/23/2023   CALCIUM 9.4 03/23/2023   EGFR 90 03/23/2023   GFRNONAA >60 03/31/2021   Lab Results  Component Value Date   CHOL 160 03/23/2023   HDL 61 03/23/2023   LDLCALC 82 03/23/2023   TRIG 94 03/23/2023   CHOLHDL 2.6 03/23/2023   Lab Results  Component Value Date   TSH 1.120 06/01/2017   No results found for: "HGBA1C" Lab Results  Component Value Date   WBC 4.5 03/23/2023   HGB 12.7 03/23/2023   HCT 38.1 03/23/2023   MCV 94 03/23/2023   PLT 312 03/23/2023   Lab Results  Component Value Date   ALT 13 03/23/2023   AST 22 03/23/2023   ALKPHOS 47 03/23/2023   BILITOT 0.2 03/23/2023   No results found for: "25OHVITD2", "25OHVITD3", "VD25OH"   Review of Systems  HENT:  Negative for congestion, drooling, ear discharge, postnasal drip, rhinorrhea and sinus pain.   Respiratory:  Negative for cough, choking, chest tightness, shortness of breath, wheezing and stridor.   Cardiovascular:  Negative for chest pain, palpitations and leg swelling.  Gastrointestinal:   Negative for abdominal pain, blood in stool, change in bowel habit, constipation, diarrhea and nausea.  Musculoskeletal:  Negative for joint swelling.  Neurological:  Negative for vertigo.    Patient Active Problem List   Diagnosis Date Noted   Lumbar back pain 03/20/2023   History of colonic polyps 11/25/2022   Adenomatous polyp of cecum 11/25/2022   Rectal polyp 11/25/2022   Other synovitis and tenosynovitis, unspecified hand 03/13/2021   Inflammatory pain 03/13/2021   Ganglion cyst of finger of right hand 06/25/2020   Pain in finger of right hand 06/25/2020   Aortic atherosclerosis (HCC) 11/05/2018   COPD, moderate (HCC) 08/23/2015   Asthma, mild intermittent 06/07/2015   Hyperlipidemia 06/07/2015    No Known Allergies  Past Surgical History:  Procedure Laterality Date   ABDOMINAL HYSTERECTOMY     CATARACT EXTRACTION W/PHACO Left 10/09/2021   Procedure: CATARACT EXTRACTION PHACO AND INTRAOCULAR LENS PLACEMENT (IOC) LEFT 8.38 01:09.7;  Surgeon: Lockie Mola, MD;  Location: [facility];  Service: Ophthalmology;  Laterality: Left;   CATARACT EXTRACTION W/PHACO Right 10/23/2021   Procedure: CATARACT EXTRACTION PHACO AND INTRAOCULAR LENS PLACEMENT (IOC) RIGHT 5.51 00:48.8;  Surgeon: Lockie Mola, MD;  Location: [facility];  Service: Ophthalmology;  Laterality: Right;   COLONOSCOPY     COLONOSCOPY WITH PROPOFOL N/A 11/25/2022   Procedure: COLONOSCOPY WITH PROPOFOL;  Surgeon: Toney Reil, MD;  Location: [facility];  Service: Endoscopy;  Laterality: N/A;   POLYPECTOMY  11/25/2022   Procedure: POLYPECTOMY;  Surgeon: Toney Reil, MD;  Location: [facility];  Service: Endoscopy;;   TONSILLECTOMY     TUBAL LIGATION      Social History   Tobacco Use   Smoking status: Every Day    Current packs/day: 0.50    Average packs/day: 0.5 packs/day for 65.3 years (32.6 ttl pk-yrs)    Types: Cigarettes    Start date: 02/17/1958    Smokeless tobacco: Never   Tobacco comments:    aware needs to stop   Vaping Use   Vaping status: Never Used  Substance Use Topics   Alcohol use: Yes    Alcohol/week: 1.0 standard drink of alcohol    Types: 1 Glasses of wine per week   Drug use: No     Medication list has been reviewed and updated.  No outpatient medications have been marked as taking for the 06/01/23 encounter (Office Visit) with Duanne Limerick, MD.       06/01/2023    2:35 PM 05/05/2023   11:23 AM 01/09/2023    8:08 AM 01/02/2023   10:18 AM  GAD 7 : Generalized Anxiety Score  Nervous, Anxious, on Edge 0 0 0 0  Control/stop worrying 0 0 0 0  Worry too much - different things 0 0 0 0  Trouble relaxing 0 0 0 0  Restless 0 0 0 0  Easily annoyed or irritable 0 0 0 0  Afraid - awful might happen 0 0 0 0  Total GAD 7 Score 0 0 0 0  Anxiety Difficulty  Not difficult at all Not difficult at all Not difficult at all       06/01/2023    2:35 PM 05/05/2023   11:22 AM 01/09/2023    8:07 AM  Depression screen PHQ 2/9  Decreased Interest 0 0 0  Down, Depressed, Hopeless 0 0 0  PHQ - 2 Score 0 0 0  Altered sleeping  1 0  Tired, decreased energy  1 0  Change in appetite  0 0  Feeling bad or failure about yourself   0 0  Trouble concentrating  0 0  Moving slowly or fidgety/restless  0 0  Suicidal thoughts  0 0  PHQ-9 Score  2 0  Difficult doing work/chores  Not difficult at all Not difficult at all    BP Readings from Last 3 Encounters:  06/01/23 116/74  05/05/23 (!) 142/90  03/20/23 124/76    Physical Exam Vitals and nursing note reviewed.  Constitutional:      General: She is not in acute distress.    Appearance: She is not diaphoretic.  HENT:     Head: Normocephalic and atraumatic.     Right Ear: External ear normal.     Left Ear: External ear normal.     Nose: Nose normal.  Eyes:     General:        Right eye: No discharge.        Left eye: No discharge.     Conjunctiva/sclera: Conjunctivae  normal.     Pupils: Pupils are equal, round, and reactive to light.  Neck:     Thyroid: No thyromegaly.     Vascular: No JVD.  Cardiovascular:     Rate and Rhythm: Normal rate and regular rhythm.     Heart sounds: Normal heart sounds. No murmur heard.    No friction rub. No gallop.  Pulmonary:     Effort: Pulmonary effort is normal.     Breath sounds: Normal breath sounds. No wheezing, rhonchi or rales.  Chest:     Chest wall: No tenderness.  Abdominal:     General: Bowel sounds are normal.     Palpations: Abdomen is soft. There is no mass.     Tenderness: There is no abdominal tenderness. There is no guarding or rebound.  Musculoskeletal:        General: Normal range of motion.     Cervical back: Normal range of motion and neck supple.  Lymphadenopathy:     Cervical: No cervical adenopathy.  Skin:    General: Skin is warm and dry.  Neurological:     Mental Status: She is alert.     Deep Tendon Reflexes: Reflexes are normal and symmetric.     Wt Readings from Last 3 Encounters:  06/01/23 161 lb 3.2 oz (73.1 kg)  05/05/23 161 lb 2 oz (73.1 kg)  03/20/23 162 lb (73.5 kg)    BP 116/74   Pulse 68   Resp 16   Ht 5\' 7"  (1.702 m)   Wt 161 lb 3.2 oz (73.1 kg)   SpO2 98%   BMI 25.25 kg/m   Assessment and Plan: 1. Venous insufficiency, peripheral (Primary) Chronic.  Uncontrolled.  Stable.  Patient has swelling primarily in the left calf/ankle relative to right.  Patient has been using over-the-counter compression stockings with little or no results.  We will formally refer to vascular surgery for evaluation of venous insufficiency and in the meantime patient has been encouraged to discontinue hydrochlorothiazide and there is no evidence of volume overload to suggest need for diuresis. - Ambulatory referral to Vascular Surgery     Alayne Allis, MD

## 2023-06-03 ENCOUNTER — Ambulatory Visit: Payer: Medicare HMO | Admitting: Emergency Medicine

## 2023-06-03 VITALS — Ht 67.0 in | Wt 160.0 lb

## 2023-06-03 DIAGNOSIS — Z0000 Encounter for general adult medical examination without abnormal findings: Secondary | ICD-10-CM | POA: Diagnosis not present

## 2023-06-03 NOTE — Progress Notes (Signed)
 Subjective:   Caroline Harris is a 78 y.o. who presents for a Medicare Wellness preventive visit.  Visit Complete: Virtual I connected with  [name] on 06/03/23 by a audio enabled telemedicine application and verified that I am speaking with the correct person using two identifiers.  Patient Location: Home  Provider Location: Home Office  I discussed the limitations of evaluation and management by telemedicine. The patient expressed understanding and agreed to proceed.  Vital Signs: Because this visit was a virtual/telehealth visit, some criteria may be missing or patient reported. Any vitals not documented were not able to be obtained and vitals that have been documented are patient reported.  VideoDeclined- This patient declined Librarian, academic. Therefore the visit was completed with audio only.  Persons Participating in Visit: Patient.  AWV Questionnaire: No: Patient Medicare AWV questionnaire was not completed prior to this visit.  Cardiac Risk Factors include: advanced age (>56men, >80 women);dyslipidemia;smoking/ tobacco exposure     Objective:    Today's Vitals   06/03/23 1010  Weight: 160 lb (72.6 kg)  Height: 5\' 7"  (1.702 m)   Body mass index is 25.06 kg/m.     06/03/2023   10:26 AM 11/25/2022    7:49 AM 05/28/2022   10:06 AM 10/23/2021   11:44 AM 10/09/2021   12:49 PM 05/22/2021   10:38 AM 03/29/2021    1:20 PM  Advanced Directives  Does Patient Have a Medical Advance Directive? No No No No No No No  Would patient like information on creating a medical advance directive? No - Patient declined  No - Patient declined No - Patient declined No - Patient declined No - Patient declined No - Patient declined    Current Medications (verified) Outpatient Encounter Medications as of 06/03/2023  Medication Sig   aspirin EC 81 MG tablet Take 81 mg by mouth daily. Swallow whole.   Baclofen 5 MG TABS Take 5 mg by mouth as needed (for headache  rescue).   colestipol (COLESTID) 1 g tablet Take 1 g by mouth daily.   hydrochlorothiazide (HYDRODIURIL) 12.5 MG tablet Take 1 tablet (12.5 mg total) by mouth daily.   loperamide (IMODIUM A-D) 2 MG tablet Take 2 mg by mouth 4 (four) times daily as needed for diarrhea or loose stools.   lovastatin (MEVACOR) 20 MG tablet TAKE (1) TABLET BY MOUTH EVERY DAY   Melatonin 10 MG TABS Take 1 tablet by mouth at bedtime.   MOBIC 7.5 MG tablet    montelukast (SINGULAIR) 10 MG tablet TAKE (1) TABLET BY MOUTH EVERY DAY   Multiple Vitamins-Minerals (CENTRUM SILVER ADULT 50+ PO) Take 1 capsule by mouth daily at 6 (six) AM.   tiZANidine (ZANAFLEX) 2 MG tablet Take 2 mg by mouth at bedtime.   mupirocin ointment (BACTROBAN) 2 % Apply 1 Application topically 2 (two) times daily. (Patient not taking: Reported on 06/03/2023)   No facility-administered encounter medications on file as of 06/03/2023.    Allergies (verified) Patient has no known allergies.   History: Past Medical History:  Diagnosis Date   Aortic atherosclerosis (HCC)    Arthritis    In fingers   CAD (coronary artery disease)    Cataract March, 2023   Will have removed   Colitis    Collagenous colitis    COPD, moderate (HCC) 08/23/2015   Diverticulitis    Diverticulitis 03/29/2021   Febrile seizure (HCC)    as infant   Headache    History of  CVA (cerebrovascular accident)    Hyperlipidemia    Mild intermittent asthma    New persistent daily headache    Osteopenia    Restless leg    Wears dentures    partial lower   Past Surgical History:  Procedure Laterality Date   ABDOMINAL HYSTERECTOMY     CATARACT EXTRACTION W/PHACO Left 10/09/2021   Procedure: CATARACT EXTRACTION PHACO AND INTRAOCULAR LENS PLACEMENT (IOC) LEFT 8.38 01:09.7;  Surgeon: Lockie Mola, MD;  Location: [facility];  Service: Ophthalmology;  Laterality: Left;   CATARACT EXTRACTION W/PHACO Right 10/23/2021   Procedure: CATARACT EXTRACTION PHACO AND  INTRAOCULAR LENS PLACEMENT (IOC) RIGHT 5.51 00:48.8;  Surgeon: Lockie Mola, MD;  Location: [facility];  Service: Ophthalmology;  Laterality: Right;   COLONOSCOPY     COLONOSCOPY WITH PROPOFOL N/A 11/25/2022   Procedure: COLONOSCOPY WITH PROPOFOL;  Surgeon: Toney Reil, MD;  Location: [facility];  Service: Endoscopy;  Laterality: N/A;   POLYPECTOMY  11/25/2022   Procedure: POLYPECTOMY;  Surgeon: Toney Reil, MD;  Location: [facility];  Service: Endoscopy;;   TONSILLECTOMY     TUBAL LIGATION     Family History  Problem Relation Age of Onset   Breast cancer Mother 69   Cancer Mother    Miscarriages / [place] Mother    Heart attack Father    Heart disease Father    Social History   Socioeconomic History   Marital status: Married    Spouse name: Larita Fife   Number of children: 3   Years of education: Not on file   Highest education level: Bachelor's degree (e.g., BA, AB, BS)  Occupational History    Comment: part time music teacher at [employer]   Occupation: retired  Tobacco Use   Smoking status: Every Day    Current packs/day: 0.50    Average packs/day: 0.5 packs/day for 59.3 years (29.6 ttl pk-yrs)    Types: Cigarettes    Start date: 1966    Passive exposure: Never   Smokeless tobacco: Never   Tobacco comments:    aware needs to stop   Vaping Use   Vaping status: Never Used  Substance and Sexual Activity   Alcohol use: Yes    Comment: 1 glass of wine monthly or less   Drug use: No   Sexual activity: Not Currently    Birth control/protection: Abstinence, Surgical    Comment: Hysterectomy  Other Topics Concern   Not on file  Social History Narrative   Not on file   Social Drivers of Health   Financial Resource Strain: Low Risk  (06/03/2023)   Overall Financial Resource Strain (CARDIA)    Difficulty of Paying Living Expenses: Not hard at all  Food Insecurity: No Food Insecurity (06/03/2023)   Hunger Vital  Sign    Worried About Running Out of Food in the Last Year: Never true    Ran Out of Food in the Last Year: Never true  Transportation Needs: No Transportation Needs (06/03/2023)   PRAPARE - Administrator, Civil Service (Medical): No    Lack of Transportation (Non-Medical): No  Physical Activity: Inactive (06/03/2023)   Exercise Vital Sign    Days of Exercise per Week: 0 days    Minutes of Exercise per Session: 0 min  Stress: No Stress Concern Present (06/03/2023)   [employer] of Occupational Health - Occupational Stress Questionnaire    Feeling of Stress : Not at all  Social Connections: Socially Integrated (  06/03/2023)   Social Connection and Isolation Panel [NHANES]    Frequency of Communication with Friends and Family: More than three times a week    Frequency of Social Gatherings with Friends and Family: More than three times a week    Attends Religious Services: More than 4 times per year    Active Member of [employer] or Organizations: Yes    Attends Engineer, structural: More than 4 times per year    Marital Status: Married    Tobacco Counseling Ready to quit: Not Answered Counseling given: Not Answered Tobacco comments: aware needs to stop     Clinical Intake:  Pre-visit preparation completed: Yes  Pain : No/denies pain     BMI - recorded: 25.06 Nutritional Status: BMI 25 -29 Overweight Nutritional Risks: None Diabetes: No  No results found for: "HGBA1C"   How often do you need to have someone help you when you read instructions, pamphlets, or other written materials from your doctor or pharmacy?: 1 - Never  Interpreter Needed?: No  Information entered by :: Tora Kindred, CMA   Activities of Daily Living     06/03/2023   10:12 AM 11/25/2022    7:49 AM  In your present state of health, do you have any difficulty performing the following activities:  Hearing? 0 0  Vision? 0 0  Difficulty concentrating or making decisions? 0 0   Walking or climbing stairs? 0   Dressing or bathing? 0   Doing errands, shopping? 0   Preparing Food and eating ? N   Using the Toilet? N   In the past six months, have you accidently leaked urine? N   Do you have problems with loss of bowel control? N   Managing your Medications? N   Managing your Finances? N   Housekeeping or managing your Housekeeping? N     Patient Care Team: Duanne Limerick, MD as PCP - General (Family Medicine) Dominica Severin, MD as Consulting Physician (Orthopedic Surgery) Galen Manila, MD as Referring Physician (Ophthalmology) Mertie Moores, MD as Referring Physician (Pulmonary Disease)  Indicate any recent Medical Services you may have received from other than [facility] providers in the past year (date may be approximate).     Assessment:   This is a routine wellness examination for [place].  Hearing/Vision screen Hearing Screening - Comments:: Denies hearing loss Vision Screening - Comments:: Gets eye exams, Dr. Druscilla Brownie, [place]    Goals Addressed             This Visit's Progress    Patient Stated       Complete healthcare POA       Depression Screen     06/03/2023   10:23 AM 06/01/2023    2:35 PM 05/05/2023   11:22 AM 01/09/2023    8:07 AM 01/02/2023   10:18 AM 12/12/2022    3:19 PM 09/22/2022    8:33 AM  PHQ 2/9 Scores  PHQ - 2 Score 0 0 0 0 0 0 0  PHQ- 9 Score 1  2 0 0 0 0    Fall Risk     06/03/2023   10:27 AM 06/01/2023    2:34 PM 05/05/2023   11:22 AM 01/09/2023    8:07 AM 01/02/2023   10:18 AM  Fall Risk   Falls in the past year? 0 0 0 0 0  Number falls in past yr: 0 0 0 0 0  Injury with Fall? 0 0 0 0 0  Risk for fall due to : No Fall Risks  No Fall Risks No Fall Risks No Fall Risks  Follow up Falls prevention discussed;Falls evaluation completed  Falls evaluation completed Falls evaluation completed Falls evaluation completed    MEDICARE RISK AT HOME:  Medicare Risk at Home Any stairs in or around the  home?: Yes If so, are there any without handrails?: No Home free of loose throw rugs in walkways, pet beds, electrical cords, etc?: Yes Adequate lighting in your home to reduce risk of falls?: Yes Life alert?: No Use of a cane, walker or w/c?: No Grab bars in the bathroom?: No Shower chair or bench in shower?: No Elevated toilet seat or a handicapped toilet?: Yes  TIMED UP AND GO:  Was the test performed?  No  Cognitive Function: 6CIT completed        06/03/2023   10:28 AM 05/28/2022   10:17 AM 05/11/2019    3:36 PM 04/05/2018   11:56 AM  6CIT Screen  What Year? 0 points 0 points 0 points 0 points  What month? 0 points 0 points 0 points 0 points  What time? 0 points 0 points 0 points 0 points  Count back from 20 0 points 0 points 0 points 0 points  Months in reverse 0 points 0 points 0 points 0 points  Repeat phrase 0 points 0 points 0 points 0 points  Total Score 0 points 0 points 0 points 0 points    Immunizations Immunization History  Administered Date(s) Administered   Fluad Quad(high Dose 65+) 11/05/2018, 11/11/2021   Influenza Inj Mdck Quad Pf 11/20/2016   Influenza, High Dose Seasonal PF 11/25/2017   Influenza,inj,Quad PF,6+ Mos 01/04/2015, 12/13/2015   Influenza-Unspecified 11/25/2017, 11/26/2019, 11/18/2020   Moderna Covid-19 Vaccine Bivalent Booster 78yrs & up 05/02/2021   Moderna Sars-Covid-2 Vaccination 03/18/2019, 04/15/2019, 12/19/2019, 01/20/2021, 11/21/2021   Pneumococcal Conjugate-13 08/13/2015   Pneumococcal Polysaccharide-23 03/21/2011   Tdap 03/21/2011   Zoster Recombinant(Shingrix) 12/02/2017, 02/02/2018   Zoster, Live 02/18/2015    Screening Tests Health Maintenance  Topic Date Due   COVID-19 Vaccine (7 - 2024-25 season) 10/19/2022   Lung Cancer Screening  06/10/2023   INFLUENZA VACCINE  09/18/2023   Colonoscopy  11/25/2023   MAMMOGRAM  03/30/2024   Medicare Annual Wellness (AWV)  06/02/2024   DEXA SCAN  06/26/2025   Pneumonia Vaccine 42+  Years old  Completed   Hepatitis C Screening  Completed   Zoster Vaccines- Shingrix  Completed   HPV VACCINES  Aged Out   Meningococcal B Vaccine  Aged Out   DTaP/Tdap/Td  Discontinued    Health Maintenance  Health Maintenance Due  Topic Date Due   COVID-19 Vaccine (7 - 2024-25 season) 10/19/2022   Lung Cancer Screening  06/10/2023   Health Maintenance Items Addressed: See Nurse Notes  Additional Screening:  Vision Screening: Recommended annual ophthalmology exams for early detection of glaucoma and other disorders of the eye.  Dental Screening: Recommended annual dental exams for proper oral hygiene  Community Resource Referral / Chronic Care Management: CRR required this visit?  No   CCM required this visit?  No     Plan:     I have personally reviewed and noted the following in the patient's chart:   Medical and social history Use of alcohol, tobacco or illicit drugs  Current medications and supplements including opioid prescriptions. Patient is not currently taking opioid prescriptions. Functional ability and status Nutritional status Physical activity Advanced directives List of other physicians  Hospitalizations, surgeries, and ER visits in previous 12 months Vitals Screenings to include cognitive, depression, and falls Referrals and appointments  In addition, I have reviewed and discussed with patient certain preventive protocols, quality metrics, and best practice recommendations. A written personalized care plan for preventive services as well as general preventive health recommendations were provided to patient.     Jaunita Messier, CMA   06/03/2023   After Visit Summary: (MyChart) Due to this being a telephonic visit, the after visit summary with patients personalized plan was offered to patient via MyChart   Notes:  Due for colonoscopy 11/2023 (has appointment with GI on 09/07/23) LDCT follow by Dr. Arno Lapidus Needs Tdap vaccine

## 2023-06-03 NOTE — Patient Instructions (Addendum)
 Caroline Harris , Thank you for taking time to come for your Medicare Wellness Visit. I appreciate your ongoing commitment to your health goals. Please review the following plan we discussed and let me know if I can assist you in the future.   Referrals/Orders/Follow-Ups/Clinician Recommendations: Get a tetanus shot at your convenience. Call Dr. Kirsten Perches office to get scheduled for a low dose lung CT scan that is due 06/10/23. Complete the healthcare power of attorney forms.  This is a list of the screening recommended for you and due dates:  Health Maintenance  Topic Date Due   COVID-19 Vaccine (7 - 2024-25 season) 10/19/2022   Screening for Lung Cancer  06/10/2023   Flu Shot  09/18/2023   Colon Cancer Screening  11/25/2023   Mammogram  03/30/2024   Medicare Annual Wellness Visit  06/02/2024   DEXA scan (bone density measurement)  06/26/2025   Pneumonia Vaccine  Completed   Hepatitis C Screening  Completed   Zoster (Shingles) Vaccine  Completed   HPV Vaccine  Aged Out   Meningitis B Vaccine  Aged Out   DTaP/Tdap/Td vaccine  Discontinued    Advanced directives: (Copy Requested) Please bring a copy of your health care power of attorney and living will to the office to be added to your chart at your convenience. You can mail to [facility] [address] [place], [place] [address] or email to [email or web address]  Next Medicare Annual Wellness Visit scheduled for next year: Yes, 06/08/24 @ 10:10am (phone visit)

## 2023-06-09 ENCOUNTER — Ambulatory Visit (INDEPENDENT_AMBULATORY_CARE_PROVIDER_SITE_OTHER): Admitting: Vascular Surgery

## 2023-06-09 ENCOUNTER — Encounter (INDEPENDENT_AMBULATORY_CARE_PROVIDER_SITE_OTHER): Payer: Self-pay | Admitting: Vascular Surgery

## 2023-06-09 VITALS — BP 152/83 | HR 57 | Resp 18 | Ht 67.0 in | Wt 160.4 lb

## 2023-06-09 DIAGNOSIS — I872 Venous insufficiency (chronic) (peripheral): Secondary | ICD-10-CM | POA: Diagnosis not present

## 2023-06-09 DIAGNOSIS — J449 Chronic obstructive pulmonary disease, unspecified: Secondary | ICD-10-CM | POA: Diagnosis not present

## 2023-06-09 DIAGNOSIS — E785 Hyperlipidemia, unspecified: Secondary | ICD-10-CM | POA: Diagnosis not present

## 2023-06-09 NOTE — Progress Notes (Signed)
 Subjective:    Patient ID: Caroline Harris, female    DOB: 22-Oct-1945, 78 y.o.   MRN: [id number] Chief Complaint  Patient presents with   New Patient (Initial Visit)    np. consult. bilat lower extremety swelling with L>R  ref: Kurt Phi C. Humbarger is a 78 year old female who is referred to us  for bilateral lower extremity swelling left greater than right.  She endorses this has been going on for a while but has become something more chronic since January of this year 2025.  She noticed that her left ankle started to swell and then her right ankle followed with an obvious change in color to both her feet when she is dependent.  She does state that it does get a little better after sleeping at night.  First thing in the morning it does not seem to bother her as much but as the day goes along it gets worse.  She denies any sharp shooting pains or numbness and tingling.  She denies any pain just sitting at rest or while exercising/ambulating.  She does endorse having some restless leg syndrome but states that this is intermittent.  She also endorses being placed on hydrochlorothiazide  for 2 weeks but does not think it did much good if any at all.  He was referred to vascular surgery for evaluation.    Review of Systems  Constitutional: Negative.   Respiratory: Negative.    Cardiovascular: Negative.   Gastrointestinal: Negative.   Endocrine: Negative.   Genitourinary: Negative.   Musculoskeletal: Negative.   Skin: Negative.   Neurological: Negative.   All other systems reviewed and are negative.      Objective:   Physical Exam Vitals reviewed.  Constitutional:      Appearance: Normal appearance. She is normal weight.  HENT:     Head: Normocephalic.  Eyes:     Pupils: Pupils are equal, round, and reactive to light.  Cardiovascular:     Rate and Rhythm: Normal rate and regular rhythm.     Pulses: Normal pulses.     Heart sounds: Normal heart sounds.  Pulmonary:      Effort: Pulmonary effort is normal.     Breath sounds: Normal breath sounds.  Abdominal:     General: Bowel sounds are normal.  Musculoskeletal:        General: Swelling present.     Right lower leg: Edema present.     Left lower leg: Edema present.  Skin:    General: Skin is warm and dry.  Neurological:     General: No focal deficit present.     Mental Status: She is alert and oriented to person, place, and time.  Psychiatric:        Mood and Affect: Mood normal.        Behavior: Behavior normal.        Thought Content: Thought content normal.        Judgment: Judgment normal.     BP (!) 152/83   Pulse (!) 57   Resp 18   Ht 5\' 7"  (1.702 m)   Wt 160 lb 6.4 oz (72.8 kg)   BMI 25.12 kg/m   Past Medical History:  Diagnosis Date   Aortic atherosclerosis (HCC)    Arthritis    In fingers   CAD (coronary artery disease)    Cataract March, 2023   Will have removed   Colitis    Collagenous colitis    COPD, moderate (HCC)  08/23/2015   Diverticulitis    Diverticulitis 03/29/2021   Febrile seizure (HCC)    as infant   Headache    History of CVA (cerebrovascular accident)    Hyperlipidemia    Mild intermittent asthma    New persistent daily headache    Osteopenia    Restless leg    Wears dentures    partial lower    Social History   Socioeconomic History   Marital status: Married    Spouse name: Tanis Fan   Number of children: 3   Years of education: Not on file   Highest education level: Bachelor's degree (e.g., BA, AB, BS)  Occupational History    Comment: part time music teacher at [employer]   Occupation: retired  Tobacco Use   Smoking status: Every Day    Current packs/day: 0.50    Average packs/day: 0.5 packs/day for 59.3 years (29.7 ttl pk-yrs)    Types: Cigarettes    Start date: 1966    Passive exposure: Never   Smokeless tobacco: Never   Tobacco comments:    aware needs to stop   Vaping Use   Vaping status: Never Used  Substance and  Sexual Activity   Alcohol use: Yes    Comment: 1 glass of wine monthly or less   Drug use: No   Sexual activity: Not Currently    Birth control/protection: Abstinence, Surgical    Comment: Hysterectomy  Other Topics Concern   Not on file  Social History Narrative   Not on file   Social Drivers of Health   Financial Resource Strain: Low Risk  (06/03/2023)   Overall Financial Resource Strain (CARDIA)    Difficulty of Paying Living Expenses: Not hard at all  Food Insecurity: No Food Insecurity (06/03/2023)   Hunger Vital Sign    Worried About Running Out of Food in the Last Year: Never true    Ran Out of Food in the Last Year: Never true  Transportation Needs: No Transportation Needs (06/03/2023)   PRAPARE - Administrator, Civil Service (Medical): No    Lack of Transportation (Non-Medical): No  Physical Activity: Inactive (06/03/2023)   Exercise Vital Sign    Days of Exercise per Week: 0 days    Minutes of Exercise per Session: 0 min  Stress: No Stress Concern Present (06/03/2023)   [employer] of Occupational Health - Occupational Stress Questionnaire    Feeling of Stress : Not at all  Social Connections: Socially Integrated (06/03/2023)   Social Connection and Isolation Panel [NHANES]    Frequency of Communication with Friends and Family: More than three times a week    Frequency of Social Gatherings with Friends and Family: More than three times a week    Attends Religious Services: More than 4 times per year    Active Member of [employer] or Organizations: Yes    Attends Banker Meetings: More than 4 times per year    Marital Status: Married  Catering manager Violence: Not At Risk (06/03/2023)   Humiliation, Afraid, Rape, and Kick questionnaire    Fear of Current or Ex-Partner: No    Emotionally Abused: No    Physically Abused: No    Sexually Abused: No    Past Surgical History:  Procedure Laterality Date   ABDOMINAL HYSTERECTOMY     CATARACT  EXTRACTION W/PHACO Left 10/09/2021   Procedure: CATARACT EXTRACTION PHACO AND INTRAOCULAR LENS PLACEMENT (IOC) LEFT 8.38 01:09.7;  Surgeon: Annell Kidney, MD;  Location: [facility];  Service: Ophthalmology;  Laterality: Left;   CATARACT EXTRACTION W/PHACO Right 10/23/2021   Procedure: CATARACT EXTRACTION PHACO AND INTRAOCULAR LENS PLACEMENT (IOC) RIGHT 5.51 00:48.8;  Surgeon: Annell Kidney, MD;  Location: [facility];  Service: Ophthalmology;  Laterality: Right;   COLONOSCOPY     COLONOSCOPY WITH PROPOFOL  N/A 11/25/2022   Procedure: COLONOSCOPY WITH PROPOFOL ;  Surgeon: Selena Daily, MD;  Location: [facility];  Service: Endoscopy;  Laterality: N/A;   POLYPECTOMY  11/25/2022   Procedure: POLYPECTOMY;  Surgeon: Selena Daily, MD;  Location: [facility];  Service: Endoscopy;;   TONSILLECTOMY     TUBAL LIGATION      Family History  Problem Relation Age of Onset   Breast cancer Mother 32   Cancer Mother    Miscarriages / Stillbirths Mother    Heart attack Father    Heart disease Father     No Known Allergies     Latest Ref Rng & Units 03/23/2023    8:36 AM 09/25/2021   10:12 AM 03/31/2021    3:58 AM  CBC  WBC 3.4 - 10.8 x10E3/uL 4.5  8.0  5.3   Hemoglobin 11.1 - 15.9 g/dL 16.1  09.6  04.5   Hematocrit 34.0 - 46.6 % 38.1  39.1  33.8   Platelets 150 - 450 x10E3/uL 312  289  231       CMP     Component Value Date/Time   NA 141 03/23/2023 0836   K 4.5 03/23/2023 0836   CL 102 03/23/2023 0836   CO2 24 03/23/2023 0836   GLUCOSE 86 03/23/2023 0836   GLUCOSE 102 (H) 03/31/2021 0358   BUN 14 03/23/2023 0836   CREATININE 0.64 03/23/2023 0836   CALCIUM  9.4 03/23/2023 0836   PROT 5.9 (L) 03/23/2023 0836   ALBUMIN 3.8 03/23/2023 0836   AST 22 03/23/2023 0836   ALT 13 03/23/2023 0836   ALKPHOS 47 03/23/2023 0836   BILITOT 0.2 03/23/2023 0836   EGFR 90 03/23/2023 0836   GFRNONAA >60 03/31/2021 0358     No results found.      Assessment & Plan:   1. Venous (peripheral) insufficiency (Primary) No surgery or intervention at this point in time.   The patient is CEAP [id number]   I have discussed with the patient venous insufficiency and why it  causes symptoms. I have discussed with the patient the chronic skin changes that accompany venous insufficiency and the long term sequela such as infection and ulceration.  Patient will begin wearing graduated compression stockings or compression wraps on a daily basis.  The patient will put the compression on first thing in the morning and removing them in the evening. The patient is instructed specifically not to sleep in the compression.    In addition, behavioral modification including several periods of elevation of the lower extremities during the day will be continued. I have demonstrated that proper elevation is a position with the ankles at heart level.  The patient is instructed to begin routine exercise, especially walking on a daily basis  Patient should undergo duplex ultrasound of the venous system to ensure that DVT or reflux is not present.  Following the review of the ultrasound the patient will follow up in 2-3 months to reassess the degree of swelling and the control that graduated compression stockings or compression wraps  is offering.   At that time the patient can be assessed for a Lymph Pump depending on  the effectiveness of conservative therapy and the control of the associated lymphedema. - VAS US  LOWER EXTREMITY VENOUS REFLUX; Future  2. Hyperlipidemia, unspecified hyperlipidemia type Continue statin as ordered and reviewed, no changes at this time  3. COPD, moderate (HCC) Continue statin as ordered and reviewed, no changes at this time   Current Outpatient Medications on File Prior to Visit  Medication Sig Dispense Refill   aspirin EC 81 MG tablet Take 81 mg by mouth daily. Swallow whole.     Baclofen 5 MG TABS Take 5 mg by mouth as needed (for  headache rescue).     colestipol (COLESTID) 1 g tablet Take 1 g by mouth daily.     hydrochlorothiazide  (HYDRODIURIL ) 12.5 MG tablet Take 1 tablet (12.5 mg total) by mouth daily. 90 tablet 3   lovastatin  (MEVACOR ) 20 MG tablet TAKE (1) TABLET BY MOUTH EVERY DAY 90 tablet 1   Melatonin 10 MG TABS Take 1 tablet by mouth at bedtime.     MOBIC 7.5 MG tablet      montelukast  (SINGULAIR ) 10 MG tablet TAKE (1) TABLET BY MOUTH EVERY DAY 90 tablet 3   Multiple Vitamins-Minerals (CENTRUM SILVER ADULT 50+ PO) Take 1 capsule by mouth daily at 6 (six) AM.     tiZANidine (ZANAFLEX) 2 MG tablet Take 2 mg by mouth at bedtime.     loperamide (IMODIUM A-D) 2 MG tablet Take 2 mg by mouth 4 (four) times daily as needed for diarrhea or loose stools.     mupirocin  ointment (BACTROBAN ) 2 % Apply 1 Application topically 2 (two) times daily. (Patient not taking: Reported on 06/03/2023) 22 g 0   No current facility-administered medications on file prior to visit.    There are no Patient Instructions on file for this visit. No follow-ups on file.   Annamaria Barrette, NP

## 2023-06-11 ENCOUNTER — Encounter: Payer: Self-pay | Admitting: Radiology

## 2023-06-16 ENCOUNTER — Ambulatory Visit: Admitting: Family Medicine

## 2023-06-24 ENCOUNTER — Other Ambulatory Visit: Payer: Self-pay | Admitting: Specialist

## 2023-06-24 DIAGNOSIS — J449 Chronic obstructive pulmonary disease, unspecified: Secondary | ICD-10-CM

## 2023-06-24 DIAGNOSIS — F17210 Nicotine dependence, cigarettes, uncomplicated: Secondary | ICD-10-CM

## 2023-06-25 IMAGING — MR MR HEAD W/O CM
12 series · 43 of 48 positions shown · non-contrast
Comparison: Head CT 10/07/2014

CLINICAL DATA: Sudden onset of a severe headache 3 weeks ago which
lasted 4 days. Similar episode 6 years ago. Family history of
aneurysm.

EXAM:
MRI HEAD WITHOUT CONTRAST
MRA HEAD WITHOUT CONTRAST
TECHNIQUE: Multiplanar, multi-echo pulse sequences of the brain and surrounding
structures were acquired without intravenous contrast. Angiographic
images of the Circle of Willis were acquired using MRA technique
without intravenous contrast.

[Series 5: ax dwi_tracew · axial · 3.0mm · 0.65mm/px · z∈[-42,+111]mm · 4 of 48 slices shown]
[im 1/48]
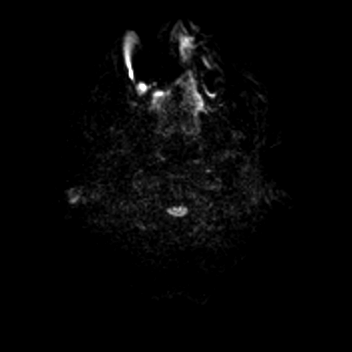
[im 16/48]
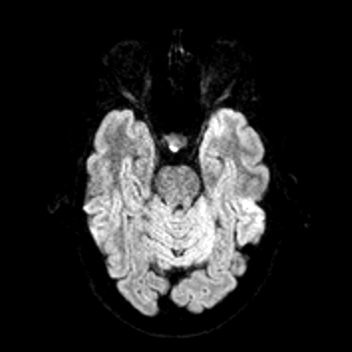
[im 32/48]
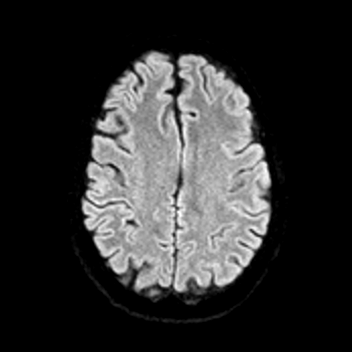
[im 48/48]
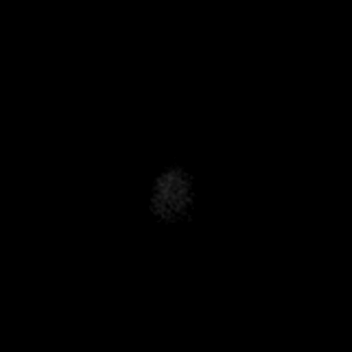

[Series 6: ax dwi_adc · axial · 3.0mm · 0.65mm/px · z∈[-42,+108]mm · 3 of 47 slices shown]
[im 1/47]
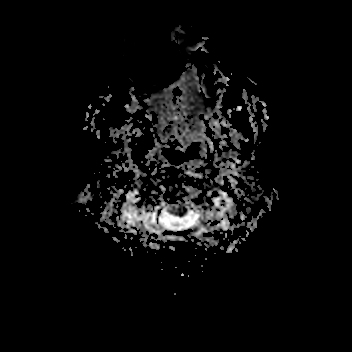
[im 24/47]
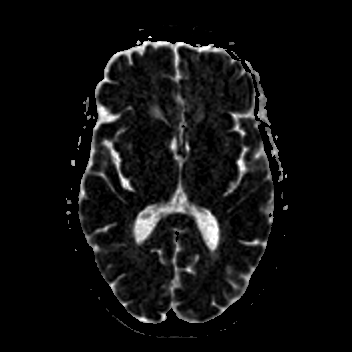
[im 47/47]
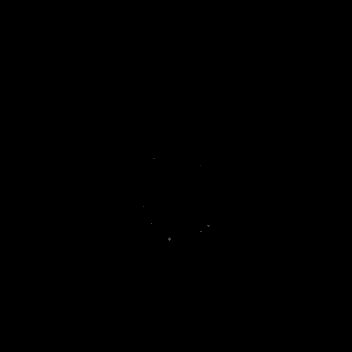

[Series 7: cor dwi_tracew · coronal · 5.0mm · 0.60mm/px · 3 of 40 slices shown]
[im 1/40]
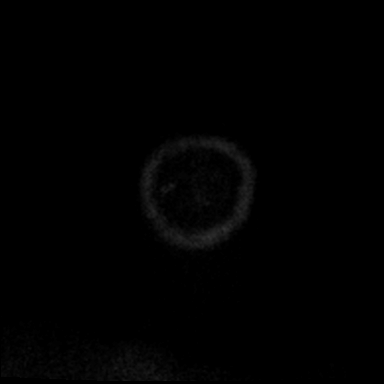
[im 20/40]
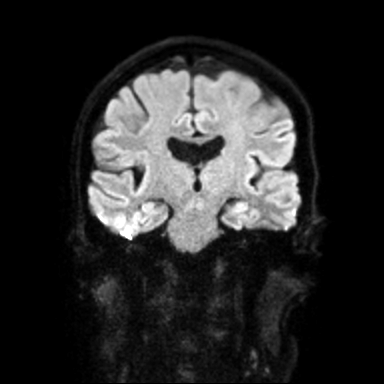
[im 40/40]
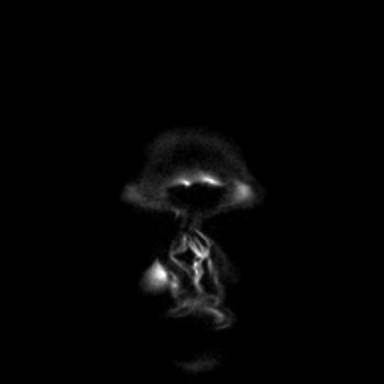

[Series 8: cor dwi_adc · coronal · 5.0mm · 0.60mm/px · 3 of 39 slices shown]
[im 1/39]
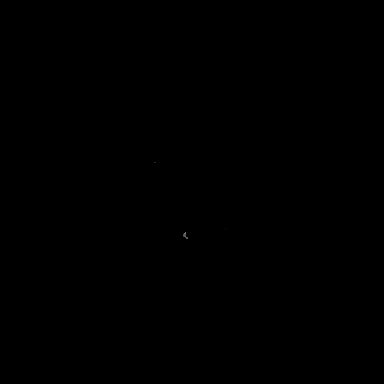
[im 20/39]
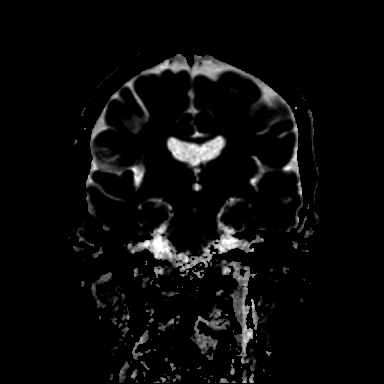
[im 39/39]
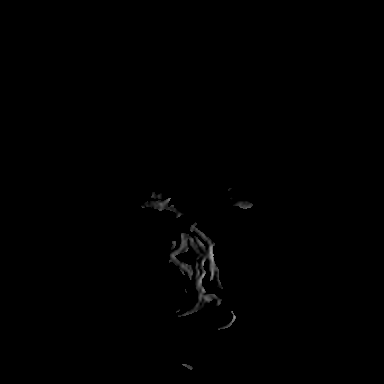

[Series 9: T1 · sagittal · 5.0mm · 0.62mm/px · 2 of 23 slices shown (1 of 2)]
[im 1/23]
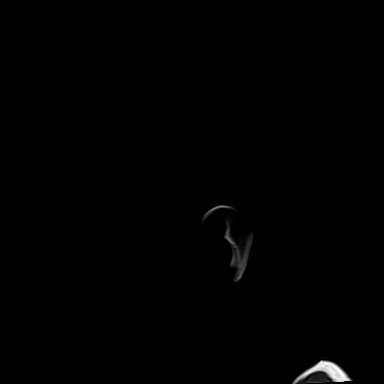
[im 23/23]
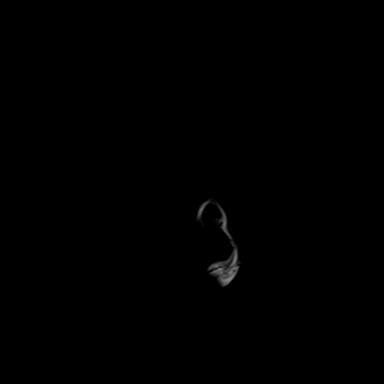

[Series 10: T2 · axial · 5.0mm · 0.53mm/px · z∈[-40,+108]mm · 2 of 26 slices shown (1 of 2)]
[im 1/26]
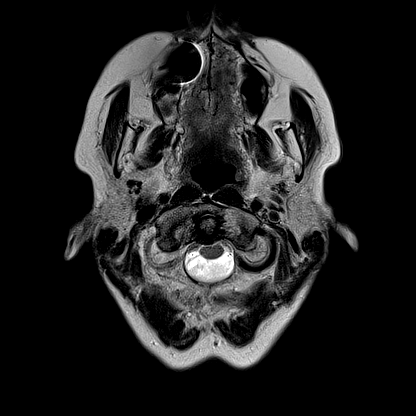
[im 26/26]
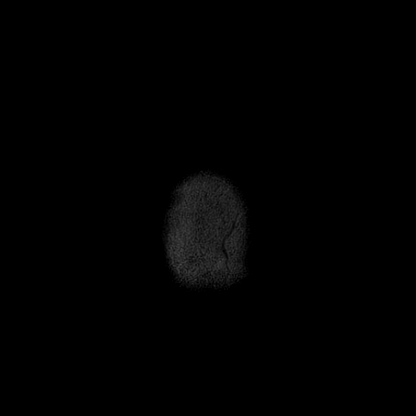

[Series 11: mag_images · axial · 3.0mm · 0.90mm/px · z∈[-52,+123]mm · 4 of 60 slices shown]
[im 1/60]
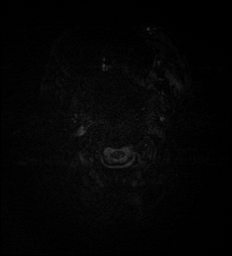
[im 20/60]
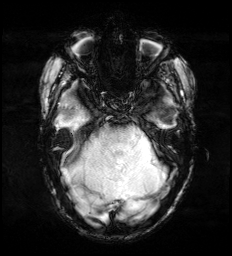
[im 40/60]
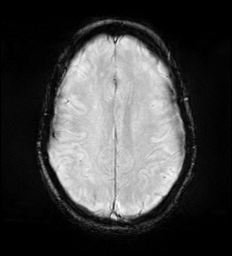
[im 60/60]
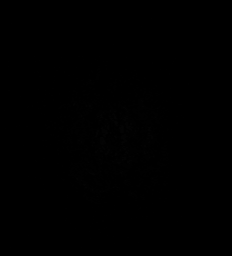

[Series 12: pha_images · axial · 3.0mm · 0.90mm/px · z∈[-52,+120]mm · 4 of 59 slices shown]
[im 1/59]
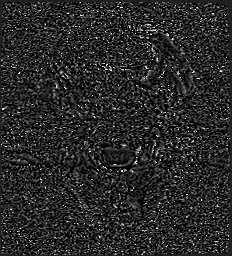
[im 20/59]
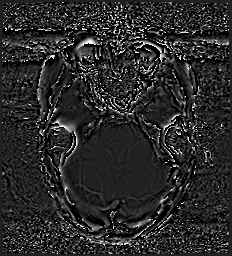
[im 39/59]
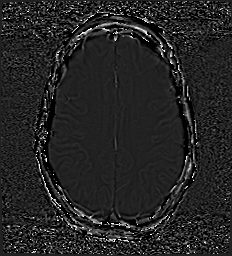
[im 59/59]
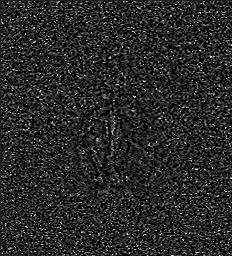

[Series 13: swi_images · axial · 3.0mm · 0.90mm/px · z∈[-52,+123]mm · 4 of 60 slices shown]
[im 1/60]
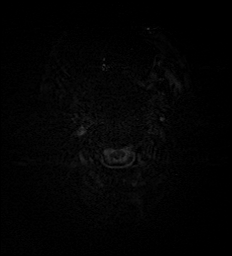
[im 20/60]
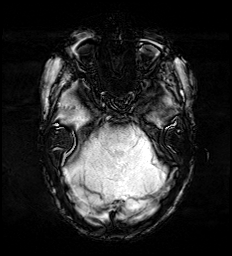
[im 40/60]
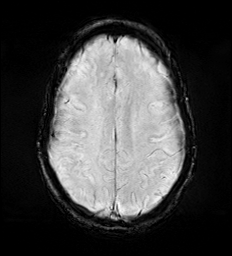
[im 60/60]
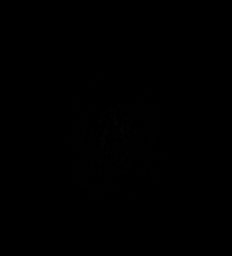

[Series 15: FLAIR · axial · 3.0mm · 0.53mm/px · z∈[-46,+114]mm · 4 of 55 slices shown]
[im 1/55]
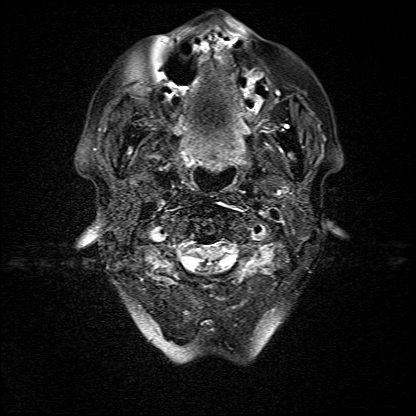
[im 19/55]
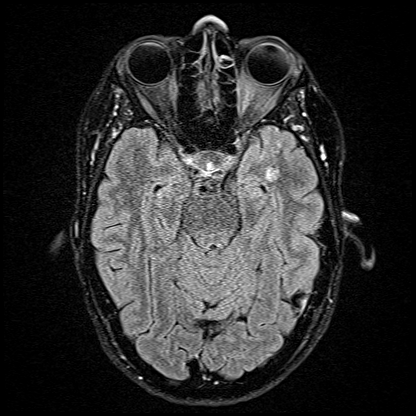
[im 37/55]
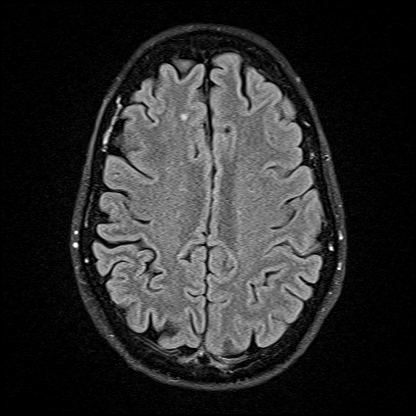
[im 55/55]
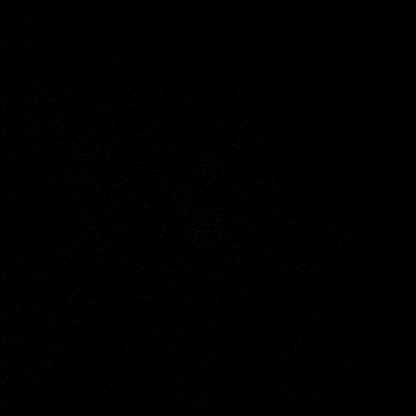

[Series 16: T1 · axial · 1.0mm · 0.98mm/px · z∈[-51,+122]mm · 8 of 175 slices shown (2 of 2)]
[im 1/175]
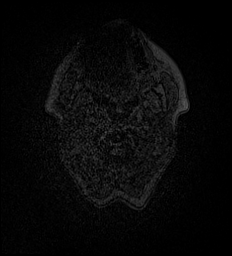
[im 30/175]
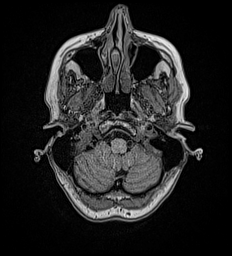
[im 59/175]
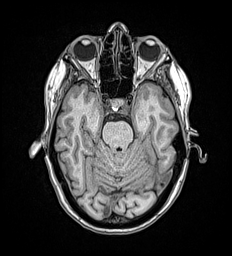
[im 73/175]
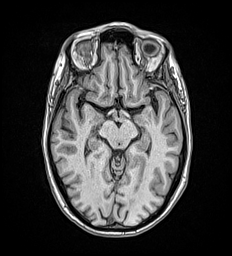
[im 102/175]
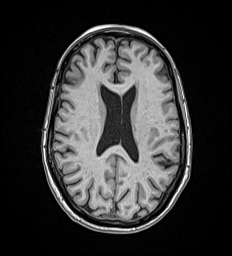
[im 117/175]
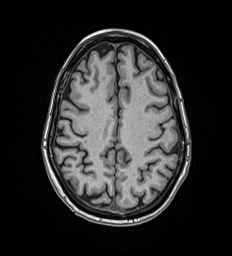
[im 146/175]
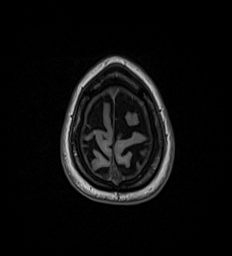
[im 175/175]
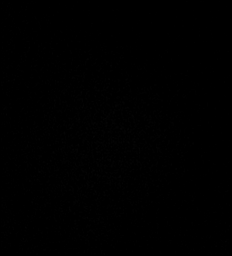

[Series 17: T2 · coronal · 5.0mm · 0.57mm/px · 2 of 32 slices shown (2 of 2)]
[im 1/32]
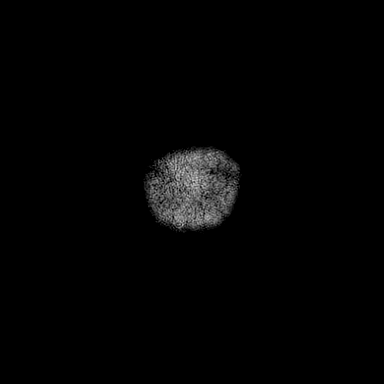
[im 32/32]
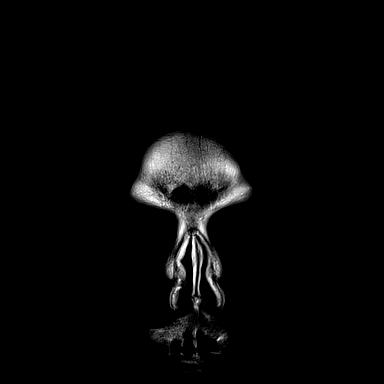

[43 of 48 positions shown; findings below may reference images not displayed]

FINDINGS: MRI HEAD FINDINGS

Brain: There is no evidence of an acute infarct, intracranial
hemorrhage, mass, midline shift, or extra-axial fluid collection.
The ventricles are normal in size. Small T2 hyperintensities in the
cerebral white matter bilaterally are nonspecific but compatible
with mild chronic small vessel ischemic disease. There is a small
focus of cortical and subcortical encephalomalacia with surrounding
gliosis in the anterior aspect of the left temporal stem consistent
with a chronic infarct or other remote insult.

Vascular: Major intracranial vascular flow voids are preserved.

Skull and upper cervical spine: Unremarkable bone marrow signal.

Sinuses/Orbits: Unremarkable orbits. Unremarkable orbits. Paranasal
sinuses and mastoid air cells are clear.

Other: None.

MRA HEAD FINDINGS

Anterior circulation: The internal carotid arteries are widely
patent from skull base to carotid termini. ACAs and MCAs are patent
without evidence of a proximal branch occlusion or significant
proximal stenosis. No aneurysm is identified.

Posterior circulation: The distal left vertebral artery is widely
patent and strongly dominant. The right vertebral artery is
hypoplastic and poorly visualized distal to the PICA origin. Patent
right PICA, left AICA, and bilateral SCA origins are identified. The
basilar artery is widely patent. There is a large left posterior
communicating artery with hypoplastic left P1 segment. Both PCAs are
patent without evidence of a significant proximal stenosis. No
aneurysm is identified.

Anatomic variants: Fetal left PCA.
IMPRESSION: 1. No acute intracranial abnormality.
2. Mild chronic small vessel ischemic disease.
3. Small remote insult in the left temporal lobe.
4. Negative head MRA.

## 2023-06-29 ENCOUNTER — Encounter (INDEPENDENT_AMBULATORY_CARE_PROVIDER_SITE_OTHER): Admitting: Vascular Surgery

## 2023-06-30 ENCOUNTER — Encounter: Payer: Self-pay | Admitting: Specialist

## 2023-07-02 ENCOUNTER — Ambulatory Visit
Admission: RE | Admit: 2023-07-02 | Discharge: 2023-07-02 | Disposition: A | Discharge status: Home / Self Care | Source: Ambulatory Visit | Attending: Specialist | Admitting: Specialist

## 2023-07-02 DIAGNOSIS — F17210 Nicotine dependence, cigarettes, uncomplicated: Secondary | ICD-10-CM

## 2023-07-02 DIAGNOSIS — J449 Chronic obstructive pulmonary disease, unspecified: Secondary | ICD-10-CM

## 2023-07-06 ENCOUNTER — Ambulatory Visit (INDEPENDENT_AMBULATORY_CARE_PROVIDER_SITE_OTHER): Admitting: Family Medicine

## 2023-07-06 ENCOUNTER — Encounter: Payer: Self-pay | Admitting: Family Medicine

## 2023-07-06 VITALS — BP 122/78 | HR 67 | Ht 67.0 in | Wt 159.2 lb

## 2023-07-06 DIAGNOSIS — F5101 Primary insomnia: Secondary | ICD-10-CM | POA: Diagnosis not present

## 2023-07-06 DIAGNOSIS — R5383 Other fatigue: Secondary | ICD-10-CM

## 2023-07-06 DIAGNOSIS — M545 Low back pain, unspecified: Secondary | ICD-10-CM

## 2023-07-06 DIAGNOSIS — R350 Frequency of micturition: Secondary | ICD-10-CM

## 2023-07-06 LAB — POCT URINALYSIS DIPSTICK
Bilirubin, UA: NEGATIVE
Blood, UA: NEGATIVE
Glucose, UA: NEGATIVE
Ketones, UA: NEGATIVE
Leukocytes, UA: NEGATIVE
Nitrite, UA: NEGATIVE
Protein, UA: NEGATIVE
Spec Grav, UA: 1.01 (ref 1.010–1.025)
Urobilinogen, UA: 0.2 U/dL
pH, UA: 6 (ref 5.0–8.0)

## 2023-07-06 MED ORDER — TRAZODONE HCL 50 MG PO TABS: 25.0000 mg | ORAL_TABLET | Freq: Every evening | ORAL | 3 refills | Status: AC | PRN

## 2023-07-06 NOTE — Patient Instructions (Signed)
Seborrheic Dermatitis, Adult Seborrheic dermatitis is a skin disease that causes red, scaly patches. It often occurs on the scalp, where it may be called dandruff. The patches may also appear on other parts of the body. Skin patches tend to occur where there are a lot of oil glands in the skin. Areas of the body that may be affected include: The scalp. The face, eyebrows, and ears. The area around a beard. Skin folds of the body. This includes the armpits, groin, and buttocks. The chest. The condition is often long-lasting (chronic). It may come and go for no known reason. It may be activated by a trigger, such as: Cold weather. Being out in the sun. Stress. Drinking alcohol. What are the causes? The cause of this condition is not known. It may be related to having too much yeast on the skin or changes in how your body's disease-fighting system (immune system) works. What increases the risk? You may be more likely to develop this condition if: You have a weak immune system. You are 79 years old or older. You have other conditions, such as: Human immunodeficiency virus (HIV) or acquired immunodeficiency virus (AIDS). Parkinson's disease. Mood disorders, such as depression. Liver problems. Obesity. What are the signs or symptoms? Symptoms of this condition include: Thick scales on the scalp. Redness on the face or in the armpits. Skin that is flaky. The flakes may be white or yellow. Skin that seems oily or dry but is not helped with moisturizers. Itching or burning in the affected areas. How is this diagnosed? This condition is diagnosed with a medical history and physical exam. A sample of your skin may be tested (skin biopsy). You may need to see a skin specialist (dermatologist). How is this treated? There is no cure for this condition, but treatment can help to manage the symptoms. You may get treatment to remove scales, lower the risk of skin infection, and reduce swelling or  itching. Treatment may include: Medicated shampoos, moisturizing creams, or ointments. Creams that reduce skin yeast. Creams that reduce swelling and irritation (steroids). Follow these instructions at home: Skin care Use any medicated shampoo, skin creams, or ointments only as told by your health care provider. Do not use skin products that contain alcohol. Take lukewarm baths or showers. Avoid very hot water. When you are outside, wear a hat and clothes that block UV light. General instructions Apply over-the-counter and prescription medicines only as told by your health care provider. Learn what triggers your symptoms so you can avoid these things. Use techniques for stress reduction, such as meditation or yoga. Do not drink alcohol if your health care provider tells you not to drink. Keep all follow-up visits. Your health care provider will check your skin to make sure the treatments are helping. Where to find more information American Academy of Dermatology: [employer] Contact a health care provider if: Your symptoms do not get better with treatment. Your symptoms get worse. You have new symptoms. Get help right away if: Your condition quickly gets worse, even with treatment. This information is not intended to replace advice given to you by your health care provider. Make sure you discuss any questions you have with your health care provider. Document Revised: 07/05/2021 Document Reviewed: 07/05/2021 Elsevier Patient Education  2024 [employer].

## 2023-07-06 NOTE — Progress Notes (Signed)
 Date:  07/06/2023   Name:  Caroline Harris   DOB:  [birth year]   MRN:  [id number]   Chief Complaint: Fatigue, Flank Pain, and Urinary Frequency  Flank Pain This is a new problem. The current episode started in the past 7 days. The problem occurs intermittently. The problem has been gradually improving since onset. The pain is present in the gluteal. The quality of the pain is described as aching. The pain does not radiate. The pain is moderate. The pain is Worse during the day. The symptoms are aggravated by bending. Stiffness is present In the morning. Associated symptoms include headaches. Pertinent negatives include no abdominal pain, bladder incontinence, bowel incontinence, chest pain, dysuria, fever, leg pain, pelvic pain, weakness or weight loss.  Urinary Frequency  This is a new problem. The current episode started in the past 7 days. The problem occurs every urination. The quality of the pain is described as burning. The pain is moderate. There has been no fever. Associated symptoms include flank pain and frequency. Pertinent negatives include no chills, discharge, hematuria or urgency. She has tried nothing for the symptoms.  Insomnia Primary symptoms: fragmented sleep, no sleep disturbance, difficulty falling asleep, no somnolence, frequent awakening, no premature morning awakening, no malaise/fatigue, no napping.   The current episode started more than one year. The onset quality is gradual. The problem occurs intermittently. The problem has been waxing and waning since onset.    Lab Results  Component Value Date   NA 141 03/23/2023   K 4.5 03/23/2023   CO2 24 03/23/2023   GLUCOSE 86 03/23/2023   BUN 14 03/23/2023   CREATININE 0.64 03/23/2023   CALCIUM  9.4 03/23/2023   EGFR 90 03/23/2023   GFRNONAA >60 03/31/2021   Lab Results  Component Value Date   CHOL 160 03/23/2023   HDL 61 03/23/2023   LDLCALC 82 03/23/2023   TRIG 94 03/23/2023   CHOLHDL 2.6 03/23/2023   Lab  Results  Component Value Date   TSH 1.120 06/01/2017   No results found for: "HGBA1C" Lab Results  Component Value Date   WBC 4.5 03/23/2023   HGB 12.7 03/23/2023   HCT 38.1 03/23/2023   MCV 94 03/23/2023   PLT 312 03/23/2023   Lab Results  Component Value Date   ALT 13 03/23/2023   AST 22 03/23/2023   ALKPHOS 47 03/23/2023   BILITOT 0.2 03/23/2023   No results found for: "25OHVITD2", "25OHVITD3", "VD25OH"   Review of Systems  Constitutional:  Negative for chills, fever, malaise/fatigue and weight loss.  HENT:  Negative for congestion.   Eyes:  Negative for visual disturbance.  Respiratory:  Negative for choking, chest tightness, shortness of breath, wheezing and stridor.   Cardiovascular:  Negative for chest pain and palpitations.  Gastrointestinal:  Negative for abdominal pain, bowel incontinence, constipation and diarrhea.  Genitourinary:  Positive for flank pain and frequency. Negative for bladder incontinence, dysuria, hematuria, pelvic pain and urgency.  Neurological:  Positive for headaches. Negative for weakness.  Psychiatric/Behavioral:  Negative for sleep disturbance. The patient has insomnia.     Patient Active Problem List   Diagnosis Date Noted   Lumbar back pain 03/20/2023   History of colonic polyps 11/25/2022   Adenomatous polyp of cecum 11/25/2022   Rectal polyp 11/25/2022   Other synovitis and tenosynovitis, unspecified hand 03/13/2021   Inflammatory pain 03/13/2021   Ganglion cyst of finger of right hand 06/25/2020   Pain in finger of right hand 06/25/2020  Aortic atherosclerosis (HCC) 11/05/2018   COPD, moderate (HCC) 08/23/2015   Asthma, mild intermittent 06/07/2015   Hyperlipidemia 06/07/2015    No Known Allergies  Past Surgical History:  Procedure Laterality Date   ABDOMINAL HYSTERECTOMY     CATARACT EXTRACTION W/PHACO Left 10/09/2021   Procedure: CATARACT EXTRACTION PHACO AND INTRAOCULAR LENS PLACEMENT (IOC) LEFT 8.38 01:09.7;  Surgeon:  Annell Kidney, MD;  Location: [facility];  Service: Ophthalmology;  Laterality: Left;   CATARACT EXTRACTION W/PHACO Right 10/23/2021   Procedure: CATARACT EXTRACTION PHACO AND INTRAOCULAR LENS PLACEMENT (IOC) RIGHT 5.51 00:48.8;  Surgeon: Annell Kidney, MD;  Location: [facility];  Service: Ophthalmology;  Laterality: Right;   COLONOSCOPY     COLONOSCOPY WITH PROPOFOL  N/A 11/25/2022   Procedure: COLONOSCOPY WITH PROPOFOL ;  Surgeon: Selena Daily, MD;  Location: [facility];  Service: Endoscopy;  Laterality: N/A;   POLYPECTOMY  11/25/2022   Procedure: POLYPECTOMY;  Surgeon: Selena Daily, MD;  Location: [facility];  Service: Endoscopy;;   TONSILLECTOMY     TUBAL LIGATION      Social History   Tobacco Use   Smoking status: Every Day    Current packs/day: 0.50    Average packs/day: 0.5 packs/day for 59.4 years (29.7 ttl pk-yrs)    Types: Cigarettes    Start date: 1966    Passive exposure: Never   Smokeless tobacco: Never   Tobacco comments:    aware needs to stop   Vaping Use   Vaping status: Never Used  Substance Use Topics   Alcohol use: Yes    Comment: 1 glass of wine monthly or less   Drug use: No     Medication list has been reviewed and updated.  Current Meds  Medication Sig   aspirin EC 81 MG tablet Take 81 mg by mouth daily. Swallow whole.   Baclofen 5 MG TABS Take 5 mg by mouth as needed (for headache rescue).   colestipol (COLESTID) 1 g tablet Take 1 g by mouth daily.   lovastatin  (MEVACOR ) 20 MG tablet TAKE (1) TABLET BY MOUTH EVERY DAY   Melatonin 10 MG TABS Take 1 tablet by mouth at bedtime.   montelukast  (SINGULAIR ) 10 MG tablet TAKE (1) TABLET BY MOUTH EVERY DAY   Multiple Vitamins-Minerals (CENTRUM SILVER ADULT 50+ PO) Take 1 capsule by mouth daily at 6 (six) AM.   tiZANidine (ZANAFLEX) 2 MG tablet Take 2 mg by mouth as needed.       07/06/2023    1:30 PM 06/01/2023    2:35 PM 05/05/2023   11:23 AM  01/09/2023    8:08 AM  GAD 7 : Generalized Anxiety Score  Nervous, Anxious, on Edge 0 0 0 0  Control/stop worrying 0 0 0 0  Worry too much - different things 0 0 0 0  Trouble relaxing 0 0 0 0  Restless 0 0 0 0  Easily annoyed or irritable 0 0 0 0  Afraid - awful might happen 0 0 0 0  Total GAD 7 Score 0 0 0 0  Anxiety Difficulty Not difficult at all  Not difficult at all Not difficult at all       07/06/2023    1:30 PM 06/03/2023   10:23 AM 06/01/2023    2:35 PM  Depression screen PHQ 2/9  Decreased Interest 0 0 0  Down, Depressed, Hopeless 0 0 0  PHQ - 2 Score 0 0 0  Altered sleeping 3 0   Tired, decreased  energy 3 1   Change in appetite 0 0   Feeling bad or failure about yourself  0 0   Trouble concentrating 0 0   Moving slowly or fidgety/restless 0 0   Suicidal thoughts 0 0   PHQ-9 Score 6 1   Difficult doing work/chores Not difficult at all Not difficult at all     BP Readings from Last 3 Encounters:  07/06/23 122/78  06/09/23 (!) 152/83  06/01/23 116/74    Physical Exam Vitals and nursing note reviewed.  Constitutional:      General: She is not in acute distress.    Appearance: She is not diaphoretic.  HENT:     Head: Normocephalic and atraumatic.     Right Ear: External ear normal.     Left Ear: External ear normal.     Nose: Nose normal.  Eyes:     General:        Right eye: No discharge.        Left eye: No discharge.     Conjunctiva/sclera: Conjunctivae normal.     Pupils: Pupils are equal, round, and reactive to light.  Neck:     Thyroid : No thyromegaly.     Vascular: No JVD.  Cardiovascular:     Rate and Rhythm: Normal rate and regular rhythm.     Heart sounds: Normal heart sounds. No murmur heard.    No friction rub. No gallop.  Pulmonary:     Effort: Pulmonary effort is normal.     Breath sounds: Normal breath sounds. No wheezing, rhonchi or rales.  Abdominal:     General: Bowel sounds are normal.     Palpations: Abdomen is soft. There is  no mass.     Tenderness: There is no abdominal tenderness. There is no guarding or rebound.  Musculoskeletal:        General: Normal range of motion.     Cervical back: Normal range of motion and neck supple.     Lumbar back: Spasms and tenderness present. Normal range of motion. No scoliosis.       Back:  Lymphadenopathy:     Cervical: No cervical adenopathy.  Skin:    General: Skin is warm and dry.  Neurological:     Mental Status: She is alert.     Deep Tendon Reflexes: Reflexes are normal and symmetric.     Wt Readings from Last 3 Encounters:  07/06/23 159 lb 4 oz (72.2 kg)  06/09/23 160 lb 6.4 oz (72.8 kg)  06/03/23 160 lb (72.6 kg)    BP 122/78   Pulse 67   Ht 5\' 7"  (1.702 m)   Wt 159 lb 4 oz (72.2 kg)   SpO2 99%   BMI 24.94 kg/m   Assessment and Plan: 1. Frequent urination (Primary) New onset.  Controlled.  Patient is having  frequency without urgency and dysuria consistent with UTI.  There is no evidence of leukocytes or erythrocytes and we will continue to observe and that if this symptomatology is to continue maybe we would entertain the possible possibility of urethral-itis but given the lack of symptomatology this seems to be more of a musculoskeletal see below - POCT urinalysis dipstick  2. Lumbar back pain Primarily patient was noting that she has had a left flank pain that there is tenderness noted over the area as well as some spasm of the left paraspinal muscles.  This is consistent with likely strain I will continue to watch this but I do  not feel that this is of a nephrotic concern and that is well inferior to the CVA angle.  3. Fatigue, unspecified type Patient has a a history of fatigue in the past and upon review of previous labs that are all within a year TSH sed rate CBC and BMP this was all noted to be in normal range.  We discussed that a lot of her PHQ scoring was I fatigue concentration irritability and insomnia which in some cases may contribute  to daytime somnolence which is interpreted as fatigue.  We have discussed proper sleep hygiene and we will initiate a trial of trazodone  50 mg 1/2 to 1 tablet that taken for bedtime and that the possibility that depression may play a role in her fatigue as a diagnosis of exclusion of correctable causes of fatigue.  4. Primary insomnia Chronic.  Patient has a spouse that has not been very helpful in her sleeping and that he is up a lot at night and she has an awareness of this and has unable to relax and sleep.  We will initiate some trazodone  given the patient's age and we will see if this may help with both the insomnia perhaps with the depression as well - traZODone  (DESYREL ) 50 MG tablet; Take 0.5-1 tablets (25-50 mg total) by mouth at bedtime as needed for sleep.  Dispense: 30 tablet; Refill: 3     Alayne Allis, MD

## 2023-07-07 ENCOUNTER — Encounter (INDEPENDENT_AMBULATORY_CARE_PROVIDER_SITE_OTHER): Payer: Self-pay

## 2023-07-17 ENCOUNTER — Encounter: Payer: Self-pay | Admitting: Family Medicine

## 2023-09-08 ENCOUNTER — Ambulatory Visit (INDEPENDENT_AMBULATORY_CARE_PROVIDER_SITE_OTHER)

## 2023-09-08 ENCOUNTER — Ambulatory Visit (INDEPENDENT_AMBULATORY_CARE_PROVIDER_SITE_OTHER): Admitting: Vascular Surgery

## 2023-09-08 ENCOUNTER — Encounter (INDEPENDENT_AMBULATORY_CARE_PROVIDER_SITE_OTHER): Payer: Self-pay | Admitting: Vascular Surgery

## 2023-09-08 VITALS — BP 137/85 | HR 68 | Ht 67.0 in | Wt 158.0 lb

## 2023-09-08 DIAGNOSIS — I872 Venous insufficiency (chronic) (peripheral): Secondary | ICD-10-CM | POA: Diagnosis not present

## 2023-09-08 DIAGNOSIS — E785 Hyperlipidemia, unspecified: Secondary | ICD-10-CM

## 2023-09-08 DIAGNOSIS — I83893 Varicose veins of bilateral lower extremities with other complications: Secondary | ICD-10-CM | POA: Diagnosis not present

## 2023-09-08 NOTE — Assessment & Plan Note (Signed)
 Venous reflux study was performed today showing no evidence of deep venous thrombosis or superficial thrombophlebitis in either lower extremity.  Great saphenous vein showed no reflux on either side, but the small saphenous vein demonstrated reflux in both lower extremities. I discussed at some length the risks and benefits of laser ablation of the small saphenous vein.  The patient has CEAP class IV venous insufficiency with swelling and skin changes.  She does not have any limb threatening symptoms but she does have lifestyle altering symptoms.  She wants to go home and consider the procedure but is leaning towards doing it.  We will begin the preapproval process and then contact her.

## 2023-09-08 NOTE — Progress Notes (Signed)
 MRN : [id number]  Caroline Harris is a 78 y.o. (1946-01-01) female who presents with chief complaint of  Chief Complaint  Patient presents with   3 months venous reflux + see bp/jd  .  History of Present Illness: Patient returns today in follow up of her leg swelling and varicose veins.  She returns 3 months after show visit.  She says her leg swelling is really about the same she has not noticed much difference despite wearing compression socks daily, elevating her legs, and continuing to be active and exercise.  No new ulceration or infection.  No chest pain or shortness of breath. Venous reflux study was performed today showing no evidence of deep venous thrombosis or superficial thrombophlebitis in either lower extremity.  Great saphenous vein showed no reflux on either side, but the small saphenous vein demonstrated reflux in both lower extremities.  Current Outpatient Medications  Medication Sig Dispense Refill   aspirin EC 81 MG tablet Take 81 mg by mouth daily. Swallow whole.     Baclofen 5 MG TABS Take 5 mg by mouth as needed (for headache rescue).     colestipol (COLESTID) 1 g tablet Take 1 g by mouth daily.     lovastatin  (MEVACOR ) 20 MG tablet TAKE (1) TABLET BY MOUTH EVERY DAY 90 tablet 1   Melatonin 10 MG TABS Take 1 tablet by mouth at bedtime.     montelukast  (SINGULAIR ) 10 MG tablet TAKE (1) TABLET BY MOUTH EVERY DAY 90 tablet 3   Multiple Vitamins-Minerals (CENTRUM SILVER ADULT 50+ PO) Take 1 capsule by mouth daily at 6 (six) AM.     traZODone  (DESYREL ) 50 MG tablet Take 0.5-1 tablets (25-50 mg total) by mouth at bedtime as needed for sleep. 30 tablet 3   No current facility-administered medications for this visit.    Past Medical History:  Diagnosis Date   Aortic atherosclerosis (HCC)    Arthritis    In fingers   CAD (coronary artery disease)    Cataract March, 2023   Will have removed   Colitis    Collagenous colitis    COPD, moderate (HCC) 08/23/2015    Diverticulitis    Diverticulitis 03/29/2021   Febrile seizure (HCC)    as infant   Headache    History of CVA (cerebrovascular accident)    Hyperlipidemia    Mild intermittent asthma    New persistent daily headache    Osteopenia    Restless leg    Wears dentures    partial lower    Past Surgical History:  Procedure Laterality Date   ABDOMINAL HYSTERECTOMY     CATARACT EXTRACTION W/PHACO Left 10/09/2021   Procedure: CATARACT EXTRACTION PHACO AND INTRAOCULAR LENS PLACEMENT (IOC) LEFT 8.38 01:09.7;  Surgeon: Mittie Gaskin, MD;  Location: [facility];  Service: Ophthalmology;  Laterality: Left;   CATARACT EXTRACTION W/PHACO Right 10/23/2021   Procedure: CATARACT EXTRACTION PHACO AND INTRAOCULAR LENS PLACEMENT (IOC) RIGHT 5.51 00:48.8;  Surgeon: Mittie Gaskin, MD;  Location: [facility];  Service: Ophthalmology;  Laterality: Right;   COLONOSCOPY     COLONOSCOPY WITH PROPOFOL  N/A 11/25/2022   Procedure: COLONOSCOPY WITH PROPOFOL ;  Surgeon: Unk Corinn Skiff, MD;  Location: [facility];  Service: Endoscopy;  Laterality: N/A;   POLYPECTOMY  11/25/2022   Procedure: POLYPECTOMY;  Surgeon: Unk Corinn Skiff, MD;  Location: [facility];  Service: Endoscopy;;   TONSILLECTOMY     TUBAL LIGATION       Social History  Tobacco Use   Smoking status: Every Day    Current packs/day: 0.50    Average packs/day: 0.5 packs/day for 59.6 years (29.8 ttl pk-yrs)    Types: Cigarettes    Start date: 1966    Passive exposure: Never   Smokeless tobacco: Never   Tobacco comments:    aware needs to stop   Vaping Use   Vaping status: Never Used  Substance Use Topics   Alcohol use: Yes    Comment: 1 glass of wine monthly or less   Drug use: No      Family History  Problem Relation Age of Onset   Breast cancer Mother 24   Cancer Mother    Miscarriages / [place] Mother    Heart attack Father    Heart disease Father     No Known  Allergies   REVIEW OF SYSTEMS (Negative unless checked)  Constitutional: [] Weight loss  [] Fever  [] Chills Cardiac: [] Chest pain   [] Chest pressure   [] Palpitations   [] Shortness of breath when laying flat   [] Shortness of breath at rest   [] Shortness of breath with exertion. Vascular:  [] Pain in legs with walking   [] Pain in legs at rest   [] Pain in legs when laying flat   [] Claudication   [] Pain in feet when walking  [] Pain in feet at rest  [] Pain in feet when laying flat   [] History of DVT   [] Phlebitis   [x] Swelling in legs   [x] Varicose veins   [] Non-healing ulcers Pulmonary:   [] Uses home oxygen    [] Productive cough   [] Hemoptysis   [] Wheeze  [x] COPD   [] Asthma Neurologic:  [] Dizziness  [] Blackouts   [] Seizures   [] History of stroke   [] History of TIA  [] Aphasia   [] Temporary blindness   [] Dysphagia   [] Weakness or numbness in arms   [] Weakness or numbness in legs Musculoskeletal:  [] Arthritis   [] Joint swelling   [] Joint pain   [] Low back pain Hematologic:  [] Easy bruising  [] Easy bleeding   [] Hypercoagulable state   [] Anemic   Gastrointestinal:  [] Blood in stool   [] Vomiting blood  [] Gastroesophageal reflux/heartburn   [] Abdominal pain Genitourinary:  [] Chronic kidney disease   [] Difficult urination  [] Frequent urination  [] Burning with urination   [] Hematuria Skin:  [] Rashes   [] Ulcers   [] Wounds Psychological:  [] History of anxiety   []  History of major depression.  Physical Examination  BP 137/85   Pulse 68   Ht 5' 7 (1.702 m)   Wt 158 lb (71.7 kg)   BMI 24.75 kg/m  Gen:  WD/WN, NAD. Appears younger than stated age. Head: [place]/AT, No temporalis wasting. Ear/Nose/Throat: Hearing grossly intact, nares w/o erythema or drainage Eyes: Conjunctiva clear. Sclera non-icteric Neck: Supple.  Trachea midline Pulmonary:  Good air movement, no use of accessory muscles.  Cardiac: RRR, no JVD Vascular:  Vessel Right Left  Radial Palpable Palpable                          PT  Palpable Palpable  DP Palpable Palpable   Gastrointestinal: soft, non-tender/non-distended. No guarding/reflex.  Musculoskeletal: M/S 5/5 throughout.  No deformity or atrophy. Diffuse varicosities bilaterally. Trace bilateral LE edema. Neurologic: Sensation grossly intact in extremities.  Symmetrical.  Speech is fluent.  Psychiatric: Judgment intact, Mood & affect appropriate for pt's clinical situation. Dermatologic: No rashes or ulcers noted.  No cellulitis or open wounds.      Labs Recent Results (from the past  2160 hours)  POCT urinalysis dipstick     Status: None   Collection Time: 07/06/23  1:38 PM  Result Value Ref Range   Color, UA YELLOW    Clarity, UA CLEAR    Glucose, UA Negative Negative   Bilirubin, UA NEGATIVE    Ketones, UA NEGATIVE    Spec Grav, UA 1.010 1.010 - 1.025   Blood, UA NEGATIVE    pH, UA 6.0 5.0 - 8.0   Protein, UA Negative Negative   Urobilinogen, UA 0.2 0.2 or 1.0 E.U./dL   Nitrite, UA NEGATIVE    Leukocytes, UA Negative Negative   Appearance     Odor      Radiology No results found.  Assessment/Plan  Varicose veins of leg with swelling, bilateral Venous reflux study was performed today showing no evidence of deep venous thrombosis or superficial thrombophlebitis in either lower extremity.  Great saphenous vein showed no reflux on either side, but the small saphenous vein demonstrated reflux in both lower extremities. I discussed at some length the risks and benefits of laser ablation of the small saphenous vein.  The patient has CEAP class IV venous insufficiency with swelling and skin changes.  She does not have any limb threatening symptoms but she does have lifestyle altering symptoms.  She wants to go home and consider the procedure but is leaning towards doing it.  We will begin the preapproval process and then contact her.  Hyperlipidemia lipid control important in reducing the progression of atherosclerotic disease. Continue statin  therapy    Selinda Gu, MD  09/08/2023 2:50 PM    This note was created with Dragon medical transcription system.  Any errors from dictation are purely unintentional

## 2023-09-08 NOTE — Assessment & Plan Note (Signed)
 lipid control important in reducing the progression of atherosclerotic disease. Continue statin therapy

## 2023-10-01 ENCOUNTER — Telehealth (INDEPENDENT_AMBULATORY_CARE_PROVIDER_SITE_OTHER): Payer: Self-pay | Admitting: Vascular Surgery

## 2023-10-01 NOTE — Telephone Encounter (Signed)
 Patient is scheduled for left leg SSV laser ablation with Dr. Marea on 9.19.25 and right leg SSV laser on 10.21.25. Patient will need the standard RX protocol X 2 called in [facility] in [place]. Thank you.

## 2023-10-02 ENCOUNTER — Other Ambulatory Visit (INDEPENDENT_AMBULATORY_CARE_PROVIDER_SITE_OTHER): Payer: Self-pay

## 2023-10-02 NOTE — Telephone Encounter (Signed)
 sent

## 2023-10-21 DIAGNOSIS — S81802A Unspecified open wound, left lower leg, initial encounter: Secondary | ICD-10-CM | POA: Diagnosis not present

## 2023-10-21 MED ORDER — ALPRAZOLAM 0.5 MG PO TABS
ORAL_TABLET | ORAL | 0 refills | Status: AC
Start: 2023-11-06 — End: ?

## 2023-10-21 MED ORDER — ALPRAZOLAM 0.5 MG PO TABS: ORAL_TABLET | ORAL | 0 refills | Status: AC

## 2023-10-28 ENCOUNTER — Encounter: Payer: Self-pay | Admitting: Gastroenterology

## 2023-10-29 ENCOUNTER — Encounter: Payer: Self-pay | Admitting: Gastroenterology

## 2023-11-02 ENCOUNTER — Encounter (INDEPENDENT_AMBULATORY_CARE_PROVIDER_SITE_OTHER): Payer: Self-pay | Admitting: Vascular Surgery

## 2023-11-04 NOTE — Telephone Encounter (Signed)
 Patient LVM wanting to know status of this message. Please advise.

## 2023-11-04 NOTE — Telephone Encounter (Signed)
 I spoke with JD, as long as the wound is down her leg and won't interfere with his gaining access he is ok to move forward.  She mentioned they were sending a picture but I don't see any picture noted of the wound

## 2023-11-05 DIAGNOSIS — D492 Neoplasm of unspecified behavior of bone, soft tissue, and skin: Secondary | ICD-10-CM | POA: Diagnosis not present

## 2023-11-05 NOTE — Telephone Encounter (Signed)
 Spoke with patient to advise of comments. Patient stated that she finished the antibiotics and she has an appt with dermatology today and hopes it will be better by Tuesday. She states that [facility] hadn't contacted her regarding the RX for Xanax  and I advised that they have the RX as we have a confirmed receipt from 9.3.25. Pt acknowledged and states that she will call them.

## 2023-11-06 ENCOUNTER — Other Ambulatory Visit (INDEPENDENT_AMBULATORY_CARE_PROVIDER_SITE_OTHER): Admitting: Vascular Surgery

## 2023-11-10 ENCOUNTER — Ambulatory Visit (INDEPENDENT_AMBULATORY_CARE_PROVIDER_SITE_OTHER): Admitting: Vascular Surgery

## 2023-11-10 VITALS — BP 130/78 | HR 68 | Resp 16 | Ht 67.0 in | Wt 155.6 lb

## 2023-11-10 DIAGNOSIS — I83893 Varicose veins of bilateral lower extremities with other complications: Secondary | ICD-10-CM | POA: Diagnosis not present

## 2023-11-10 NOTE — Progress Notes (Signed)
 Varicose veins of leg with swelling, bilateral     The patient's left lower extremity was sterilely prepped and draped. The ultrasound machine was used to visualize the lesser saphenous vein throughout its course. A segment in the mid posterior calf was selected for access. The lesser saphenous vein was accessed without difficulty using ultrasound guidance with a micro puncture needle. A 0.018 wire was placed beyond the small saphenous-popliteal junction. The 65cm sheath was placed over the wire and the wire and dilator were removed. The laser fiber was placed through the sheath and its tip was placed approximately 4 cm below the small saphenous-popliteal junction. Tumescent anesthesia was then created with a dilute lidocaine  solution. Laser energy was then delivered with constant withdrawal of the sheath and laser fiber. Approximately 549 Joules of energy were delivered over a length of 12 cm using the 1470 Hz Venocare machine at [employer]. Sterile dressings were placed. The patient tolerated the procedure well without complications.

## 2023-11-13 ENCOUNTER — Encounter (INDEPENDENT_AMBULATORY_CARE_PROVIDER_SITE_OTHER)

## 2023-11-16 ENCOUNTER — Other Ambulatory Visit (INDEPENDENT_AMBULATORY_CARE_PROVIDER_SITE_OTHER): Payer: Self-pay | Admitting: Vascular Surgery

## 2023-11-16 DIAGNOSIS — I83893 Varicose veins of bilateral lower extremities with other complications: Secondary | ICD-10-CM

## 2023-11-17 ENCOUNTER — Ambulatory Visit (INDEPENDENT_AMBULATORY_CARE_PROVIDER_SITE_OTHER)

## 2023-11-17 DIAGNOSIS — I83893 Varicose veins of bilateral lower extremities with other complications: Secondary | ICD-10-CM

## 2023-11-19 DIAGNOSIS — Z9841 Cataract extraction status, right eye: Secondary | ICD-10-CM | POA: Diagnosis not present

## 2023-11-23 DIAGNOSIS — D492 Neoplasm of unspecified behavior of bone, soft tissue, and skin: Secondary | ICD-10-CM | POA: Diagnosis not present

## 2023-11-23 DIAGNOSIS — D485 Neoplasm of uncertain behavior of skin: Secondary | ICD-10-CM | POA: Diagnosis not present

## 2023-11-26 DIAGNOSIS — M778 Other enthesopathies, not elsewhere classified: Secondary | ICD-10-CM | POA: Diagnosis not present

## 2023-11-26 DIAGNOSIS — M2041 Other hammer toe(s) (acquired), right foot: Secondary | ICD-10-CM | POA: Diagnosis not present

## 2023-12-04 ENCOUNTER — Ambulatory Visit
Admission: RE | Admit: 2023-12-04 | Discharge: 2023-12-04 | Disposition: A | Discharge status: Home / Self Care | Attending: Gastroenterology | Admitting: Gastroenterology

## 2023-12-04 ENCOUNTER — Encounter
Admission: RE | Disposition: A | Discharge status: Home / Self Care | Payer: Self-pay | Source: Home / Self Care | Attending: Gastroenterology

## 2023-12-04 ENCOUNTER — Encounter: Payer: Self-pay | Admitting: Gastroenterology

## 2023-12-04 ENCOUNTER — Ambulatory Visit: Admitting: General Practice

## 2023-12-04 DIAGNOSIS — F17200 Nicotine dependence, unspecified, uncomplicated: Secondary | ICD-10-CM | POA: Insufficient documentation

## 2023-12-04 DIAGNOSIS — Z1211 Encounter for screening for malignant neoplasm of colon: Secondary | ICD-10-CM | POA: Insufficient documentation

## 2023-12-04 DIAGNOSIS — Z860100 Personal history of colon polyps, unspecified: Secondary | ICD-10-CM | POA: Diagnosis not present

## 2023-12-04 DIAGNOSIS — J4489 Other specified chronic obstructive pulmonary disease: Secondary | ICD-10-CM | POA: Diagnosis not present

## 2023-12-04 DIAGNOSIS — Z09 Encounter for follow-up examination after completed treatment for conditions other than malignant neoplasm: Secondary | ICD-10-CM | POA: Diagnosis not present

## 2023-12-04 DIAGNOSIS — F17210 Nicotine dependence, cigarettes, uncomplicated: Secondary | ICD-10-CM | POA: Diagnosis not present

## 2023-12-04 DIAGNOSIS — J449 Chronic obstructive pulmonary disease, unspecified: Secondary | ICD-10-CM | POA: Diagnosis not present

## 2023-12-04 DIAGNOSIS — D120 Benign neoplasm of cecum: Secondary | ICD-10-CM | POA: Diagnosis not present

## 2023-12-04 DIAGNOSIS — I2510 Atherosclerotic heart disease of native coronary artery without angina pectoris: Secondary | ICD-10-CM | POA: Insufficient documentation

## 2023-12-04 DIAGNOSIS — K5730 Diverticulosis of large intestine without perforation or abscess without bleeding: Secondary | ICD-10-CM | POA: Diagnosis not present

## 2023-12-04 DIAGNOSIS — D379 Neoplasm of uncertain behavior of digestive organ, unspecified: Secondary | ICD-10-CM | POA: Diagnosis not present

## 2023-12-04 DIAGNOSIS — K52831 Collagenous colitis: Secondary | ICD-10-CM | POA: Insufficient documentation

## 2023-12-04 DIAGNOSIS — Z9889 Other specified postprocedural states: Secondary | ICD-10-CM | POA: Insufficient documentation

## 2023-12-04 DIAGNOSIS — Z79899 Other long term (current) drug therapy: Secondary | ICD-10-CM | POA: Insufficient documentation

## 2023-12-04 DIAGNOSIS — Z860101 Personal history of adenomatous and serrated colon polyps: Secondary | ICD-10-CM | POA: Diagnosis not present

## 2023-12-04 DIAGNOSIS — K635 Polyp of colon: Secondary | ICD-10-CM | POA: Diagnosis not present

## 2023-12-04 HISTORY — DX: Low back pain, unspecified: M54.50

## 2023-12-04 HISTORY — DX: Unspecified cirrhosis of liver: K74.60

## 2023-12-04 HISTORY — DX: Aneurysm of other specified arteries: I72.8

## 2023-12-04 HISTORY — DX: Rectal polyp: K62.1

## 2023-12-04 HISTORY — DX: Benign neoplasm of cecum: D12.0

## 2023-12-04 HISTORY — DX: Mammographic calcification found on diagnostic imaging of breast: R92.1

## 2023-12-04 HISTORY — DX: Cardiomegaly: I51.7

## 2023-12-04 HISTORY — PX: COLONOSCOPY: SHX5424

## 2023-12-04 HISTORY — DX: Varicose veins of bilateral lower extremities with other complications: I83.893

## 2023-12-04 SURGERY — COLONOSCOPY
Anesthesia: General

## 2023-12-04 MED ORDER — EPHEDRINE 5 MG/ML INJ
INTRAVENOUS | Status: AC
  Filled 2023-12-04: qty 5

## 2023-12-04 MED ORDER — SODIUM CHLORIDE 0.9 % IV SOLN: INTRAVENOUS | Status: DC

## 2023-12-04 MED ORDER — PROPOFOL 500 MG/50ML IV EMUL
INTRAVENOUS | Status: DC | PRN
  Administered 2023-12-04: 75 ug/kg/min via INTRAVENOUS

## 2023-12-04 MED ORDER — PROPOFOL 10 MG/ML IV BOLUS
INTRAVENOUS | Status: DC | PRN
  Administered 2023-12-04: 20 mg via INTRAVENOUS
  Administered 2023-12-04: 40 mg via INTRAVENOUS

## 2023-12-04 MED ORDER — EPHEDRINE SULFATE-NACL 50-0.9 MG/10ML-% IV SOSY
PREFILLED_SYRINGE | INTRAVENOUS | Status: DC | PRN
  Administered 2023-12-04: 10 mg via INTRAVENOUS

## 2023-12-04 MED ORDER — PROPOFOL 10 MG/ML IV BOLUS
INTRAVENOUS | Status: AC
  Filled 2023-12-04: qty 20

## 2023-12-04 MED ORDER — LIDOCAINE HCL (CARDIAC) PF 100 MG/5ML IV SOSY
PREFILLED_SYRINGE | INTRAVENOUS | Status: DC | PRN
  Administered 2023-12-04: 60 mg via INTRAVENOUS

## 2023-12-04 NOTE — Interval H&P Note (Signed)
 History and Physical Interval Note: Preprocedure H&P from 12/04/23  was reviewed and there was no interval change after seeing and examining the patient.  Written consent was obtained from the patient after discussion of risks, benefits, and alternatives. Patient has consented to proceed with Colonoscopy with possible intervention   12/04/2023 1:46 PM  Caroline Harris  has presented today for surgery, with the diagnosis of Z86.0100 (ICD-10-CM) - Personal history of colon polyps, unspecified.  The various methods of treatment have been discussed with the patient and family. After consideration of risks, benefits and other options for treatment, the patient has consented to  Procedure(s): COLONOSCOPY (N/A) as a surgical intervention.  The patient's history has been reviewed, patient examined, no change in status, stable for surgery.  I have reviewed the patient's chart and labs.  Questions were answered to the patient's satisfaction.     [name]

## 2023-12-04 NOTE — Anesthesia Postprocedure Evaluation (Signed)
 Anesthesia Post Note  Patient: Caroline Harris  Procedure(s) Performed: COLONOSCOPY  Patient location during evaluation: Endoscopy Anesthesia Type: General Level of consciousness: awake and alert Pain management: pain level controlled Vital Signs Assessment: post-procedure vital signs reviewed and stable Respiratory status: spontaneous breathing, nonlabored ventilation, respiratory function stable and patient connected to nasal cannula oxygen  Cardiovascular status: blood pressure returned to baseline and stable Postop Assessment: no apparent nausea or vomiting Anesthetic complications: no   No notable events documented.   Last Vitals:  Vitals:   12/04/23 1452 12/04/23 1454  BP: (!) 135/114 (!) 135/114  Pulse: 63   Resp: 17 18  Temp:    SpO2: 92%     Last Pain:  Vitals:   12/04/23 1454  TempSrc:   PainSc: 0-No pain                 Debby Mines

## 2023-12-04 NOTE — H&P (Signed)
 Pre-Procedure H&P   Patient ID: Caroline Harris is a 78 y.o. female.  Gastroenterology Provider: [name], DO  Referring Provider: Romero Antigua, PA PCP: Steva Clotilda DEL, NP  Date: 12/04/2023  HPI [name] is a 78 y.o. female who presents today for Colonoscopy for Personal history of colon polyp .  Patient underwent colonoscopy in 11/2022 with 40 mm polyp removed from the cecum demonstrating sessile serrated pathology.  Normal TI and diverticulosis was also present.  She has a history of collagenous colitis and was on longstanding budesonide .  She has been off of the budesonide  for some time and transition to once daily colestipol with good effect.  No family history of colon cancer or colon polyps   Past Medical History:  Diagnosis Date   Adenomatous polyp of cecum    Aortic atherosclerosis    Arthritis    In fingers   Breast calcifications    CAD (coronary artery disease)    Cataract March, 2023   Will have removed   Cirrhosis, non-alcoholic (HCC)    Colitis    Collagenous colitis    COPD, moderate (HCC) 08/23/2015   Diverticulitis    Diverticulitis 03/29/2021   Febrile seizure (HCC)    as infant   Headache    History of CVA (cerebrovascular accident)    Hyperlipidemia    Lumbar back pain    Mild cardiomegaly    Mild intermittent asthma    New persistent daily headache    Osteopenia    Rectal polyp    Restless leg    Splenic artery aneurysm    Splenic artery aneurysm    Varicose veins of leg with swelling, bilateral    Wears dentures    partial lower    Past Surgical History:  Procedure Laterality Date   ABDOMINAL HYSTERECTOMY     CATARACT EXTRACTION W/PHACO Left 10/09/2021   Procedure: CATARACT EXTRACTION PHACO AND INTRAOCULAR LENS PLACEMENT (IOC) LEFT 8.38 01:09.7;  Surgeon: Mittie Gaskin, MD;  Location: [facility];  Service: Ophthalmology;  Laterality: Left;   CATARACT EXTRACTION W/PHACO Right 10/23/2021    Procedure: CATARACT EXTRACTION PHACO AND INTRAOCULAR LENS PLACEMENT (IOC) RIGHT 5.51 00:48.8;  Surgeon: Mittie Gaskin, MD;  Location: [facility];  Service: Ophthalmology;  Laterality: Right;   COLONOSCOPY     COLONOSCOPY WITH PROPOFOL  N/A 11/25/2022   Procedure: COLONOSCOPY WITH PROPOFOL ;  Surgeon: Unk Corinn Skiff, MD;  Location: [facility];  Service: Endoscopy;  Laterality: N/A;   EYE SURGERY     POLYPECTOMY  11/25/2022   Procedure: POLYPECTOMY;  Surgeon: Unk Corinn Skiff, MD;  Location: [facility];  Service: Endoscopy;;   TONSILLECTOMY     TUBAL LIGATION      Family History No h/o GI disease or malignancy  Review of Systems  Constitutional:  Negative for activity change, appetite change, chills, diaphoresis, fatigue, fever and unexpected weight change.  HENT:  Negative for trouble swallowing and voice change.   Respiratory:  Negative for shortness of breath and wheezing.   Cardiovascular:  Negative for chest pain, palpitations and leg swelling.  Gastrointestinal:  Negative for abdominal distention, abdominal pain, anal bleeding, blood in stool, constipation, diarrhea, nausea, rectal pain and vomiting.  Musculoskeletal:  Negative for arthralgias and myalgias.  Skin:  Negative for color change and pallor.  Neurological:  Negative for dizziness, syncope and weakness.  Psychiatric/Behavioral:  Negative for confusion.   All other systems reviewed and are negative.    Medications No  current facility-administered medications on file prior to encounter.   Current Outpatient Medications on File Prior to Encounter  Medication Sig Dispense Refill   aspirin EC 81 MG tablet Take 81 mg by mouth daily. Swallow whole.     cholecalciferol (VITAMIN D3) 10 MCG (400 UNIT) TABS tablet Take 400 Units by mouth.     losartan (COZAAR) 25 MG tablet Take 25 mg by mouth 2 (two) times daily.     lovastatin  (MEVACOR ) 20 MG tablet TAKE (1) TABLET BY MOUTH EVERY DAY 90  tablet 1   MAGNESIUM GLYCINATE PLUS PO Take 400 mg by mouth at bedtime.     montelukast  (SINGULAIR ) 10 MG tablet TAKE (1) TABLET BY MOUTH EVERY DAY 90 tablet 3   traZODone  (DESYREL ) 50 MG tablet Take 0.5-1 tablets (25-50 mg total) by mouth at bedtime as needed for sleep. 30 tablet 3   Baclofen 5 MG TABS Take 5 mg by mouth as needed (for headache rescue).     colestipol (COLESTID) 1 g tablet Take 1 g by mouth daily.     Melatonin 10 MG TABS Take 1 tablet by mouth at bedtime.     Multiple Vitamins-Minerals (CENTRUM SILVER ADULT 50+ PO) Take 1 capsule by mouth daily at 6 (six) AM.      Pertinent medications related to GI and procedure were reviewed by me with the patient prior to the procedure   Current Facility-Administered Medications:    0.9 %  sodium chloride  infusion, , Intravenous, Continuous, Onita Elspeth Sharper, DO, Last Rate: 20 mL/hr at 12/04/23 1325, New Bag at 12/04/23 1325  sodium chloride  20 mL/hr at 12/04/23 1325       No Known Allergies Allergies were reviewed by me prior to the procedure  Objective   Body mass index is 23.96 kg/m. Vitals:   12/04/23 1310  BP: 122/67  Pulse: 60  Resp: 15  Temp: 97.8 F (36.6 C)  TempSrc: Temporal  SpO2: 100%  Weight: 69.4 kg  Height: 5' 7 (1.702 m)     Physical Exam Vitals and nursing note reviewed.  Constitutional:      General: She is not in acute distress.    Appearance: Normal appearance. She is not ill-appearing, toxic-appearing or diaphoretic.  HENT:     Head: Normocephalic and atraumatic.     Nose: Nose normal.     Mouth/Throat:     Mouth: Mucous membranes are moist.     Pharynx: Oropharynx is clear.  Eyes:     General: No scleral icterus.    Extraocular Movements: Extraocular movements intact.  Cardiovascular:     Rate and Rhythm: Regular rhythm. Bradycardia present.     Heart sounds: Normal heart sounds. No murmur heard.    No friction rub. No gallop.  Pulmonary:     Effort: Pulmonary effort is  normal. No respiratory distress.     Breath sounds: Normal breath sounds. No wheezing, rhonchi or rales.  Abdominal:     General: Bowel sounds are normal. There is no distension.     Palpations: Abdomen is soft.     Tenderness: There is no abdominal tenderness. There is no guarding or rebound.  Musculoskeletal:     Cervical back: Neck supple.     Right lower leg: No edema.     Left lower leg: No edema.  Skin:    General: Skin is warm and dry.     Coloration: Skin is not jaundiced or pale.  Neurological:     General: No focal  deficit present.     Mental Status: She is alert and oriented to person, place, and time. Mental status is at baseline.  Psychiatric:        Mood and Affect: Mood normal.        Behavior: Behavior normal.        Thought Content: Thought content normal.        Judgment: Judgment normal.      Assessment:  [name] is a 78 y.o. female  who presents today for Colonoscopy for Personal history of colon polyp .  Plan:  Colonoscopy with possible intervention today  Colonoscopy with possible biopsy, control of bleeding, polypectomy, and interventions as necessary has been discussed with the patient/patient representative. Informed consent was obtained from the patient/patient representative after explaining the indication, nature, and risks of the procedure including but not limited to death, bleeding, perforation, missed neoplasm/lesions, cardiorespiratory compromise, and reaction to medications. Opportunity for questions was given and appropriate answers were provided. Patient/patient representative has verbalized understanding is amenable to undergoing the procedure.   [name], DO  [facility]  Portions of the record may have been created with voice recognition software. Occasional wrong-word or 'sound-a-like' substitutions may have occurred due to the inherent limitations of voice recognition software.  Read the chart  carefully and recognize, using context, where substitutions may have occurred.

## 2023-12-04 NOTE — Transfer of Care (Signed)
 Immediate Anesthesia Transfer of Care Note  Patient: Caroline Harris  Procedure(s) Performed: COLONOSCOPY  Patient Location: PACU  Anesthesia Type:General  Level of Consciousness: sedated  Airway & Oxygen  Therapy: Patient Spontanous Breathing  Post-op Assessment: Report given to RN and Post -op Vital signs reviewed and stable  Post vital signs: Reviewed and stable  Last Vitals:  Vitals Value Taken Time  BP 105/66 12/04/23 14:33  Temp 35.6 C 12/04/23 14:32  Pulse 65 12/04/23 14:37  Resp 21 12/04/23 14:38  SpO2 100 % 12/04/23 14:37  Vitals shown include unfiled device data.  Last Pain:  Vitals:   12/04/23 1432  TempSrc: Temporal  PainSc: 0-No pain         Complications: No notable events documented.

## 2023-12-04 NOTE — Op Note (Signed)
 [facility] Gastroenterology Patient Name: Caroline Harris Procedure Date: 12/04/2023 1:49 PM MRN: [id number] Account #: [id number] Date of Birth: 29-Mar-1945 Admit Type: Outpatient Age: 78 Room: [facility] ENDO ROOM 1 Gender: Female Note Status: Finalized Instrument Name: Peds Colonoscope [id number] Procedure:             Colonoscopy Indications:           High risk colon cancer surveillance: Personal history                         of colonic polyps Providers:             [name] DO, DO Referring MD:          No Local Md, MD (Referring MD) Medicines:             Monitored Anesthesia Care Complications:         No immediate complications. Estimated blood loss:                         Minimal. Procedure:             Pre-Anesthesia Assessment:                        - Prior to the procedure, a History and Physical was                         performed, and patient medications and allergies were                         reviewed. The patient is competent. The risks and                         benefits of the procedure and the sedation options and                         risks were discussed with the patient. All questions                         were answered and informed consent was obtained.                         Patient identification and proposed procedure were                         verified by the physician, the nurse, the anesthetist                         and the technician in the endoscopy suite. Mental                         Status Examination: alert and oriented. Airway                         Examination: normal oropharyngeal airway and neck                         mobility. Respiratory Examination: clear to  auscultation. CV Examination: RRR, no murmurs, no S3                         or S4. Prophylactic Antibiotics: The patient does not                         require prophylactic antibiotics. Prior                          Anticoagulants: The patient has taken no anticoagulant                         or antiplatelet agents. ASA Grade Assessment: III - A                         patient with severe systemic disease. After reviewing                         the risks and benefits, the patient was deemed in                         satisfactory condition to undergo the procedure. The                         anesthesia plan was to use monitored anesthesia care                         (MAC). Immediately prior to administration of                         medications, the patient was re-assessed for adequacy                         to receive sedatives. The heart rate, respiratory                         rate, oxygen  saturations, blood pressure, adequacy of                         pulmonary ventilation, and response to care were                         monitored throughout the procedure. The physical                         status of the patient was re-assessed after the                         procedure.                        After obtaining informed consent, the colonoscope was                         passed under direct vision. Throughout the procedure,                         the patient's blood pressure, pulse, and oxygen   saturations were monitored continuously. The                         Colonoscope was introduced through the anus and                         advanced to the the terminal ileum, with                         identification of the appendiceal orifice and IC                         valve. The colonoscopy was somewhat difficult due to                         multiple diverticula in the colon and restricted                         mobility of the colon. Successful completion of the                         procedure was aided by changing the patient to a                         supine position, straightening and shortening the                         scope to obtain bowel loop  reduction and lavage. The                         quality of the bowel preparation was evaluated using                         the BBPS [facility] Bowel Preparation Scale) with scores                         of: Right Colon = 2 (minor amount of residual                         staining, small fragments of stool and/or opaque                         liquid, but mucosa seen well), Transverse Colon = 3                         (entire mucosa seen well with no residual staining,                         small fragments of stool or opaque liquid) and Left                         Colon = 2 (minor amount of residual staining, small                         fragments of stool and/or opaque liquid, but mucosa  seen well). The total BBPS score equals 7. The quality                         of the bowel preparation was good. The terminal ileum,                         ileocecal valve, appendiceal orifice, and rectum were                         photographed. Findings:      The perianal and digital rectal examinations were normal. Pertinent       negatives include normal sphincter tone.      The terminal ileum appeared normal. Estimated blood loss: none.      Retroflexion in the right colon was performed.      Multiple small-mouthed diverticula were found in the sigmoid colon.       Estimated blood loss: none.      A 3 to 4 mm post polypectomy scar was found in the cecum. There was       residual polyp tissue. Imaging was performed using white light and       narrow band imaging to visualize the mucosa. The polyp was removed with       a cold biopsy forceps and The polyp was removed with a cold snare.       Resection and retrieval were complete. Estimated blood loss was minimal.      A ~100mm in size polyp was found in the descending colon. The polyp was       lateral spreading. Polypectomy was not attempted due to polyp size (too       large to be excised). Area was tattooed with an  injection of 1 mL of       [place] ink. Tattoo placed distal to lesion. Lesion spread laterally over       2 different folds going into a third.      The exam was otherwise without abnormality on direct and retroflexion       views. Impression:            - The examined portion of the ileum was normal.                        - Diverticulosis in the sigmoid colon.                        - Post-polypectomy scar in the cecum.                        - One ~24mm in size polyp in the descending colon.                         Resection not attempted. Tattooed.                        - The examination was otherwise normal on direct and                         retroflexion views. Recommendation:        - Patient has a contact number available for  emergencies. The signs and symptoms of potential                         delayed complications were discussed with the patient.                         Return to normal activities tomorrow. Written                         discharge instructions were provided to the patient.                        - Discharge patient to home.                        - Resume previous diet.                        - Continue present medications.                        - Await pathology results.                        - Refer to [facility] at appointment to be                         scheduled.                        - The findings and recommendations were discussed with                         the patient. Procedure Code(s):     --- Professional ---                        [phone], Colonoscopy, flexible; with removal of                         tumor(s), polyp(s), or other lesion(s) by snare                         technique                        [id number], Colonoscopy, flexible; with directed submucosal                         injection(s), any substance Diagnosis Code(s):     --- Professional ---                        Z86.010, Personal history of  colonic polyps                        Z98.890, Other specified postprocedural states                        D12.4, Benign neoplasm of descending colon                        K57.30, Diverticulosis of large intestine without  perforation or abscess without bleeding CPT copyright 2022 American Medical Association. All rights reserved. The codes documented in this report are preliminary and upon coder review may  be revised to meet current compliance requirements. Attending Participation:      I personally performed the entire procedure. Elspeth Jungling, DO [name] DO, DO 12/04/2023 2:39:13 PM This report has been signed electronically. Number of Addenda: 0 Note Initiated On: 12/04/2023 1:49 PM Scope Withdrawal Time: 0 hours 17 minutes 14 seconds  Total Procedure Duration: 0 hours 31 minutes 58 seconds  Estimated Blood Loss:  Estimated blood loss was minimal.      [facility]

## 2023-12-04 NOTE — Anesthesia Preprocedure Evaluation (Signed)
 Anesthesia Evaluation  Patient identified by MRN, date of birth, ID band Patient awake    Reviewed: Allergy & Precautions, H&P , NPO status , Patient's Chart, lab work & pertinent test results  Airway Mallampati: III       Dental  (+) Partial Lower, Caps Multiple veneers, caps, crowns:   Pulmonary asthma , COPD, Current Smoker and Patient abstained from smoking.          Cardiovascular + CAD    Moderate coronary artery calcifications, recommend ASCVD risk assessment. Per CT chest 06-10-22     Neuro/Psych  Headaches, Seizures -,   history of left sided occipital neuralgia History left temporal lobe infarct  negative psych ROS   GI/Hepatic negative GI ROS, Neg liver ROS,,,  Endo/Other  negative endocrine ROS    Renal/GU      Musculoskeletal   Abdominal   Peds  Hematology negative hematology ROS (+)   Anesthesia Other Findings Hyperlipidemia Restless leg syndrome Diverticulitis Osteopenia Collagenous colitis  Colitis Diverticulitis  Arthritis Cataract  Febrile seizure Wears dentures  COPD, moderate  Headache  Cigarette smoker History of left temporal lobe infarct   Reproductive/Obstetrics negative OB ROS                              Anesthesia Physical Anesthesia Plan  ASA: 2  Anesthesia Plan: General   Post-op Pain Management: Minimal or no pain anticipated   Induction: Intravenous  PONV Risk Score and Plan: 2 and Propofol  infusion and TIVA  Airway Management Planned: Nasal Cannula  Additional Equipment: None  Intra-op Plan:   Post-operative Plan:   Informed Consent: I have reviewed the patients History and Physical, chart, labs and discussed the procedure including the risks, benefits and alternatives for the proposed anesthesia with the patient or authorized representative who has indicated his/her understanding and acceptance.     Dental advisory  given  Plan Discussed with: CRNA and Surgeon  Anesthesia Plan Comments: (Discussed risks of anesthesia with patient, including possibility of difficulty with spontaneous ventilation under anesthesia necessitating airway intervention, PONV, and rare risks such as cardiac or respiratory or neurological events, and allergic reactions. Discussed the role of CRNA in patient's perioperative care. Patient understands.)        Anesthesia Quick Evaluation

## 2023-12-05 ENCOUNTER — Encounter: Payer: Self-pay | Admitting: Gastroenterology

## 2023-12-07 LAB — SURGICAL PATHOLOGY

## 2023-12-08 ENCOUNTER — Ambulatory Visit (INDEPENDENT_AMBULATORY_CARE_PROVIDER_SITE_OTHER): Admitting: Vascular Surgery

## 2023-12-08 ENCOUNTER — Encounter (INDEPENDENT_AMBULATORY_CARE_PROVIDER_SITE_OTHER): Payer: Self-pay | Admitting: Vascular Surgery

## 2023-12-08 VITALS — BP 132/74 | HR 62 | Resp 16 | Wt 155.4 lb

## 2023-12-08 DIAGNOSIS — I83893 Varicose veins of bilateral lower extremities with other complications: Secondary | ICD-10-CM | POA: Diagnosis not present

## 2023-12-08 NOTE — Progress Notes (Signed)
 Varicose veins of leg with swelling, bilateral    The patient's right lower extremity was sterilely prepped and draped. The ultrasound machine was used to visualize the lesser saphenous vein throughout its course. A segment in the mid calf was selected for access. The lesser saphenous vein was accessed without difficulty using ultrasound guidance with a micro puncture needle. A 0.018 wire was placed beyond the small saphenous-popliteal junction. The 65cm sheath was placed over the wire and the wire and dilator were removed. The laser fiber was placed through the sheath and its tip was placed approximately 4 cm below the small saphenous-popliteal junction. Tumescent anesthesia was then created with a dilute lidocaine  solution. Laser energy was then delivered with constant withdrawal of the sheath and laser fiber. Approximately 514 Joules of energy were delivered over a length of 14 cm using the 1470 Hz Venocare machine at [employer]. Sterile dressings were placed. The patient tolerated the procedure well without complications.

## 2023-12-09 DIAGNOSIS — C44729 Squamous cell carcinoma of skin of left lower limb, including hip: Secondary | ICD-10-CM | POA: Diagnosis not present

## 2023-12-09 DIAGNOSIS — L0390 Cellulitis, unspecified: Secondary | ICD-10-CM | POA: Diagnosis not present

## 2023-12-15 ENCOUNTER — Ambulatory Visit (INDEPENDENT_AMBULATORY_CARE_PROVIDER_SITE_OTHER)

## 2023-12-15 DIAGNOSIS — I83893 Varicose veins of bilateral lower extremities with other complications: Secondary | ICD-10-CM | POA: Diagnosis not present

## 2023-12-16 DIAGNOSIS — L0390 Cellulitis, unspecified: Secondary | ICD-10-CM | POA: Diagnosis not present

## 2023-12-22 DIAGNOSIS — D124 Benign neoplasm of descending colon: Secondary | ICD-10-CM | POA: Diagnosis not present

## 2023-12-28 ENCOUNTER — Other Ambulatory Visit: Payer: Self-pay | Admitting: Cardiovascular Disease

## 2023-12-28 DIAGNOSIS — I517 Cardiomegaly: Secondary | ICD-10-CM

## 2023-12-28 DIAGNOSIS — E785 Hyperlipidemia, unspecified: Secondary | ICD-10-CM

## 2023-12-28 DIAGNOSIS — I2510 Atherosclerotic heart disease of native coronary artery without angina pectoris: Secondary | ICD-10-CM

## 2023-12-28 DIAGNOSIS — Z720 Tobacco use: Secondary | ICD-10-CM

## 2023-12-28 DIAGNOSIS — Z7982 Long term (current) use of aspirin: Secondary | ICD-10-CM

## 2023-12-28 DIAGNOSIS — I77810 Thoracic aortic ectasia: Secondary | ICD-10-CM

## 2023-12-28 DIAGNOSIS — I10 Essential (primary) hypertension: Secondary | ICD-10-CM

## 2024-01-04 DIAGNOSIS — I2510 Atherosclerotic heart disease of native coronary artery without angina pectoris: Secondary | ICD-10-CM | POA: Diagnosis not present

## 2024-01-04 DIAGNOSIS — I517 Cardiomegaly: Secondary | ICD-10-CM | POA: Diagnosis not present

## 2024-01-04 DIAGNOSIS — I77810 Thoracic aortic ectasia: Secondary | ICD-10-CM | POA: Diagnosis not present

## 2024-01-05 ENCOUNTER — Ambulatory Visit (INDEPENDENT_AMBULATORY_CARE_PROVIDER_SITE_OTHER): Admitting: Nurse Practitioner

## 2024-01-05 ENCOUNTER — Encounter (INDEPENDENT_AMBULATORY_CARE_PROVIDER_SITE_OTHER): Payer: Self-pay | Admitting: Nurse Practitioner

## 2024-01-05 VITALS — BP 148/87 | HR 68 | Resp 16 | Ht 67.0 in | Wt 155.0 lb

## 2024-01-05 DIAGNOSIS — I83893 Varicose veins of bilateral lower extremities with other complications: Secondary | ICD-10-CM | POA: Diagnosis not present

## 2024-01-05 DIAGNOSIS — S81802A Unspecified open wound, left lower leg, initial encounter: Secondary | ICD-10-CM

## 2024-01-05 DIAGNOSIS — E785 Hyperlipidemia, unspecified: Secondary | ICD-10-CM

## 2024-01-05 NOTE — Progress Notes (Signed)
 SUBJECTIVE:  Patient ID: Caroline Harris, female    DOB: 1946/01/21, 78 y.o.   MRN: [id number] Chief Complaint  Patient presents with   Follow-up    4 and 8 week post SSV lasers  Patient question of vasculera from dermatology has one week left of this medication . Patient concern of tightness in ankles    Discussed the use of AI scribe software for clinical note transcription with the patient, who gave verbal consent to proceed.  History of Present Illness Caroline Harris is a 78 year old female with venous insufficiency who presents for follow-up after laser treatment on her legs.She had her right lower extremity SSV done on 12/08/2023 and the left on 11/10/2023. Her legs are feeling good overall after the laser treatment. She experiences tightness around her ankles when sitting and wiggling her feet, but there is no significant pain. Swelling in her legs has improved significantly.  She is using a steroid ointment for a skin cancer on her left leg, which stains everything. The duration of use is unspecified.  She was prescribed Vasculara by her dermatologist and received a sample pack for a month, but is unsure about long-term use and availability.   She wears compression hose to aid in the healing of a wound from skin cancer removal. Previously, she wore an Radio broadcast assistant for two weeks. She is taking weekly pictures to monitor progress but has not shared these with her dermatologist due to lack of a compatible system.     Results RADIOLOGY Lower extremity ultrasound: Successful procedure, no thrombi detected.  Past Medical History:  Diagnosis Date   Adenomatous polyp of cecum    Aortic atherosclerosis    Arthritis    In fingers   Breast calcifications    CAD (coronary artery disease)    Cataract March, 2023   Will have removed   Cirrhosis, non-alcoholic (HCC)    Colitis    Collagenous colitis    COPD, moderate (HCC) 08/23/2015   Diverticulitis    Diverticulitis 03/29/2021    Febrile seizure (HCC)    as infant   Headache    History of CVA (cerebrovascular accident)    Hyperlipidemia    Lumbar back pain    Mild cardiomegaly    Mild intermittent asthma    New persistent daily headache    Osteopenia    Rectal polyp    Restless leg    Splenic artery aneurysm    Splenic artery aneurysm    Varicose veins of leg with swelling, bilateral    Wears dentures    partial lower    Past Surgical History:  Procedure Laterality Date   ABDOMINAL HYSTERECTOMY     CATARACT EXTRACTION W/PHACO Left 10/09/2021   Procedure: CATARACT EXTRACTION PHACO AND INTRAOCULAR LENS PLACEMENT (IOC) LEFT 8.38 01:09.7;  Surgeon: Mittie Gaskin, MD;  Location: [facility];  Service: Ophthalmology;  Laterality: Left;   CATARACT EXTRACTION W/PHACO Right 10/23/2021   Procedure: CATARACT EXTRACTION PHACO AND INTRAOCULAR LENS PLACEMENT (IOC) RIGHT 5.51 00:48.8;  Surgeon: Mittie Gaskin, MD;  Location: [facility];  Service: Ophthalmology;  Laterality: Right;   COLONOSCOPY     COLONOSCOPY N/A 12/04/2023   Procedure: COLONOSCOPY;  Surgeon: Onita Elspeth Sharper, DO;  Location: [facility];  Service: Gastroenterology;  Laterality: N/A;   COLONOSCOPY WITH PROPOFOL  N/A 11/25/2022   Procedure: COLONOSCOPY WITH PROPOFOL ;  Surgeon: Unk Corinn Skiff, MD;  Location: [facility];  Service: Endoscopy;  Laterality: N/A;   EYE SURGERY  POLYPECTOMY  11/25/2022   Procedure: POLYPECTOMY;  Surgeon: Unk Corinn Skiff, MD;  Location: [facility];  Service: Endoscopy;;   TONSILLECTOMY     TUBAL LIGATION      Social History   Socioeconomic History   Marital status: Married    Spouse name: Macario   Number of children: 3   Years of education: Not on file   Highest education level: Bachelor's degree (e.g., BA, AB, BS)  Occupational History    Comment: part time music teacher at [employer]   Occupation: retired  Tobacco Use   Smoking status:  Every Day    Current packs/day: 0.50    Average packs/day: 0.5 packs/day for 59.9 years (29.9 ttl pk-yrs)    Types: Cigarettes    Start date: 1966    Passive exposure: Past   Smokeless tobacco: Never   Tobacco comments:    aware needs to stop   Vaping Use   Vaping status: Never Used  Substance and Sexual Activity   Alcohol use: Not Currently    Comment: 1 glass of wine monthly or less   Drug use: No   Sexual activity: Not Currently    Birth control/protection: Abstinence, Surgical    Comment: Hysterectomy  Other Topics Concern   Not on file  Social History Narrative   Not on file   Social Drivers of Health   Financial Resource Strain: Low Risk  (12/25/2023)   Received from [facility]   Overall Financial Resource Strain (CARDIA)    Difficulty of Paying Living Expenses: Not hard at all  Food Insecurity: No Food Insecurity (12/25/2023)   Received from [facility]   Hunger Vital Sign    Within the past 12 months, you worried that your food would run out before you got the money to buy more.: Never true    Within the past 12 months, the food you bought just didn't last and you didn't have money to get more.: Never true  Transportation Needs: No Transportation Needs (12/25/2023)   Received from [facility] - Transportation    In the past 12 months, has lack of transportation kept you from medical appointments or from getting medications?: No    Lack of Transportation (Non-Medical): No  Physical Activity: Inactive (06/03/2023)   Exercise Vital Sign    Days of Exercise per Week: 0 days    Minutes of Exercise per Session: 0 min  Stress: No Stress Concern Present (06/03/2023)   [employer] - Occupational Stress Questionnaire    Feeling of Stress : Not at all  Social Connections: Socially Integrated (06/03/2023)   Social Connection and Isolation Panel    Frequency of Communication with Friends  and Family: More than three times a week    Frequency of Social Gatherings with Friends and Family: More than three times a week    Attends Religious Services: More than 4 times per year    Active Member of [employer] or Organizations: Yes    Attends Banker Meetings: More than 4 times per year    Marital Status: Married  Catering Manager Violence: Not At Risk (06/03/2023)   Humiliation, Afraid, Rape, and Kick questionnaire    Fear of Current or Ex-Partner: No    Emotionally Abused: No    Physically Abused: No    Sexually Abused: No    Family History  Problem Relation Age of Onset   Breast cancer Mother  50   Cancer Mother    Miscarriages / Stillbirths Mother    Heart attack Father    Heart disease Father     No Known Allergies   Review of Systems   Review of Systems: Negative Unless Checked Constitutional: [] Weight loss  [] Fever  [] Chills Cardiac: [] Chest pain   []  Atrial Fibrillation  [] Palpitations   [] Shortness of breath when laying flat   [] Shortness of breath with exertion. [] Shortness of breath at rest Vascular:  [] Pain in legs with walking   [] Pain in legs with standing [] Pain in legs when laying flat   [] Claudication    [] Pain in feet when laying flat    [] History of DVT   [] Phlebitis   [] Swelling in legs   [] Varicose veins   [] Non-healing ulcers Pulmonary:   [] Uses home oxygen    [] Productive cough   [] Hemoptysis   [] Wheeze  [] COPD   [] Asthma Neurologic:  [] Dizziness   [] Seizures  [] Blackouts [] History of stroke   [] History of TIA  [] Aphasia   [] Temporary Blindness   [] Weakness or numbness in arm   [] Weakness or numbness in leg Musculoskeletal:   [] Joint swelling   [] Joint pain   [] Low back pain  []  History of Knee Replacement [] Arthritis [] back Surgeries  []  Spinal Stenosis    Hematologic:  [] Easy bruising  [] Easy bleeding   [] Hypercoagulable state   [] Anemic Gastrointestinal:  [] Diarrhea   [] Vomiting  [] Gastroesophageal reflux/heartburn   [] Difficulty swallowing.  [] Abdominal pain Genitourinary:  [] Chronic kidney disease   [] Difficult urination  [] Anuric   [] Blood in urine [] Frequent urination  [] Burning with urination   [] Hematuria Skin:  [] Rashes   [] Ulcers [x] Wounds Psychological:  [] History of anxiety   []  History of major depression  []  Memory Difficulties      OBJECTIVE:     BP (!) 148/87   Pulse 68   Resp 16   Ht 5' 7 (1.702 m)   Wt 155 lb (70.3 kg)   BMI 24.28 kg/m   Physical Exam Vitals reviewed.  HENT:     Head: Normocephalic.  Cardiovascular:     Rate and Rhythm: Normal rate.     Pulses: Normal pulses.  Pulmonary:     Effort: Pulmonary effort is normal.  Musculoskeletal:     Right lower leg: No edema.     Left lower leg: No edema.  Skin:    General: Skin is warm and dry.  Neurological:     Mental Status: She is alert and oriented to person, place, and time.  Psychiatric:        Mood and Affect: Mood normal.        Behavior: Behavior normal.        Thought Content: Thought content normal.        Judgment: Judgment normal.    Physical Exam CARDIOVASCULAR: 2+ Peripheral pulses palpable EXTREMITIES: Legs appear normal with minial dema.   CMP     Component Value Date/Time   NA 141 03/23/2023 0836   K 4.5 03/23/2023 0836   CL 102 03/23/2023 0836   CO2 24 03/23/2023 0836   GLUCOSE 86 03/23/2023 0836   GLUCOSE 102 (H) 03/31/2021 0358   BUN 14 03/23/2023 0836   CREATININE 0.64 03/23/2023 0836   CALCIUM  9.4 03/23/2023 0836   PROT 5.9 (L) 03/23/2023 0836   ALBUMIN 3.8 03/23/2023 0836   AST 22 03/23/2023 0836   ALT 13 03/23/2023 0836   ALKPHOS 47 03/23/2023 0836   BILITOT 0.2 03/23/2023 0836   EGFR  90 03/23/2023 0836   GFRNONAA >60 03/31/2021 0358    No results found.     ASSESSMENT AND PLAN:  1. Varicose veins of leg with swelling, bilateral (Primary) Chronic venous insufficiency of lower extremities, post-ablation Chronic venous insufficiency post-ablation. No significant spider or varicose veins.  Swelling improved. Discussed Vasculera use, necessity questioned post-ablation. - Continue wearing compression stockings. - Monitor for new veins or swelling. -PRN follow up  2. Leg wound, left, initial encounter Chronic leg wound after skin cancer removal Chronic leg wound post-skin cancer removal, slow to heal. Discussed potential need for second opinion and referral to wound care center. - Continue wearing compression stockings. - Consider referral to wound care center if healing remains slow.  3. Hyperlipidemia, unspecified hyperlipidemia type Continue statin as ordered and reviewed, no changes at this time      Current Outpatient Medications on File Prior to Visit  Medication Sig Dispense Refill   aspirin EC 81 MG tablet Take 81 mg by mouth daily. Swallow whole.     Baclofen 5 MG TABS Take 5 mg by mouth as needed (for headache rescue).     cholecalciferol (VITAMIN D3) 10 MCG (400 UNIT) TABS tablet Take 400 Units by mouth.     colestipol (COLESTID) 1 g tablet Take 1 g by mouth daily.     losartan (COZAAR) 25 MG tablet Take 25 mg by mouth 2 (two) times daily.     MAGNESIUM GLYCINATE PLUS PO Take 400 mg by mouth at bedtime.     Melatonin 10 MG TABS Take 1 tablet by mouth at bedtime.     montelukast  (SINGULAIR ) 10 MG tablet TAKE (1) TABLET BY MOUTH EVERY DAY 90 tablet 3   Multiple Vitamins-Minerals (CENTRUM SILVER ADULT 50+ PO) Take 1 capsule by mouth daily at 6 (six) AM.     traZODone  (DESYREL ) 50 MG tablet Take 0.5-1 tablets (25-50 mg total) by mouth at bedtime as needed for sleep. 30 tablet 3   ALPRAZolam  (XANAX ) 0.5 MG tablet Take 1st tablet 1 hour before procedure then take 2nd tablet once arrived at the office (Patient not taking: Reported on 12/04/2023) 2 tablet 0   ALPRAZolam  (XANAX ) 0.5 MG tablet Take 1st tablet 1 hour before procedure then take 2nd tablet once arrived at the office (Patient not taking: Reported on 12/08/2023) 2 tablet 0   lovastatin  (MEVACOR ) 20 MG tablet  TAKE (1) TABLET BY MOUTH EVERY DAY 90 tablet 1   No current facility-administered medications on file prior to visit.    There are no Patient Instructions on file for this visit. No follow-ups on file.   Terry Abila E Amran Malter, NP  This note was completed with Office Manager.  Any errors are purely unintentional.

## 2024-01-06 DIAGNOSIS — Z859 Personal history of malignant neoplasm, unspecified: Secondary | ICD-10-CM | POA: Diagnosis not present

## 2024-01-12 ENCOUNTER — Encounter (HOSPITAL_COMMUNITY): Payer: Self-pay

## 2024-01-18 ENCOUNTER — Ambulatory Visit
Admission: RE | Admit: 2024-01-18 | Discharge: 2024-01-18 | Disposition: A | Source: Ambulatory Visit | Attending: Cardiovascular Disease | Admitting: Cardiovascular Disease

## 2024-01-18 DIAGNOSIS — I77810 Thoracic aortic ectasia: Secondary | ICD-10-CM | POA: Diagnosis not present

## 2024-01-18 DIAGNOSIS — E785 Hyperlipidemia, unspecified: Secondary | ICD-10-CM | POA: Insufficient documentation

## 2024-01-18 DIAGNOSIS — I10 Essential (primary) hypertension: Secondary | ICD-10-CM | POA: Insufficient documentation

## 2024-01-18 DIAGNOSIS — Z7982 Long term (current) use of aspirin: Secondary | ICD-10-CM | POA: Insufficient documentation

## 2024-01-18 DIAGNOSIS — I2510 Atherosclerotic heart disease of native coronary artery without angina pectoris: Secondary | ICD-10-CM | POA: Insufficient documentation

## 2024-01-18 DIAGNOSIS — Z720 Tobacco use: Secondary | ICD-10-CM | POA: Insufficient documentation

## 2024-01-18 DIAGNOSIS — I517 Cardiomegaly: Secondary | ICD-10-CM | POA: Diagnosis not present

## 2024-01-18 MED ORDER — NITROGLYCERIN 0.4 MG SL SUBL
0.8000 mg | SUBLINGUAL_TABLET | Freq: Once | SUBLINGUAL | Status: AC
  Administered 2024-01-18: 0.8 mg via SUBLINGUAL
  Filled 2024-01-18: qty 25

## 2024-01-18 MED ORDER — DILTIAZEM HCL 25 MG/5ML IV SOLN
10.0000 mg | INTRAVENOUS | Status: DC | PRN
  Filled 2024-01-18: qty 5

## 2024-01-18 MED ORDER — METOPROLOL TARTRATE 5 MG/5ML IV SOLN
10.0000 mg | Freq: Once | INTRAVENOUS | Status: DC | PRN
  Filled 2024-01-18: qty 10

## 2024-01-18 MED ORDER — IOHEXOL 350 MG/ML SOLN
100.0000 mL | Freq: Once | INTRAVENOUS | Status: AC | PRN
  Administered 2024-01-18: 100 mL via INTRAVENOUS

## 2024-01-18 NOTE — Progress Notes (Signed)
 Patient tolerated CT well. Vital signs stable encourage to drink water throughout day.Reasons explained and verbalized understanding. Ambulated steady gait.

## 2024-01-28 ENCOUNTER — Ambulatory Visit
Admission: RE | Admit: 2024-01-28 | Discharge: 2024-01-28 | Disposition: A | Discharge status: Home / Self Care | Source: Ambulatory Visit | Attending: Physician Assistant | Admitting: Physician Assistant

## 2024-01-28 ENCOUNTER — Other Ambulatory Visit: Payer: Self-pay | Admitting: Physician Assistant

## 2024-01-28 ENCOUNTER — Encounter: Payer: Self-pay | Admitting: Physician Assistant

## 2024-01-28 DIAGNOSIS — S32591A Other specified fracture of right pubis, initial encounter for closed fracture: Secondary | ICD-10-CM

## 2024-06-08 ENCOUNTER — Ambulatory Visit
